# Patient Record
Sex: Female | Born: 1955 | Race: White | Hispanic: No | Marital: Married | Smoking: Never smoker
Health system: Southern US, Community
[De-identification: ages and names within clinical notes are randomized; demographics above are authoritative.]

## PROBLEM LIST (undated history)

## (undated) DIAGNOSIS — IMO0001 Reserved for inherently not codable concepts without codable children: Secondary | ICD-10-CM

## (undated) DIAGNOSIS — E119 Type 2 diabetes mellitus without complications: Secondary | ICD-10-CM

## (undated) DIAGNOSIS — Z9989 Dependence on other enabling machines and devices: Principal | ICD-10-CM

## (undated) DIAGNOSIS — M1990 Unspecified osteoarthritis, unspecified site: Secondary | ICD-10-CM

## (undated) DIAGNOSIS — I10 Essential (primary) hypertension: Secondary | ICD-10-CM

## (undated) DIAGNOSIS — G5602 Carpal tunnel syndrome, left upper limb: Principal | ICD-10-CM

## (undated) DIAGNOSIS — R112 Nausea with vomiting, unspecified: Secondary | ICD-10-CM

## (undated) DIAGNOSIS — G4733 Obstructive sleep apnea (adult) (pediatric): Secondary | ICD-10-CM

## (undated) DIAGNOSIS — I639 Cerebral infarction, unspecified: Secondary | ICD-10-CM

## (undated) DIAGNOSIS — K219 Gastro-esophageal reflux disease without esophagitis: Secondary | ICD-10-CM

## (undated) HISTORY — DX: Carpal tunnel syndrome, left upper limb: G56.02

## (undated) HISTORY — PX: ABDOMINAL HYSTERECTOMY: SHX81

## (undated) HISTORY — PX: APPENDECTOMY: SHX54

## (undated) HISTORY — DX: Cerebral infarction, unspecified: I63.9

## (undated) HISTORY — DX: Obstructive sleep apnea (adult) (pediatric): G47.33

## (undated) HISTORY — PX: TONSILLECTOMY: SUR1361

## (undated) HISTORY — PX: OTHER SURGICAL HISTORY: SHX169

## (undated) HISTORY — DX: Dependence on other enabling machines and devices: Z99.89

## (undated) HISTORY — PX: EYE SURGERY: SHX253

---

## 2012-07-02 ENCOUNTER — Encounter (HOSPITAL_BASED_OUTPATIENT_CLINIC_OR_DEPARTMENT_OTHER): Payer: Self-pay | Admitting: *Deleted

## 2012-07-02 ENCOUNTER — Emergency Department (HOSPITAL_BASED_OUTPATIENT_CLINIC_OR_DEPARTMENT_OTHER)
Admission: EM | Admit: 2012-07-02 | Discharge: 2012-07-02 | Disposition: A | Discharge status: Home / Self Care | Payer: BC Managed Care – PPO | Attending: Emergency Medicine | Admitting: Emergency Medicine

## 2012-07-02 ENCOUNTER — Emergency Department (HOSPITAL_BASED_OUTPATIENT_CLINIC_OR_DEPARTMENT_OTHER): Payer: BC Managed Care – PPO

## 2012-07-02 DIAGNOSIS — Z79899 Other long term (current) drug therapy: Secondary | ICD-10-CM | POA: Insufficient documentation

## 2012-07-02 DIAGNOSIS — E119 Type 2 diabetes mellitus without complications: Secondary | ICD-10-CM | POA: Insufficient documentation

## 2012-07-02 DIAGNOSIS — R1011 Right upper quadrant pain: Secondary | ICD-10-CM | POA: Insufficient documentation

## 2012-07-02 DIAGNOSIS — K219 Gastro-esophageal reflux disease without esophagitis: Secondary | ICD-10-CM | POA: Insufficient documentation

## 2012-07-02 DIAGNOSIS — R197 Diarrhea, unspecified: Secondary | ICD-10-CM | POA: Insufficient documentation

## 2012-07-02 DIAGNOSIS — R110 Nausea: Secondary | ICD-10-CM | POA: Insufficient documentation

## 2012-07-02 HISTORY — DX: Gastro-esophageal reflux disease without esophagitis: K21.9

## 2012-07-02 HISTORY — DX: Reserved for inherently not codable concepts without codable children: IMO0001

## 2012-07-02 HISTORY — DX: Type 2 diabetes mellitus without complications: E11.9

## 2012-07-02 LAB — URINALYSIS, ROUTINE W REFLEX MICROSCOPIC
Leukocytes, UA: NEGATIVE
Protein, ur: NEGATIVE mg/dL
Urobilinogen, UA: 0.2 mg/dL (ref 0.0–1.0)

## 2012-07-02 LAB — COMPREHENSIVE METABOLIC PANEL
ALT: 17 U/L (ref 0–35)
BUN: 13 mg/dL (ref 6–23)
Calcium: 9.7 mg/dL (ref 8.4–10.5)
Creatinine, Ser: 0.9 mg/dL (ref 0.50–1.10)
GFR calc Af Amer: 80 mL/min — ABNORMAL LOW (ref >90)
GFR calc non Af Amer: 69 mL/min — ABNORMAL LOW (ref >90)
Glucose, Bld: 126 mg/dL — ABNORMAL HIGH (ref 70–99)
Sodium: 139 mEq/L (ref 135–145)
Total Protein: 7.2 g/dL (ref 6.0–8.3)

## 2012-07-02 LAB — GLUCOSE, CAPILLARY: Glucose-Capillary: 134 mg/dL — ABNORMAL HIGH (ref 70–99)

## 2012-07-02 LAB — CBC WITH DIFFERENTIAL/PLATELET
Eosinophils Absolute: 0.2 10*3/uL (ref 0.0–0.7)
Eosinophils Relative: 2 % (ref 0–5)
HCT: 45.4 % (ref 36.0–46.0)
Lymphs Abs: 1.2 10*3/uL (ref 0.7–4.0)
MCH: 31.7 pg (ref 26.0–34.0)
MCV: 94.8 fL (ref 78.0–100.0)
Monocytes Absolute: 0.8 10*3/uL (ref 0.1–1.0)
Platelets: 159 10*3/uL (ref 150–400)
RBC: 4.79 MIL/uL (ref 3.87–5.11)

## 2012-07-02 LAB — LIPASE, BLOOD: Lipase: 31 U/L (ref 11–59)

## 2012-07-02 MED ORDER — MORPHINE SULFATE 4 MG/ML IJ SOLN
4.0000 mg | Freq: Once | INTRAMUSCULAR | Status: AC
  Administered 2012-07-02: 4 mg via INTRAVENOUS
  Filled 2012-07-02: qty 1

## 2012-07-02 MED ORDER — HYDROCODONE-ACETAMINOPHEN 5-325 MG PO TABS: 1.0000 | ORAL_TABLET | ORAL | Status: DC | PRN

## 2012-07-02 MED ORDER — SODIUM CHLORIDE 0.9 % IV BOLUS (SEPSIS)
1000.0000 mL | Freq: Once | INTRAVENOUS | Status: AC
  Administered 2012-07-02: 1000 mL via INTRAVENOUS

## 2012-07-02 MED ORDER — ONDANSETRON 4 MG PO TBDP: ORAL_TABLET | ORAL | Status: DC

## 2012-07-02 MED ORDER — ONDANSETRON HCL 4 MG/2ML IJ SOLN
4.0000 mg | Freq: Once | INTRAMUSCULAR | Status: AC
  Administered 2012-07-02: 4 mg via INTRAVENOUS
  Filled 2012-07-02: qty 2

## 2012-07-02 NOTE — ED Notes (Signed)
Mid upper abd pain and diarrhea onset last p.m.  Has taken max on Imodium.

## 2012-07-02 NOTE — ED Provider Notes (Signed)
History  This chart was scribed for Loren Racer, MD by Shari Heritage, ED Scribe. The patient was seen in room MH12/MH12. Patient's care was started at 1901.   CSN: [id number]  Arrival date & time 07/02/12  4098   First MD Initiated Contact with Patient 07/02/12 1901      Chief Complaint  Patient presents with  . Abdominal Pain    Patient is a 57 y.o. female presenting with abdominal pain. The history is provided by the patient. No language interpreter was used.  Abdominal Pain Pain location:  RUQ Pain radiates to:  Does not radiate Pain severity:  Moderate Duration:  1 day Timing:  Constant Progression:  Unchanged Chronicity:  New Relieved by:  Nothing Ineffective treatments:  Bowel activity Associated symptoms: diarrhea and nausea   Associated symptoms: no chills, no fever and no vomiting      HPI Comments: Caroline [name] is a 57 y.o. female with history of diabetes and reflux who presents to the Emergency Department complaining of moderate, unchanged, non-radiating, constant right upper quadrant  abdominal pain onset last night. Patient says that current pain is not similar to past reflux pain. There is associated nausea and diarrhea. Patient states that she has had an episode of diarrhea almost every hours since last night, but last episode was 3 hours ago. There is no blood in stool, fever, chills or vomiting. Pain is not improved after bowel movements. She states that she ate a salad from [facility] last night prior to pain onset. She has a surgical history of abdominal hysterectomy and appendectomy. No history of cholecystectomy. She does not smoke.  Past Medical History  Diagnosis Date  . Diabetes mellitus without complication   . Reflux     Past Surgical History  Procedure Laterality Date  . Abdominal hysterectomy    . Appendectomy      History reviewed. No pertinent family history.  History  Substance Use Topics  . Smoking status: Never Smoker   . Smokeless  tobacco: Not on file  . Alcohol Use: No    OB History   Grav Para Term Preterm Abortions TAB SAB Ect Mult Living                  Review of Systems  Constitutional: Negative for fever and chills.  Gastrointestinal: Positive for nausea, abdominal pain and diarrhea. Negative for vomiting and blood in stool.  All other systems reviewed and are negative.    Allergies  Review of patient's allergies indicates no known allergies.  Home Medications   Current Outpatient Rx  Name  Route  Sig  Dispense  Refill  . estradiol (CLIMARA - DOSED IN MG/24 HR) 0.025 mg/24hr   Transdermal   Place 1 patch onto the skin once a week.         Marland Kitchen omeprazole (PRILOSEC) 40 MG capsule   Oral   Take 40 mg by mouth daily.         . valACYclovir (VALTREX) 500 MG tablet   Oral   Take 500 mg by mouth 2 (two) times daily.           Triage Vitals: BP 122/79  Pulse 118  Temp(Src) 98.3 F (36.8 C) (Oral)  Resp 20  Ht 5' 3.5" (1.613 m)  Wt 187 lb (84.823 kg)  BMI 32.6 kg/m2  SpO2 95%  Physical Exam  Constitutional: She is oriented to person, place, and time. She appears well-developed and well-nourished.  HENT:  Head: Normocephalic and  atraumatic.  Eyes: Conjunctivae and EOM are normal. Pupils are equal, round, and reactive to light.  Neck: Normal range of motion. Neck supple.  Cardiovascular: Normal rate and regular rhythm.   Pulmonary/Chest: Effort normal and breath sounds normal.  Abdominal: Soft. There is tenderness in the right upper quadrant. There is no rebound and no guarding.  Musculoskeletal: Normal range of motion.  Neurological: She is alert and oriented to person, place, and time.  Skin: Skin is warm and dry.  Psychiatric: She has a normal mood and affect. Her behavior is normal.    ED Course  Procedures (including critical care time) DIAGNOSTIC STUDIES: Oxygen Saturation is 95% on room air, adequate by my interpretation.    COORDINATION OF CARE: 7:25 PM- Will give IV  fluids, morphine and Zofran. Will order CBC with diff, comp metabolic panel, lipase, glucose, UA and US abdomen. Patient informed of current plan for treatment and evaluation and agrees with plan at this time.   9:53 PM- Patient is feeling better. Was updated on results of labs and scans. Patient has been reasonably evaluated and is stable for discharge. Patient agrees.     Labs Reviewed  URINALYSIS, ROUTINE W REFLEX MICROSCOPIC - Abnormal; Notable for the following:    Color, Urine AMBER (*)    APPearance CLOUDY (*)    Bilirubin Urine SMALL (*)    All other components within normal limits  GLUCOSE, CAPILLARY - Abnormal; Notable for the following:    Glucose-Capillary 134 (*)    All other components within normal limits  CBC WITH DIFFERENTIAL - Abnormal; Notable for the following:    Hemoglobin 15.2 (*)    All other components within normal limits  COMPREHENSIVE METABOLIC PANEL - Abnormal; Notable for the following:    Glucose, Bld 126 (*)    GFR calc non Af Amer 69 (*)    GFR calc Af Amer 80 (*)    All other components within normal limits  LIPASE, BLOOD    US Abdomen Complete  07/02/2012  *RADIOLOGY REPORT*  Clinical Data:  Right upper quadrant pain.  Abdominal pain and diarrhea.  COMPLETE ABDOMINAL ULTRASOUND  Comparison:  None.  Findings:  Gallbladder:  There is slightly echogenic bile.  No evidence of gallstones.  Gallbladder wall thickness is normal.  No pericholecystic fluid.  No Murphy's sign.  Common bile duct:  Normal at 4 mm.  Liver:  Somewhat echogenic suggesting fatty change.  No focal lesion or biliary ductal dilatation.  IVC:  Normal  Pancreas:  Poorly seen because of overlying bowel gas.  Spleen:  Normal at 8.7 cm.  Right Kidney:  Normal at 10.9 cm.  Normal echogenicity.  No cyst, mass, stone or hydronephrosis.  Left Kidney:  Similarly normal at 10.4 cm.  Abdominal aorta:  Normal.  No aneurysm.  No ascites  IMPRESSION: Probably no significant finding.  Slightly echogenic  bile, usually not pathologic finding.  No sonographic evidence of stones, cholecystitis or obstruction.  Some fatty change of the liver without focal lesion.  Poor visualization of the pancreas because of overlying bowel gas.   Original Report Authenticated By: Paulina Fusi, M.D.      No diagnosis found.    MDM  I personally performed the services described in this documentation, which was scribed in my presence. The recorded information has been reviewed and is accurate.    Loren Racer, MD 07/02/12 [phone]

## 2014-11-01 ENCOUNTER — Other Ambulatory Visit: Payer: Self-pay | Admitting: Neurosurgery

## 2014-11-01 DIAGNOSIS — M5412 Radiculopathy, cervical region: Secondary | ICD-10-CM

## 2014-11-12 ENCOUNTER — Ambulatory Visit
Admission: RE | Admit: 2014-11-12 | Discharge: 2014-11-12 | Disposition: A | Discharge status: Home / Self Care | Payer: 59 | Source: Ambulatory Visit | Attending: Neurosurgery | Admitting: Neurosurgery

## 2014-11-12 DIAGNOSIS — M5412 Radiculopathy, cervical region: Secondary | ICD-10-CM

## 2014-11-12 MED ORDER — TRIAMCINOLONE ACETONIDE 40 MG/ML IJ SUSP (RADIOLOGY)
60.0000 mg | Freq: Once | INTRAMUSCULAR | Status: AC
  Administered 2014-11-12: 60 mg via EPIDURAL

## 2014-11-12 MED ORDER — IOHEXOL 300 MG/ML  SOLN
1.0000 mL | Freq: Once | INTRAMUSCULAR | Status: AC | PRN
  Administered 2014-11-12: 1 mL via EPIDURAL

## 2014-11-12 NOTE — Discharge Instructions (Signed)

## 2016-01-02 ENCOUNTER — Emergency Department (HOSPITAL_COMMUNITY): Payer: BLUE CROSS/BLUE SHIELD

## 2016-01-02 ENCOUNTER — Emergency Department (HOSPITAL_COMMUNITY): Payer: BLUE CROSS/BLUE SHIELD | Admitting: Certified Registered Nurse Anesthetist

## 2016-01-02 ENCOUNTER — Encounter (HOSPITAL_COMMUNITY): Payer: Self-pay | Admitting: Radiology

## 2016-01-02 ENCOUNTER — Encounter (HOSPITAL_COMMUNITY)
Admission: EM | Disposition: A | Discharge status: Home / Self Care | Payer: Self-pay | Source: Home / Self Care | Attending: Neurology

## 2016-01-02 ENCOUNTER — Inpatient Hospital Stay (HOSPITAL_COMMUNITY): Payer: BLUE CROSS/BLUE SHIELD

## 2016-01-02 ENCOUNTER — Inpatient Hospital Stay (HOSPITAL_COMMUNITY)
Admission: EM | Admit: 2016-01-02 | Discharge: 2016-01-08 | DRG: 041 | Disposition: A | Discharge status: Home / Self Care | Payer: BLUE CROSS/BLUE SHIELD | Attending: Neurology | Admitting: Neurology

## 2016-01-02 DIAGNOSIS — R29810 Facial weakness: Secondary | ICD-10-CM | POA: Diagnosis present

## 2016-01-02 DIAGNOSIS — I63412 Cerebral infarction due to embolism of left middle cerebral artery: Principal | ICD-10-CM | POA: Diagnosis present

## 2016-01-02 DIAGNOSIS — Z6830 Body mass index (BMI) 30.0-30.9, adult: Secondary | ICD-10-CM

## 2016-01-02 DIAGNOSIS — R29702 NIHSS score 2: Secondary | ICD-10-CM | POA: Diagnosis present

## 2016-01-02 DIAGNOSIS — E669 Obesity, unspecified: Secondary | ICD-10-CM | POA: Diagnosis present

## 2016-01-02 DIAGNOSIS — E119 Type 2 diabetes mellitus without complications: Secondary | ICD-10-CM | POA: Diagnosis present

## 2016-01-02 DIAGNOSIS — I639 Cerebral infarction, unspecified: Secondary | ICD-10-CM | POA: Diagnosis present

## 2016-01-02 DIAGNOSIS — R4701 Aphasia: Secondary | ICD-10-CM

## 2016-01-02 DIAGNOSIS — I63019 Cerebral infarction due to thrombosis of unspecified vertebral artery: Secondary | ICD-10-CM | POA: Diagnosis not present

## 2016-01-02 DIAGNOSIS — I6789 Other cerebrovascular disease: Secondary | ICD-10-CM | POA: Diagnosis not present

## 2016-01-02 DIAGNOSIS — I10 Essential (primary) hypertension: Secondary | ICD-10-CM | POA: Diagnosis present

## 2016-01-02 DIAGNOSIS — I63312 Cerebral infarction due to thrombosis of left middle cerebral artery: Secondary | ICD-10-CM

## 2016-01-02 DIAGNOSIS — I669 Occlusion and stenosis of unspecified cerebral artery: Secondary | ICD-10-CM | POA: Diagnosis not present

## 2016-01-02 DIAGNOSIS — E785 Hyperlipidemia, unspecified: Secondary | ICD-10-CM | POA: Diagnosis present

## 2016-01-02 DIAGNOSIS — E1159 Type 2 diabetes mellitus with other circulatory complications: Secondary | ICD-10-CM | POA: Diagnosis not present

## 2016-01-02 DIAGNOSIS — I6602 Occlusion and stenosis of left middle cerebral artery: Secondary | ICD-10-CM

## 2016-01-02 DIAGNOSIS — M6281 Muscle weakness (generalized): Secondary | ICD-10-CM

## 2016-01-02 DIAGNOSIS — Q211 Atrial septal defect: Secondary | ICD-10-CM | POA: Diagnosis not present

## 2016-01-02 DIAGNOSIS — Z79890 Hormone replacement therapy (postmenopausal): Secondary | ICD-10-CM

## 2016-01-02 HISTORY — PX: RADIOLOGY WITH ANESTHESIA: SHX6223

## 2016-01-02 HISTORY — PX: IR GENERIC HISTORICAL: IMG1180011

## 2016-01-02 LAB — I-STAT CHEM 8, ED
BUN: 10 mg/dL (ref 6–20)
CREATININE: 0.9 mg/dL (ref 0.44–1.00)
Calcium, Ion: 1.02 mmol/L — ABNORMAL LOW (ref 1.15–1.40)
Chloride: 103 mmol/L (ref 101–111)
GLUCOSE: 154 mg/dL — AB (ref 65–99)
HCT: 48 % — ABNORMAL HIGH (ref 36.0–46.0)
HEMOGLOBIN: 16.3 g/dL — AB (ref 12.0–15.0)
POTASSIUM: 4 mmol/L (ref 3.5–5.1)
Sodium: 138 mmol/L (ref 135–145)
TCO2: 25 mmol/L (ref 0–100)

## 2016-01-02 LAB — CBC
HCT: 47.9 % — ABNORMAL HIGH (ref 36.0–46.0)
HEMOGLOBIN: 15.1 g/dL — AB (ref 12.0–15.0)
MCH: 30.7 pg (ref 26.0–34.0)
MCHC: 31.5 g/dL (ref 30.0–36.0)
MCV: 97.4 fL (ref 78.0–100.0)
Platelets: 169 10*3/uL (ref 150–400)
RBC: 4.92 MIL/uL (ref 3.87–5.11)
RDW: 13.7 % (ref 11.5–15.5)
WBC: 7.2 10*3/uL (ref 4.0–10.5)

## 2016-01-02 LAB — GLUCOSE, CAPILLARY: GLUCOSE-CAPILLARY: 129 mg/dL — AB (ref 65–99)

## 2016-01-02 LAB — COMPREHENSIVE METABOLIC PANEL
ALBUMIN: 3.8 g/dL (ref 3.5–5.0)
ALT: 30 U/L (ref 14–54)
ANION GAP: 11 (ref 5–15)
AST: 27 U/L (ref 15–41)
Alkaline Phosphatase: 63 U/L (ref 38–126)
BILIRUBIN TOTAL: 0.8 mg/dL (ref 0.3–1.2)
BUN: 8 mg/dL (ref 6–20)
CO2: 22 mmol/L (ref 22–32)
Calcium: 9.2 mg/dL (ref 8.9–10.3)
Chloride: 105 mmol/L (ref 101–111)
Creatinine, Ser: 0.89 mg/dL (ref 0.44–1.00)
GFR calc Af Amer: >60 mL/min (ref >60)
Glucose, Bld: 158 mg/dL — ABNORMAL HIGH (ref 65–99)
POTASSIUM: 3.8 mmol/L (ref 3.5–5.1)
Sodium: 138 mmol/L (ref 135–145)
TOTAL PROTEIN: 7 g/dL (ref 6.5–8.1)

## 2016-01-02 LAB — CBG MONITORING, ED
GLUCOSE-CAPILLARY: 117 mg/dL — AB (ref 65–99)
Glucose-Capillary: 190 mg/dL — ABNORMAL HIGH (ref 65–99)

## 2016-01-02 LAB — DIFFERENTIAL
BASOS PCT: 0 %
Basophils Absolute: 0 10*3/uL (ref 0.0–0.1)
EOS ABS: 0.3 10*3/uL (ref 0.0–0.7)
EOS PCT: 4 %
Lymphocytes Relative: 25 %
Lymphs Abs: 1.8 10*3/uL (ref 0.7–4.0)
MONOS PCT: 9 %
Monocytes Absolute: 0.6 10*3/uL (ref 0.1–1.0)
NEUTROS PCT: 62 %
Neutro Abs: 4.6 10*3/uL (ref 1.7–7.7)

## 2016-01-02 LAB — URINALYSIS, ROUTINE W REFLEX MICROSCOPIC
Bilirubin Urine: NEGATIVE
GLUCOSE, UA: NEGATIVE mg/dL
Ketones, ur: 15 mg/dL — AB
Leukocytes, UA: NEGATIVE
Nitrite: NEGATIVE
PH: 6 (ref 5.0–8.0)
Protein, ur: NEGATIVE mg/dL
SPECIFIC GRAVITY, URINE: 1.046 — AB (ref 1.005–1.030)

## 2016-01-02 LAB — RAPID URINE DRUG SCREEN, HOSP PERFORMED
Amphetamines: NOT DETECTED
Barbiturates: NOT DETECTED
Benzodiazepines: NOT DETECTED
Cocaine: NOT DETECTED
OPIATES: NOT DETECTED
Tetrahydrocannabinol: NOT DETECTED

## 2016-01-02 LAB — I-STAT TROPONIN, ED: TROPONIN I, POC: 0 ng/mL (ref 0.00–0.08)

## 2016-01-02 LAB — PROTIME-INR
INR: 0.98
Prothrombin Time: 13 seconds (ref 11.4–15.2)

## 2016-01-02 LAB — URINE MICROSCOPIC-ADD ON

## 2016-01-02 LAB — APTT: aPTT: 24 seconds (ref 24–36)

## 2016-01-02 SURGERY — RADIOLOGY WITH ANESTHESIA
Anesthesia: General

## 2016-01-02 MED ORDER — SODIUM CHLORIDE 0.9 % IV SOLN
INTRAVENOUS | Status: DC
  Administered 2016-01-03: 1000 mL via INTRAVENOUS

## 2016-01-02 MED ORDER — ONDANSETRON HCL 4 MG/2ML IJ SOLN
INTRAMUSCULAR | Status: DC | PRN
  Administered 2016-01-02: 4 mg via INTRAVENOUS

## 2016-01-02 MED ORDER — STROKE: EARLY STAGES OF RECOVERY BOOK
Freq: Once | Status: AC
  Administered 2016-01-02: 19:00:00
  Filled 2016-01-02: qty 1

## 2016-01-02 MED ORDER — LIDOCAINE HCL 1 % IJ SOLN
INTRAMUSCULAR | Status: AC
  Filled 2016-01-02: qty 20

## 2016-01-02 MED ORDER — INSULIN ASPART 100 UNIT/ML ~~LOC~~ SOLN
0.0000 [IU] | Freq: Three times a day (TID) | SUBCUTANEOUS | Status: DC
  Administered 2016-01-03 – 2016-01-08 (×11): 2 [IU] via SUBCUTANEOUS
  Administered 2016-01-08: 3 [IU] via SUBCUTANEOUS

## 2016-01-02 MED ORDER — HEPARIN SODIUM (PORCINE) 1000 UNIT/ML IJ SOLN
INTRAMUSCULAR | Status: DC | PRN
  Administered 2016-01-02: 1000 [IU] via INTRAVENOUS

## 2016-01-02 MED ORDER — HYDROMORPHONE HCL 1 MG/ML IJ SOLN: 0.5000 mg | INTRAMUSCULAR | Status: DC | PRN

## 2016-01-02 MED ORDER — ENOXAPARIN SODIUM 40 MG/0.4ML ~~LOC~~ SOLN
40.0000 mg | SUBCUTANEOUS | Status: DC
  Administered 2016-01-02 – 2016-01-07 (×6): 40 mg via SUBCUTANEOUS
  Filled 2016-01-02 (×6): qty 0.4

## 2016-01-02 MED ORDER — ASPIRIN EC 325 MG PO TBEC
325.0000 mg | DELAYED_RELEASE_TABLET | Freq: Every day | ORAL | Status: DC
  Administered 2016-01-03: 325 mg via ORAL
  Filled 2016-01-02: qty 1

## 2016-01-02 MED ORDER — SODIUM CHLORIDE 0.9 % IV SOLN
INTRAVENOUS | Status: DC | PRN
  Administered 2016-01-02: 15:00:00 via INTRAVENOUS

## 2016-01-02 MED ORDER — IOPAMIDOL (ISOVUE-370) INJECTION 76%
50.0000 mL | Freq: Once | INTRAVENOUS | Status: AC | PRN
  Administered 2016-01-02: 50 mL via INTRAVENOUS

## 2016-01-02 MED ORDER — SODIUM CHLORIDE 0.9 % IV SOLN
INTRAVENOUS | Status: DC
Start: 2016-01-02 — End: 2016-01-03
  Administered 2016-01-02 – 2016-01-03 (×2): via INTRAVENOUS

## 2016-01-02 MED ORDER — IOPAMIDOL (ISOVUE-300) INJECTION 61%
INTRAVENOUS | Status: AC
  Administered 2016-01-02: 50 mL
  Filled 2016-01-02: qty 150

## 2016-01-02 MED ORDER — PROPOFOL 10 MG/ML IV BOLUS
INTRAVENOUS | Status: DC | PRN
  Administered 2016-01-02: 30 mg via INTRAVENOUS

## 2016-01-02 MED ORDER — IOPAMIDOL (ISOVUE-370) INJECTION 76%
INTRAVENOUS | Status: AC
  Filled 2016-01-02: qty 50

## 2016-01-02 MED ORDER — ONDANSETRON HCL 4 MG/2ML IJ SOLN: 4.0000 mg | Freq: Once | INTRAMUSCULAR | Status: DC | PRN

## 2016-01-02 MED ORDER — FENTANYL CITRATE (PF) 100 MCG/2ML IJ SOLN
INTRAMUSCULAR | Status: DC | PRN
  Administered 2016-01-02 (×4): 50 ug via INTRAVENOUS

## 2016-01-02 NOTE — Progress Notes (Signed)
Pt arrived to 5C20 via stretcher.  Pt alert and oriented, no complaints of pain.  VSS.  Pt lying flat in bed, R groin dressing clean,dry and intact.  Pt informed of the need to remain flat until 8 pm.  Patient NPO, swallow screen not done.  Pt wants to wait until she can sit up to do it.  Will pass to oncoming nurse.  Will continue to monitor.  Sondra ComeSilva, Sadiyah Kangas M, RN

## 2016-01-02 NOTE — Procedures (Signed)
4 vessel cerebral arteriogram. RT CFA approach. Findings. 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

## 2016-01-02 NOTE — Anesthesia Procedure Notes (Signed)
Procedure Name: MAC Date/Time: 01/02/2016 2:47 PM Performed by: Wray KearnsFOLEY, Siearra Amberg A Pre-anesthesia Checklist: Patient identified, Emergency Drugs available, Suction available, Patient being monitored and Timeout performed Patient Re-evaluated:Patient Re-evaluated prior to inductionOxygen Delivery Method: Nasal cannula Intubation Type: IV induction Placement Confirmation: positive ETCO2 Dental Injury: Teeth and Oropharynx as per pre-operative assessment

## 2016-01-02 NOTE — Anesthesia Postprocedure Evaluation (Signed)
Anesthesia Post Note  Patient: Caroline [name]  Procedure(s) Performed: Procedure(s) (LRB): RADIOLOGY WITH ANESTHESIA (N/A)  Patient location during evaluation: PACU Anesthesia Type: MAC Level of consciousness: awake, awake and alert, oriented and patient cooperative Pain management: pain level controlled Vital Signs Assessment: post-procedure vital signs reviewed and stable Respiratory status: spontaneous breathing and respiratory function stable Cardiovascular status: stable Anesthetic complications: no    Last Vitals:  Vitals:   01/02/16 1415 01/02/16 1432  BP:  130/83  Pulse:  63  Resp: 22 15  Temp:      Last Pain:  Vitals:   01/02/16 1231  TempSrc: Oral  PainSc:                  Johnathan Heskett EDWARD

## 2016-01-02 NOTE — ED Triage Notes (Signed)
Pt. Husband left around 7AM and around 1030 the secretary noticed that the patient was having some aphasia.

## 2016-01-02 NOTE — Anesthesia Preprocedure Evaluation (Signed)
Anesthesia Evaluation  Patient identified by MRN, date of birth, ID band Patient awake    Reviewed: Allergy & Precautions, NPO status , Patient's Chart, lab work & pertinent test results  Airway Mallampati: I  TM Distance: >3 FB     Dental   Pulmonary    Pulmonary exam normal        Cardiovascular Normal cardiovascular exam     Neuro/Psych CVA    GI/Hepatic   Endo/Other  diabetes, Well Controlled  Renal/GU      Musculoskeletal   Abdominal   Peds  Hematology   Anesthesia Other Findings   Reproductive/Obstetrics                             Anesthesia Physical Anesthesia Plan  ASA: III  Anesthesia Plan: MAC   Post-op Pain Management:    Induction: Intravenous  Airway Management Planned: Mask and Simple Face Mask  Additional Equipment:   Intra-op Plan:   Post-operative Plan:   Informed Consent: I have reviewed the patients History and Physical, chart, labs and discussed the procedure including the risks, benefits and alternatives for the proposed anesthesia with the patient or authorized representative who has indicated his/her understanding and acceptance.     Plan Discussed with: CRNA, Anesthesiologist and Surgeon  Anesthesia Plan Comments:         Anesthesia Quick Evaluation

## 2016-01-02 NOTE — Transfer of Care (Signed)
Immediate Anesthesia Transfer of Care Note  Patient: Caroline [name]  Procedure(s) Performed: Procedure(s): RADIOLOGY WITH ANESTHESIA (N/A)  Patient Location: PACU  Anesthesia Type:MAC  Level of Consciousness: awake, oriented, patient cooperative and responds to stimulation  Airway & Oxygen Therapy: Patient Spontanous Breathing  Post-op Assessment: Report given to RN, Post -op Vital signs reviewed and stable, Patient moving all extremities and Patient moving all extremities X 4  Post vital signs: Reviewed and stable  Last Vitals:  Vitals:   01/02/16 1415 01/02/16 1432  BP:  130/83  Pulse:  63  Resp: 22 15  Temp:      Last Pain:  Vitals:   01/02/16 1231  TempSrc: Oral  PainSc:          Complications: No apparent anesthesia complications

## 2016-01-02 NOTE — Code Documentation (Signed)
60[facility].  Patient from home when she was LKW at 0700 by her husband.  Patient works from home, and Environmental consultantthe secretary noticed at 1030 that she was confused and not making any sense.  EMS was called and activated a code stroke for expressive aphasia.  Stroke team at the bedside on patient arrival.  Patient to CT with team.  Phlebotomy to CT to draw labs.  CT completed.  NIHSS 2, see documentation for details and code stroke times.  Patient with mild expressive aphasia, able to name objects with occasional word substitution as well as described the picture with some loss of fluency.  Patient with mild right facial droop.  CTA imaging completed per MD order.  Patient transported back to the ED.  Patient is outside the window for treatment with tPA.  Dr. Amada JupiterKirkpatrick updated patient and family at the bedside.  CTP imaging ordered.  Unable to place 18G PIV despite multiple attempts by providers and use of ultrasound.  Patient to go to STAT MRI.  Bedside handoff with ED RN Kirt BoysMolly.

## 2016-01-02 NOTE — ED Provider Notes (Signed)
[facility]-EMERGENCY DEPT Provider Note   CSN: [id number] Arrival date & time: 01/02/16  1141     History   Chief Complaint Chief Complaint  Patient presents with  . Code Stroke    HPI Caroline [name] is a 60 y.o. female.  HPI  60 year old female who presents as a code stroke. History of diabetes. States that over the past 2 days she has had nausea, vomiting, and diarrhea. States that she woke up this morning at 10 AM and did not feel right. States that she was having difficulty breathing. States that she went to bed at midnight the night before and was her normal self other than her GI illness. According to EMS, her husband woke up at 7 AM and felt that she was her normal self. Patient works from home, on around 10:30 AM her secretary felt that she was not talking correctly. She had jumbling of her words and difficulty with word finding. Patient denies any numbness, weakness focally, vision changes, chest pain, difficulty breathing, abdominal pain or severe back pain.   Past Medical History:  Diagnosis Date  . Diabetes mellitus without complication (HCC)   . Reflux     There are no active problems to display for this patient.   Past Surgical History:  Procedure Laterality Date  . ABDOMINAL HYSTERECTOMY    . APPENDECTOMY      OB History    No data available       Home Medications    Prior to Admission medications   Medication Sig Start Date End Date Taking? Authorizing Provider  estradiol (CLIMARA - DOSED IN MG/24 HR) 0.025 mg/24hr Place 1 patch onto the skin once a week.    Historical Provider, MD  HYDROcodone-acetaminophen (NORCO) 5-325 MG per tablet Take 1 tablet by mouth every 4 (four) hours as needed for pain. 07/02/12   Loren Racer, MD  omeprazole (PRILOSEC) 40 MG capsule Take 40 mg by mouth daily.    Historical Provider, MD  ondansetron (ZOFRAN ODT) 4 MG disintegrating tablet 4mg  ODT q4 hours prn nausea/vomit 07/02/12   Loren Racer, MD  valACYclovir (VALTREX)  500 MG tablet Take 500 mg by mouth 2 (two) times daily.    Historical Provider, MD    Family History No family history on file.  Social History Social History  Substance Use Topics  . Smoking status: Never Smoker  . Smokeless tobacco: Never Used  . Alcohol use No     Allergies   Review of patient's allergies indicates no known allergies.   Review of Systems Review of Systems 10/14 systems reviewed and are negative other than those stated in the HPI   Physical Exam Updated Vital Signs BP 130/83 (BP Location: Right Arm)   Pulse 63   Temp 97.6 F (36.4 C) (Oral)   Resp 15   SpO2 99%   Physical Exam Physical Exam  Nursing note and vitals reviewed. Constitutional: Well developed, well nourished, non-toxic, and in no acute distress Head: Normocephalic and atraumatic.  Mouth/Throat: Oropharynx is clear and moist.  Neck: Normal range of motion. Neck supple.  Eyes: PERRL, EOMI Cardiovascular: Normal rate and regular rhythm.   Pulmonary/Chest: Effort normal and breath sounds normal.  Abdominal: Soft. There is no tenderness. There is no rebound and no guarding.  Musculoskeletal: Normal range of motion.  Neurological: Alert, no facial droop, expressive aphasia, no dysmetria with finger to nose, full strength of bilaterally upper extremities against gravity, potential subtle 5-/5 strength in RLE against gravity, from LLE  strength against gravity. Sensation to light touch intact in bilateral upper and lower extremities and face, palate move symmetrically, pupils equal reactive to light, extraocular movements intact, temperature is midline Skin: Skin is warm and dry.  Psychiatric: Cooperative   ED Treatments / Results  Labs (all labs ordered are listed, but only abnormal results are displayed) Labs Reviewed  CBC - Abnormal; Notable for the following:       Result Value   Hemoglobin 15.1 (*)    HCT 47.9 (*)    All other components within normal limits  COMPREHENSIVE  METABOLIC PANEL - Abnormal; Notable for the following:    Glucose, Bld 158 (*)    All other components within normal limits  I-STAT CHEM 8, ED - Abnormal; Notable for the following:    Glucose, Bld 154 (*)    Calcium, Ion 1.02 (*)    Hemoglobin 16.3 (*)    HCT 48.0 (*)    All other components within normal limits  CBG MONITORING, ED - Abnormal; Notable for the following:    Glucose-Capillary 190 (*)    All other components within normal limits  PROTIME-INR  APTT  DIFFERENTIAL  ETHANOL  URINE RAPID DRUG SCREEN, HOSP PERFORMED  URINALYSIS, ROUTINE W REFLEX MICROSCOPIC (NOT AT [facility])  I-STAT TROPOININ, ED    EKG  EKG Interpretation  Date/Time:  Thursday January 02 2016 12:16:25 EDT Ventricular Rate:  66 PR Interval:    QRS Duration: 91 QT Interval:  432 QTC Calculation: 436 R Axis:   81 Text Interpretation:  Sinus rhythm Supraventricular bigeminy Borderline right axis deviation Low voltage, extremity and precordial leads Minimal ST elevation, inferior leads no prior EKg  Confirmed by LIU MD, DANA [phone]) on 01/02/2016 2:50:32 PM       Radiology Ct Angio Head W Or Wo Contrast  Result Date: 01/02/2016 CLINICAL DATA:  60 year old diabetic female with dysphaia. Subsequent encounter. EXAM: CT ANGIOGRAPHY HEAD AND NECK TECHNIQUE: Multidetector CT imaging of the head and neck was performed using the standard protocol during bolus administration of intravenous contrast. Multiplanar CT image reconstructions and MIPs were obtained to evaluate the vascular anatomy. Carotid stenosis measurements (when applicable) are obtained utilizing NASCET criteria, using the distal internal carotid diameter as the denominator. CONTRAST:  50 cc Isovue 370. COMPARISON:  None. FINDINGS: CT HEAD Brain: Possible early infarct left subinsular/ operculum region. No intracranial hemorrhage. No intracranial enhancing lesion. Calvarium and skull base: Negative Paranasal sinuses: Partial opacification inferior left  maxillary sinus Orbits: No acute orbital abnormality. CTA NECK Aortic arch: 4 vessel arch with left vertebral artery arising directly from the arch. Right carotid system: No significant stenosis. Left carotid system: No significant stenosis. Vertebral arteries:Left vertebral artery is dominant. Skeleton: Negative. Other neck: No worrisome mass. Upper chest: No worrisome lung apical lesion. CTA HEAD Anterior circulation: No significant stenosis left carotid terminus or M1 segment of the left middle cerebral artery. 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 Fetal origin of the posterior cerebral arteries. Posterior circulation: Right vertebral artery ends in a posterior inferior cerebellar artery distribution. Venous sinuses: Patent Anatomic variants: As above Delayed phase: As above IMPRESSION: CT HEAD Possible early infarct left subinsular/ operculum region. CTA HEAD No significant stenosis left carotid terminus or M1 segment of the left middle cerebral artery. 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 Fetal origin of posterior cerebral arteries. Right vertebral artery ends in a posterior inferior cerebellar artery  distribution. CTA NECK 4 vessel arch with left vertebral artery arising directly from the arch. No significant stenosis of either carotid bifurcation. Images reviewed with Dr. Amada Jupiter at the time of scanning. Electronically Signed   By: Lacy Duverney M.D.   On: 01/02/2016 12:31   Ct Angio Neck W Or Wo Contrast  Result Date: 01/02/2016 CLINICAL DATA:  60 year old diabetic female with dysphaia. Subsequent encounter. EXAM: CT ANGIOGRAPHY HEAD AND NECK TECHNIQUE: Multidetector CT imaging of the head and neck was performed using the standard protocol during bolus administration of intravenous contrast. Multiplanar CT image reconstructions and MIPs were obtained to evaluate the vascular  anatomy. Carotid stenosis measurements (when applicable) are obtained utilizing NASCET criteria, using the distal internal carotid diameter as the denominator. CONTRAST:  50 cc Isovue 370. COMPARISON:  None. FINDINGS: CT HEAD Brain: Possible early infarct left subinsular/ operculum region. No intracranial hemorrhage. No intracranial enhancing lesion. Calvarium and skull base: Negative Paranasal sinuses: Partial opacification inferior left maxillary sinus Orbits: No acute orbital abnormality. CTA NECK Aortic arch: 4 vessel arch with left vertebral artery arising directly from the arch. Right carotid system: No significant stenosis. Left carotid system: No significant stenosis. Vertebral arteries:Left vertebral artery is dominant. Skeleton: Negative. Other neck: No worrisome mass. Upper chest: No worrisome lung apical lesion. CTA HEAD Anterior circulation: No significant stenosis left carotid terminus or M1 segment of the left middle cerebral artery. 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 Fetal origin of the posterior cerebral arteries. Posterior circulation: Right vertebral artery ends in a posterior inferior cerebellar artery distribution. Venous sinuses: Patent Anatomic variants: As above Delayed phase: As above IMPRESSION: CT HEAD Possible early infarct left subinsular/ operculum region. CTA HEAD No significant stenosis left carotid terminus or M1 segment of the left middle cerebral artery. 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 Fetal origin of posterior cerebral arteries. Right vertebral artery ends in a posterior inferior cerebellar artery distribution. CTA NECK 4 vessel arch with left vertebral artery arising directly from the arch. No significant stenosis of either carotid bifurcation. Images reviewed with Dr. Amada Jupiter at the time of scanning. Electronically Signed   By: Lacy Duverney  M.D.   On: 01/02/2016 12:31   Mr Brain Wo Contrast  Result Date: 01/02/2016 CLINICAL DATA:  60 year old diabetic female with dysphasia. Subsequent encounter. EXAM: MRI HEAD WITHOUT CONTRAST TECHNIQUE: Multiplanar, multiecho pulse sequences of the brain and surrounding structures were obtained without intravenous contrast. COMPARISON:  CT angiogram head and neck same date. FINDINGS: Present exam was performed in an abbreviated fashion per request. Brain: Small to slightly moderate-size acute left parietal lobe infarct. There is a region of blood breakdown products within sulcus adjacent to the acute infarct which may represent clot within left middle cerebral artery branch vessel with slow flow/no flow in left middle cerebral artery vessels beyond this region. Mild chronic microvascular changes. No hydrocephalus. No intracranial mass lesion noted on this unenhanced exam. IMPRESSION: Present exam was performed in an abbreviated fashion per request. Small to slightly moderate-size acute left parietal lobe infarct. There is a region of blood breakdown products within sulcus adjacent to the acute infarct which may represent clot within left middle cerebral artery branch vessel with slow flow/no flow in left middle cerebral artery vessels beyond this region. Mild chronic microvascular changes. These results were called by telephone at the time of interpretation on 01/02/2016 at 2:29 pm to Dr. Ritta Slot , who  verbally acknowledged these results. Electronically Signed   By: Lacy DuverneySteven  Olson M.D.   On: 01/02/2016 14:29   Ct Head Code Stroke W/o Cm  Result Date: 01/02/2016 CLINICAL DATA:  Code stroke. EXAM: CT HEAD WITHOUT CONTRAST TECHNIQUE: Contiguous axial images were obtained from the base of the skull through the vertex without intravenous contrast. COMPARISON:  None. Asymmetric hypodensity affects the LEFT insula, and LEFT temporal cortex consistent with acute infarction. There is a dense LEFT MCA M2 or M3  branch in the sylvian fissure (image 12 series 201). Asymmetric but more subtle hyperdensity of the LEFT ICA terminus as seen on image 29 series 203 coronal and LEFT M1 MCA as seen on axial image 10 could signify a more proximal thrombosis. No hemorrhage, mass lesion, hydrocephalus, or extra-axial fluid. Normal for age cerebral volume. Suspected mild chronic microvascular ischemic change. Old LEFT parietal cortical infarct as seen on image 18. Calvarium intact.  No sinus or mastoid disease. ASPECTS [facility]([place] Stroke Program Early CT Score) - Ganglionic level infarction (caudate, lentiform nuclei, internal capsule, insula, M1-M3 cortex): 5 - Supraganglionic infarction (M4-M6 cortex): 3 Total score (0-10 with 10 being normal): 8 IMPRESSION: 1. Acute LEFT hemisphere affecting the insula and LEFT temporal lobe. 2. ASPECTS is 8. 3. Hyperdense LEFT MCA M2/M3 vessel in the sylvian fissure. Subtle asymmetric hyperdensity of the LEFT ICA terminus and LEFT M1 MCA could signify a more proximal thrombosis. CTA head/neck is pending. These results were called by telephone at the time of interpretation on 01/02/2016 at 12:00 pm to Dr. Amada JupiterKirkpatrick, who verbally acknowledged these results. Electronically Signed   By: Elsie StainJohn T Curnes M.D.   On: 01/02/2016 12:07    Procedures Procedures (including critical care time)   CRITICAL CARE Performed by: Lavera Guiseana Duo Liu   Total critical care time: 35 minutes  Critical care time was exclusive of separately billable procedures and treating other patients.  Critical care was necessary to treat or prevent imminent or life-threatening deterioration.  Critical care was time spent personally by me on the following activities: development of treatment plan with patient and/or surrogate as well as nursing, discussions with consultants, evaluation of patient's response to treatment, examination of patient, obtaining history from patient or surrogate, ordering and performing treatments and  interventions, ordering and review of laboratory studies, ordering and review of radiographic studies, pulse oximetry and re-evaluation of patient's condition.   Medications Ordered in ED Medications  iopamidol (ISOVUE-370) 76 % injection (not administered)  iopamidol (ISOVUE-370) 76 % injection 50 mL (50 mLs Intravenous Contrast Given 01/02/16 1159)     Initial Impression / Assessment and Plan / ED Course  I have reviewed the triage vital signs and the nursing notes.  Pertinent labs & imaging results that were available during my care of the patient were reviewed by me and considered in my medical decision making (see chart for details).  Clinical Course    Presenting as code stroke. Dr. Amada JupiterKirkpatrick saw patient at bedside. CT head concerning for left MCA stroke, and patient was felt to be a good candidate for interventional angiogram. She has mild deficits currently, primarily expressive aphasia. Given significant area of stroke, emergent MRI performed to look for reversible penumbra. Patient felt to be good interventional candidate by neurology. Subsequently transferred for angiogram and admitted to neurology service afterwards.  Final Clinical Impressions(s) / ED Diagnoses   Final diagnoses:  CVA (cerebral infarction)  CVA (cerebral infarction)  Aphasia  Stroke [facility](HCC)    New Prescriptions New Prescriptions  No medications on file     Lavera Guise, MD 01/02/16 [phone]

## 2016-01-02 NOTE — H&P (Addendum)
Neurology H&P  CC: Aphasia  History is obtained from: Patient  HPI: Caroline [name] is a 60 y.o. female with a history of diabetes, hypertension who presents with aphasia that started this morning. Her husband saw her around 7 AM, then when a coworker came by her house a little while later she was found to be aphasic. A code stroke was called and I met her at the bridge. She was brought into the emergency room after in the IV TPA window had elapsed, and due to her mild symptoms was not initially considered for intra-arterial therapy. CT angiogram however did show what was initially thought to be a fairly large occlusion and given her mild symptoms and was thought she might have some tissue still at risk. She had some waxing and waning. An MRI was performed which showed a relatively small infarct. She was taken for angiogram which demonstrated a lesion that was not amenable to mechanical thrombectomy and therefore no further action was taken.   LKW: 7 AM tpa given?: no, outside of window   ROS: A 14 point ROS was performed and is negative except as noted in the HPI.   Past Medical History:  Diagnosis Date  . Diabetes mellitus without complication ([place])   . Reflux      Family history: No history of similar   Social History:  reports that she has never smoked. She has never used smokeless tobacco. She reports that she does not drink alcohol or use drugs.   Exam: Current vital signs: BP 132/78 (BP Location: Right Arm)   Pulse 63   Temp 97.5 F (36.4 C)   Resp 15   SpO2 99%  Vital signs in last 24 hours: Temp:  [97.5 F (36.4 C)-97.6 F (36.4 C)] 97.5 F (36.4 C) (09/14 1545) Pulse Rate:  [62-73] 63 (09/14 1432) Resp:  [14-22] 15 (09/14 1432) BP: (130-132)/(78-83) 132/78 (09/14 1545) SpO2:  [99 %-100 %] 99 % (09/14 1432)  Physical Exam  Constitutional: Appears well-developed and well-nourished.  Psych: Affect appropriate to situation Eyes: No scleral injection HENT: No OP  obstrucion Head: Normocephalic.  Cardiovascular: Normal rate and regular rhythm.  Respiratory: Effort normal and breath sounds normal to anterior ascultation GI: Soft.  No distension. There is no tenderness.  Skin: WDI  Neuro: Mental Status: Patient is awake, alert, oriented to person, place, month, year, and situation. Patient is able to give a clear and coherent history. No signs of neglect. She has a mild aphasia Cranial Nerves: II: Visual Fields are full. Pupils are equal, round, and reactive to light.   III,IV, VI: EOMI without ptosis or diploplia.  V: Facial sensation is symmetric to temperature VII: Facial movement is notable for very rough mild right facial droop VIII: hearing is intact to voice X: Uvula elevates symmetrically XI: Shoulder shrug is symmetric. XII: tongue is midline without atrophy or fasciculations.  Motor: Tone is normal. Bulk is normal. 5/5 strength was present in all four extremities.  Sensory: Sensation is symmetric to light touch and temperature in the arms and legs. Cerebellar: FNF and HKS are intact bilaterally   I have reviewed labs in epic and the results pertinent to this consultation are: CMP-borderline glucose  I have reviewed the images obtained: CT head-possible mild changes, CTA head-appear to be a relatively large vessel occluded, MRI brain-small area of infarction  Impression: 60 year old female with infarct, my suspicion is that this would likely be an embolic event. She is being admitted for therapy and  further workup.  Recommendations: 1. HgbA1c, fasting lipid panel 2. MRI, MRA  of the brain without contrast 3. Frequent neuro checks 4. Echocardiogram 5. Carotid dopplers 6. Prophylactic therapy-Antiplatelet med: Aspirin - dose 398m PO or 3045mPR 7. Risk factor modification 8. Telemetry monitoring 9. PT consult, OT consult, Speech consult 10. SSI for DM 11. Stroke to follow.   This patient is critically ill and at  significant risk of neurological worsening, death and care requires constant monitoring of vital signs, hemodynamics,respiratory and cardiac monitoring, neurological assessment, discussion with family, other specialists and medical decision making of high complexity. I spent 45 minutes of neurocritical care time  in the care of  this patient.  McRoland RackMD [facility] [phone] 7pm- 7am, please page neurology on call as listed in [place]9/14/2017  4:32 PM

## 2016-01-03 ENCOUNTER — Encounter (HOSPITAL_COMMUNITY): Payer: Self-pay | Admitting: Radiology

## 2016-01-03 DIAGNOSIS — I63412 Cerebral infarction due to embolism of left middle cerebral artery: Principal | ICD-10-CM

## 2016-01-03 DIAGNOSIS — E1159 Type 2 diabetes mellitus with other circulatory complications: Secondary | ICD-10-CM

## 2016-01-03 DIAGNOSIS — I10 Essential (primary) hypertension: Secondary | ICD-10-CM

## 2016-01-03 LAB — GLUCOSE, CAPILLARY
GLUCOSE-CAPILLARY: 144 mg/dL — AB (ref 65–99)
GLUCOSE-CAPILLARY: 128 mg/dL — AB (ref 65–99)
GLUCOSE-CAPILLARY: 122 mg/dL — AB (ref 65–99)
GLUCOSE-CAPILLARY: 149 mg/dL — AB (ref 65–99)

## 2016-01-03 LAB — LIPID PANEL
CHOL/HDL RATIO: 3.9 ratio
CHOLESTEROL: 136 mg/dL (ref 0–200)
HDL: 35 mg/dL — AB (ref >40)
LDL Cholesterol: 68 mg/dL (ref 0–99)
TRIGLYCERIDES: 163 mg/dL — AB (ref <150)
VLDL: 33 mg/dL (ref 0–40)

## 2016-01-03 MED ORDER — ATORVASTATIN CALCIUM 10 MG PO TABS
10.0000 mg | ORAL_TABLET | Freq: Every day | ORAL | Status: DC
  Administered 2016-01-03 – 2016-01-07 (×5): 10 mg via ORAL
  Filled 2016-01-03 (×5): qty 1

## 2016-01-03 MED ORDER — CLOPIDOGREL BISULFATE 75 MG PO TABS
75.0000 mg | ORAL_TABLET | Freq: Every day | ORAL | Status: DC
  Administered 2016-01-04 – 2016-01-08 (×5): 75 mg via ORAL
  Filled 2016-01-03 (×5): qty 1

## 2016-01-03 MED ORDER — ATORVASTATIN CALCIUM 10 MG PO TABS: 10.0000 mg | ORAL_TABLET | Freq: Every day | ORAL | Status: DC

## 2016-01-03 MED ORDER — ACETAMINOPHEN 500 MG PO TABS
1000.0000 mg | ORAL_TABLET | Freq: Four times a day (QID) | ORAL | Status: DC | PRN
Start: 2016-01-03 — End: 2016-01-08
  Administered 2016-01-03 – 2016-01-06 (×8): 1000 mg via ORAL
  Filled 2016-01-03 (×9): qty 2

## 2016-01-03 NOTE — Evaluation (Signed)
Physical Therapy Evaluation Patient Details Name: Caroline [name] MRN: [id number] DOB: 1955/06/29 Today's Date: 01/03/2016   History of Present Illness  Patient is a 60 y/o female with hx of DM and HTN who presents with aphasia. Head CT-Acute LEFT hemisphere affecting the insula and LEFT temporal lobe. MRI- + Small to slightly moderate-size acute left parietal lobe infarct. 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 Requires close min guard during gait training due to imbalance and weakness. Pt independent PTA. Will have support from daughter at discharge. Will follow acutely to maximize independence and mobility prior to return home.    Follow Up Recommendations Outpatient PT;Supervision - Intermittent (pending improvement)    Equipment Recommendations  None recommended by PT    Recommendations for Other Services       Precautions / Restrictions Precautions Precautions: Fall Restrictions Weight Bearing Restrictions: No      Mobility  Bed Mobility Overal bed mobility: Needs Assistance Bed Mobility: Supine to Sit     Supine to sit: Supervision;HOB elevated     General bed mobility comments: Use of rail, no physical assist needed.  Transfers Overall transfer level: Needs assistance Equipment used: None Transfers: Sit to/from Stand Sit to Stand: Min guard         General transfer comment: Min guard to steady in standing as pt reports some lightheadedness. No LOB but reaching for IV pole for support.  Ambulation/Gait Ambulation/Gait assistance: Min guard;Min assist Ambulation Distance (Feet): 120 Feet Assistive device: None Gait Pattern/deviations: Step-through pattern;Decreased stride length;Drifts right/left Gait velocity: decreased Gait velocity interpretation: Below normal speed for age/gender General Gait Details: Slow, guarded  gait with drifting noted at times- improved with increased distance.   Stairs            Wheelchair Mobility    Modified Rankin (Stroke Patients Only) Modified Rankin (Stroke Patients Only) Pre-Morbid Rankin Score: No symptoms Modified Rankin: Moderate disability     Balance Overall balance assessment: Needs assistance Sitting-balance support: Feet supported;No upper extremity supported Sitting balance-Leahy Scale: Good     Standing balance support: During functional activity Standing balance-Leahy Scale: Fair                               Pertinent Vitals/Pain Pain Assessment: Faces Faces Pain Scale: Hurts little more Pain Location: headache post ambulation Pain Descriptors / Indicators: Headache Pain Intervention(s): Monitored during session;Patient requesting pain meds-RN notified    Home Living Family/patient expects to be discharged to:: Private residence Living Arrangements: Spouse/significant other Available Help at Discharge: Family;Available 24 hours/day Type of Home: House Home Access: Level entry     Home Layout: One level Home Equipment: Walker - 2 wheels;Cane - single point;Bedside commode;Hospital bed;Wheelchair - manual      Prior Function Level of Independence: Independent         Comments: Works from home; does payroll. Daughter will be able to stay with pt at d/c.     Hand Dominance        Extremity/Trunk Assessment   Upper Extremity Assessment: Defer to OT evaluation           Lower Extremity Assessment: Generalized weakness         Communication   Communication: Expressive difficulties (Difficulty with word finding and getting words out that make sense.)  Cognition Arousal/Alertness: Awake/alert Behavior During Therapy:  WFL for tasks assessed/performed Overall Cognitive Status: Within Functional Limits for tasks assessed                      General Comments General comments (skin integrity,  edema, etc.): Spouse and daughter present during session. Pt able to recall anniversary date, husband's bday, name objects in hallway etc.     Exercises     Assessment/Plan    PT Assessment Patient needs continued PT services  PT Problem List Decreased strength;Decreased mobility;Decreased balance;Decreased cognition       PT Diagnosis   Muscle weakness (generalized)  CVA (cerebral infarction)  Aphasia - Plan: MR Brain Wo Contrast, MR Brain Wo Contrast  Stroke (HCC) - Plan: IR ANGIO INTRA EXTRACRAN SEL COM CAROTID INNOMINATE UNI L MOD SED, IR ANGIO INTRA EXTRACRAN SEL COM CAROTID INNOMINATE UNI L MOD SED, IR ANGIO VERTEBRAL SEL VERTEBRAL UNI L MOD SED, IR ANGIO VERTEBRAL SEL VERTEBRAL UNI L MOD SED, IR ANGIO EXTRACRAN SEL COM CAROTID INNOMINATE UNI R MOD SED, IR ANGIO EXTRACRAN SEL COM CAROTID INNOMINATE UNI R MOD SED, CANCELED: IR ANGIO INTRA EXTRACRAN SEL COM CAROTID INNOMINATE BILAT MOD SED, CANCELED: IR ANGIO INTRA EXTRACRAN SEL COM CAROTID INNOMINATE BILAT MOD SED  Cerebrovascular accident (CVA) due to thrombosis of left middle cerebral artery (HCC) - Plan: IR ANGIO INTRA EXTRACRAN SEL COM CAROTID INNOMINATE UNI L MOD SED, IR ANGIO VERTEBRAL SEL VERTEBRAL UNI L MOD SED, IR ANGIO VERTEBRAL SEL VERTEBRAL UNI L MOD SED, IR ANGIO EXTRACRAN SEL COM CAROTID INNOMINATE UNI R MOD SED, IR ANGIO EXTRACRAN SEL COM CAROTID INNOMINATE UNI R MOD SED, CANCELED: IR ANGIO INTRA EXTRACRAN SEL COM CAROTID INNOMINATE BILAT MOD SED  Stroke (cerebrum) (HCC) - Plan: DG Chest 2 View, DG Chest 2 View    PT Treatment Interventions Therapeutic activities;Gait training;Therapeutic exercise;Balance training;Cognitive remediation;Patient/family education    PT Goals (Current goals can be found in the Care Plan section)  Acute Rehab PT Goals Patient Stated Goal: to get back to independence PT Goal Formulation: With patient Time For Goal Achievement: 01/17/16 Potential to Achieve Goals: Good    Frequency  Min 4X/week   Barriers to discharge        Co-evaluation               End of Session Equipment Utilized During Treatment: Gait belt Activity Tolerance: Patient tolerated treatment well Patient left: in chair;with call bell/phone within reach;with family/visitor present Nurse Communication: Mobility status         Time: [id number] PT Time Calculation (min) (ACUTE ONLY): 24 min   Charges:   PT Evaluation $PT Eval Moderate Complexity: 1 Procedure PT Treatments $Gait Training: 8-22 mins   PT G Codes:        Tonjia Parillo A Ronesha Heenan 01/03/2016, 11:54 AM Mylo RedShauna Kamar Callender, PT, DPT [phone]

## 2016-01-03 NOTE — Progress Notes (Signed)
STROKE TEAM PROGRESS NOTE   HISTORY OF PRESENT ILLNESS (per record) Caroline [name] is a 60 y.o. female with a history of diabetes and hypertension who presents with aphasia that started this morning. Her husband saw her around 7 AM, then when a coworker came by her house a little while later she was found to be aphasic. A code stroke was called and I met her at the bridge. She was brought into the emergency room after in the IV TPA window had elapsed, and due to her mild symptoms was not initially considered for intra-arterial therapy. CT angiogram however did show what was initially thought to be a fairly large occlusion and given her mild symptoms and was thought she might have some tissue still at risk. She had some waxing and waning. An MRI was performed which showed a relatively small infarct. She was taken for angiogram which demonstrated a lesion that was not amenable to mechanical thrombectomy and therefore no further action was taken.   LKW: 7 AM tpa given?: no, outside of window   SUBJECTIVE (INTERVAL HISTORY) Her husband and daughter are at the bedside.  Overall she feels her condition is rapidly improving. She still has mild hesitancy of speech, but largely recovered. She denies any heart fluttering, racing heart.   OBJECTIVE Temp:  [97 F (36.1 C)-98.6 F (37 C)] 97 F (36.1 C) (09/15 0630) Pulse Rate:  [55-82] 82 (09/15 0630) Cardiac Rhythm: Heart block (09/15 0700) Resp:  [12-24] 20 (09/15 0630) BP: (102-135)/(67-86) 129/79 (09/15 0630) SpO2:  [93 %-100 %] 98 % (09/15 0630) Weight:  [88 kg (194 lb 0.1 oz)] 88 kg (194 lb 0.1 oz) (09/14 2140)  CBC:  Recent Labs Lab 01/02/16 1152 01/02/16 1156  WBC 7.2  --   NEUTROABS 4.6  --   HGB 15.1* 16.3*  HCT 47.9* 48.0*  MCV 97.4  --   PLT 169  --     Basic Metabolic Panel:  Recent Labs Lab 01/02/16 1152 01/02/16 1156  NA 138 138  K 3.8 4.0  CL 105 103  CO2 22  --   GLUCOSE 158* 154*  BUN 8 10  CREATININE 0.89  0.90  CALCIUM 9.2  --     Lipid Panel: No results found for: CHOL, TRIG, HDL, CHOLHDL, VLDL, LDLCALC HgbA1c: No results found for: HGBA1C Urine Drug Screen:    Component Value Date/Time   LABOPIA NONE DETECTED 01/02/2016 1949   COCAINSCRNUR NONE DETECTED 01/02/2016 1949   LABBENZ NONE DETECTED 01/02/2016 1949   AMPHETMU NONE DETECTED 01/02/2016 1949   THCU NONE DETECTED 01/02/2016 1949   LABBARB NONE DETECTED 01/02/2016 1949      IMAGING I have personally reviewed the radiological images below and agree with the radiology interpretations.  Ct Angio Head and neck W Or Wo Contrast 01/02/2016  CT HEAD  Possible early infarct left subinsular/ operculum region.   CTA HEAD  No significant stenosis left carotid terminus or M1 segment of the left middle cerebral artery. 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 Fetal origin of posterior cerebral arteries. Right vertebral artery ends in a posterior inferior cerebellar artery distribution.   CTA NECK  4 vessel arch with left vertebral artery arising directly from the arch. No significant stenosis of either carotid bifurcation.  Dg Chest 2 View 01/02/2016 No active cardiopulmonary disease.   Mr Brain Wo Contrast 01/02/2016 Present exam was performed in an abbreviated fashion per request. Small to slightly moderate-size  acute left parietal lobe infarct. There is a region of blood breakdown products within sulcus adjacent to the acute infarct which may represent clot within left middle cerebral artery branch vessel with slow flow/no flow in left middle cerebral artery vessels beyond this region. Mild chronic microvascular changes.   Ct Head Code Stroke W/o Cm 01/02/2016 1. Acute LEFT hemisphere affecting the insula and LEFT temporal lobe.  2. ASPECTS is 8.  3. Hyperdense LEFT MCA M2/M3 vessel in the sylvian fissure. Subtle asymmetric hyperdensity of the LEFT ICA terminus and LEFT M1  MCA could signify a more proximal thrombosis. CTA head/neck is pending.   Cerebral angio 1.distal  LT MCA inferior division angular branch occlusion ,associated with a mod sized area of  absent perfusion  of the left parietal cortical subcortical junction.  2-D Echo pending  LE venous Doppler pending   PHYSICAL EXAM Constitutional: Appears well-developed and well-nourished.  Psych: Affect appropriate to situation Eyes: No scleral injection HENT: No OP obstrucion Head: Normocephalic.  Cardiovascular: Normal rate and regular rhythm.  Respiratory: Effort normal and breath sounds normal to anterior ascultation GI: Soft.  No distension. There is no tenderness.  Skin: WDI  Neuro: Mental Status: Patient is awake, alert, oriented to person, place, month, year, and situation. Patient is able to give a clear and coherent history. No signs of neglect. She has a mild expressive aphasia, with intermittent hesitancy and word finding difficulties Cranial Nerves: II: Visual Fields are full. Pupils are equal, round, and reactive to light.   III,IV, VI: EOMI without ptosis or diploplia.  V: Facial sensation is symmetric to temperature VII: Facial movement is notable for very rough mild right facial droop VIII: hearing is intact to voice X: Uvula elevates symmetrically XI: Shoulder shrug is symmetric. XII: tongue is midline without atrophy or fasciculations.  Motor: Tone is normal. Bulk is normal. 5/5 strength was present in all four extremities.  Sensory: Sensation is symmetric to light touch and temperature in the arms and legs. Cerebellar: FNF and HKS are intact bilaterally    ASSESSMENT/PLAN [name] is a 60 y.o. female with history of diabetes and hypertension presenting with aphasia. She did not receive IV t-PA due to late presentation.  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  Resultant  mild expressive aphasia  MRI - Small to slightly  moderate-size acute left parietal lobe infarct  CTA Head and neck - Occluded left M3.   2D Echo - pending  LE Venous Dopplers - pending  Consider TEE and Loop recorder to rule out A. fib if above findings negative.  LDL - 68  HgbA1c pending  VTE prophylaxis - Lovenox  Diet Carb Modified Fluid consistency: Thin; Room service appropriate? Yes  aspirin 81 mg daily prior to admission, now on aspirin 325 mg daily. Switch to Plavix for stroke prevention.  Patient counseled to be compliant with her antithrombotic medications  Ongoing aggressive stroke risk factor management  Therapy recommendations: Outpatient PT recommended. OT eval pending.  Disposition:  pending  Hypertension  Stable  Permissive hypertension (OK if < 220/120) but gradually normalize in 5-7 days  Long-term BP goal normotensive  Hyperlipidemia  Home meds:  No lipid lowering medications prior to admission  LDL 68, goal < 70  Add Lipitor 10 for stroke prevention  Diabetes  HgbA1c pending, goal < 7.0  Controlled  Other Stroke Risk Factors  Advanced age  Obesity, Body mass index is 30.39 kg/m., recommend weight loss, diet  and exercise as appropriate   Other Active Problems  Gastritis, diarrhea 2 days before the current stroke  Hospital day # 1  Rosalin Hawking, MD PhD Stroke Neurology 01/03/2016 6:16 PM    To contact Stroke Continuity provider, please refer to [email or web address] After hours, contact General Neurology

## 2016-01-04 ENCOUNTER — Other Ambulatory Visit (HOSPITAL_COMMUNITY): Payer: 59

## 2016-01-04 ENCOUNTER — Inpatient Hospital Stay (HOSPITAL_COMMUNITY): Payer: BLUE CROSS/BLUE SHIELD

## 2016-01-04 DIAGNOSIS — I63312 Cerebral infarction due to thrombosis of left middle cerebral artery: Secondary | ICD-10-CM

## 2016-01-04 DIAGNOSIS — I6789 Other cerebrovascular disease: Secondary | ICD-10-CM

## 2016-01-04 DIAGNOSIS — I639 Cerebral infarction, unspecified: Secondary | ICD-10-CM

## 2016-01-04 LAB — GLUCOSE, CAPILLARY
Glucose-Capillary: 141 mg/dL — ABNORMAL HIGH (ref 65–99)
Glucose-Capillary: 121 mg/dL — ABNORMAL HIGH (ref 65–99)
Glucose-Capillary: 111 mg/dL — ABNORMAL HIGH (ref 65–99)
Glucose-Capillary: 111 mg/dL — ABNORMAL HIGH (ref 65–99)

## 2016-01-04 LAB — ECHOCARDIOGRAM COMPLETE
HEIGHTINCHES: 67 in
WEIGHTICAEL: 3104.08 [oz_av]

## 2016-01-04 LAB — HEMOGLOBIN A1C
HEMOGLOBIN A1C: 6.7 % — AB (ref 4.8–5.6)
Mean Plasma Glucose: 146 mg/dL

## 2016-01-04 NOTE — Progress Notes (Signed)
RN paged oncall vascular. Oncoming RN direct number given. Patient and patient family informed.

## 2016-01-04 NOTE — Progress Notes (Signed)
OT Cancellation Note  Patient Details Name: Caroline [name] MRN: [id number] DOB: 1956/01/07   Cancelled Treatment:    Reason Eval/Treat Not Completed: Patient at procedure or test/ unavailable (Vascular)  Nils PyleJulia Kairi Tufo, OTR/L Pager: [phone] 01/04/2016, 1:26 PM

## 2016-01-04 NOTE — Progress Notes (Signed)
Vascular tech on call paged again

## 2016-01-04 NOTE — Progress Notes (Signed)
Patient reports pain/sensation change in BLE and BUE, alternating from right to left intermitantly. Complete neuro assessment complete. NIH unchanged. Symptoms resolved 20-30 min. RN will continue to monitor.

## 2016-01-04 NOTE — Progress Notes (Signed)
  Echocardiogram 2D Echocardiogram has been performed.  Cathie BeamsGREGORY, Makahla Kiser 01/04/2016, 12:22 PM

## 2016-01-04 NOTE — Progress Notes (Signed)
Bedside report complete. Patients husband request to speak with CRN and/or administer in order to inquire about patients current treatment plan and progression. CRN notified.

## 2016-01-04 NOTE — Progress Notes (Signed)
Physical Therapy Treatment Patient Details Name: Caroline [name] MRN: [id number] DOB: 24-Aug-1955 Today's Date: 01/04/2016    History of Present Illness Patient is a 60 y/o female with hx of DM and HTN who presents with aphasia. Head CT-Acute LEFT hemisphere affecting the insula and LEFT temporal lobe. MRI- + Small to slightly moderate-size acute left parietal lobe infarct. 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 Balance and gait speed much improved today. Scored 21/24 on DGI. Continues to demonstrate some mild balance deficits with higher level balance challenges - esp with head turns and walking backwards. Encouraged increasing activity/mobility with family in hospital. Will follow.  Follow Up Recommendations  Outpatient PT;Supervision - Intermittent     Equipment Recommendations  None recommended by PT    Recommendations for Other Services       Precautions / Restrictions Precautions Precautions: Fall Restrictions Weight Bearing Restrictions: No    Mobility  Bed Mobility Overal bed mobility: Needs Assistance Bed Mobility: Supine to Sit     Supine to sit: Modified independent (Device/Increase time);HOB elevated        Transfers Overall transfer level: Needs assistance Equipment used: None Transfers: Sit to/from Stand Sit to Stand: Supervision         General transfer comment: Supervision for safety as pt with mild balance deficits noted with multi tasking.  Ambulation/Gait Ambulation/Gait assistance: Min guard Ambulation Distance (Feet): 400 Feet Assistive device: None Gait Pattern/deviations: Step-through pattern;Decreased stride length;Staggering right;Staggering left;Drifts right/left Gait velocity: decreased Gait velocity interpretation: Below normal speed for age/gender General Gait Details: More steady gait today but continues to demonstrate imbalance during higher level  challenges. See balance section for details.   Stairs Stairs: Yes Stairs assistance: Supervision Stair Management: Alternating pattern;One rail Right Number of Stairs: 3 (+ 2 steps x2 bouts) General stair comments: Cues to safety.   Wheelchair Mobility    Modified Rankin (Stroke Patients Only) Modified Rankin (Stroke Patients Only) Pre-Morbid Rankin Score: No symptoms Modified Rankin: Moderate disability     Balance Overall balance assessment: Needs assistance Sitting-balance support: Feet supported;No upper extremity supported Sitting balance-Leahy Scale: Good     Standing balance support: During functional activity Standing balance-Leahy Scale: Fair Standing balance comment: Able to stand and perform ADLs at sink with Min guard due to mild unsteadiness.                    Cognition Arousal/Alertness: Awake/alert Behavior During Therapy: WFL for tasks assessed/performed Overall Cognitive Status: Within Functional Limits for tasks assessed                      Exercises      General Comments        Pertinent Vitals/Pain Pain Assessment: Faces Faces Pain Scale: Hurts little more Pain Location: headache Pain Descriptors / Indicators: Headache Pain Intervention(s): Monitored during session;Premedicated before session;Repositioned    Home Living                      Prior Function            PT Goals (current goals can now be found in the care plan section) Progress towards PT goals: Progressing toward goals    Frequency    Min 4X/week      PT Plan Current plan remains appropriate    Co-evaluation  End of Session Equipment Utilized During Treatment: Gait belt Activity Tolerance: Patient tolerated treatment well Patient left: in chair;with call bell/phone within reach;with family/visitor present     Time: 2440-10270848-0907 PT Time Calculation (min) (ACUTE ONLY): 19 min  Charges:  $Neuromuscular Re-education:  8-22 mins                    G Codes:      Renalda Locklin A Tanner Yeley 01/04/2016, 10:19 AM Mylo RedShauna Djuna Frechette, PT, DPT [phone]

## 2016-01-04 NOTE — Progress Notes (Signed)
Pt since this morning has been signed off independent, as long as pt wears yellow socks and family is standy by. Family is very attentive to pt. rn walked with pt and family estimated 80 feet with gait belt. Pt ambulated independently.   rn will allow pt to walk with gait belt and family down halls of unit.

## 2016-01-04 NOTE — Progress Notes (Signed)
Order has been received for a cognitive-linguistic evaluation. Message was relayed to SLP indicating that family has been questioning why the pt has not yet been evaluated, which is due to weekend staffing and a high volume of orders for NPO pts. We will evaluate the pt as soon as time and scheduling allows.   Thana AtesAmy Oleksiak, MA, CCC-SLP [phone]

## 2016-01-04 NOTE — Progress Notes (Signed)
STROKE TEAM PROGRESS NOTE   HISTORY OF PRESENT ILLNESS (per record) Caroline [name] is a 60 y.o. female with a history of diabetes and hypertension who presents with aphasia that started this morning. Her husband saw her around 7 AM, then when a coworker came by her house a little while later she was found to be aphasic. A code stroke was called and I met her at the bridge. She was brought into the emergency room after in the IV TPA window had elapsed, and due to her mild symptoms was not initially considered for intra-arterial therapy. CT angiogram however did show what was initially thought to be a fairly large occlusion and given her mild symptoms and was thought she might have some tissue still at risk. She had some waxing and waning. An MRI was performed which showed a relatively small infarct. She was taken for angiogram which demonstrated a lesion that was not amenable to mechanical thrombectomy and therefore no further action was taken.   LKW: 7 AM tpa given?: no, outside of window   SUBJECTIVE (INTERVAL HISTORY) Her husband and daughter are at the bedside.  Overall she feels her condition is rapidly improving. She still has mild hesitancy of speech, but largely recovered. She denies any heart fluttering, racing heart.I answered her questions about TEE and loop recorder   OBJECTIVE Temp:  [97.9 F (36.6 C)-98.8 F (37.1 C)] 98 F (36.7 C) (09/16 0921) Pulse Rate:  [54-88] 54 (09/16 0557) Cardiac Rhythm: Normal sinus rhythm (09/16 0700) Resp:  [18-20] 20 (09/16 0921) BP: (100-119)/(52-69) 111/65 (09/16 0921) SpO2:  [97 %-98 %] 98 % (09/16 0921)  CBC:   Recent Labs Lab 01/02/16 1152 01/02/16 1156  WBC 7.2  --   NEUTROABS 4.6  --   HGB 15.1* 16.3*  HCT 47.9* 48.0*  MCV 97.4  --   PLT 169  --     Basic Metabolic Panel:   Recent Labs Lab 01/02/16 1152 01/02/16 1156  NA 138 138  K 3.8 4.0  CL 105 103  CO2 22  --   GLUCOSE 158* 154*  BUN 8 10  CREATININE 0.89  0.90  CALCIUM 9.2  --     Lipid Panel:     Component Value Date/Time   CHOL 136 01/03/2016 0753   TRIG 163 (H) 01/03/2016 0753   HDL 35 (L) 01/03/2016 0753   CHOLHDL 3.9 01/03/2016 0753   VLDL 33 01/03/2016 0753   LDLCALC 68 01/03/2016 0753   HgbA1c:  Lab Results  Component Value Date   HGBA1C 6.7 (H) 01/03/2016   Urine Drug Screen:     Component Value Date/Time   LABOPIA NONE DETECTED 01/02/2016 1949   COCAINSCRNUR NONE DETECTED 01/02/2016 1949   LABBENZ NONE DETECTED 01/02/2016 1949   AMPHETMU NONE DETECTED 01/02/2016 1949   THCU NONE DETECTED 01/02/2016 1949   LABBARB NONE DETECTED 01/02/2016 1949      IMAGING I have personally reviewed the radiological images below and agree with the radiology interpretations.  Ct Angio Head and neck W Or Wo Contrast 01/02/2016  CT HEAD  Possible early infarct left subinsular/ operculum region.   CTA HEAD  No significant stenosis left carotid terminus or M1 segment of the left middle cerebral artery. 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 Fetal origin of posterior cerebral arteries. Right vertebral artery ends in a posterior inferior cerebellar artery distribution.   CTA NECK  4 vessel arch with left vertebral artery  arising directly from the arch. No significant stenosis of either carotid bifurcation.  Dg Chest 2 View 01/02/2016 No active cardiopulmonary disease.   Mr Brain Wo Contrast 01/02/2016 Present exam was performed in an abbreviated fashion per request. Small to slightly moderate-size acute left parietal lobe infarct. There is a region of blood breakdown products within sulcus adjacent to the acute infarct which may represent clot within left middle cerebral artery branch vessel with slow flow/no flow in left middle cerebral artery vessels beyond this region. Mild chronic microvascular changes.   Ct Head Code Stroke W/o Cm 01/02/2016 1. Acute LEFT hemisphere  affecting the insula and LEFT temporal lobe.  2. ASPECTS is 8.  3. Hyperdense LEFT MCA M2/M3 vessel in the sylvian fissure. Subtle asymmetric hyperdensity of the LEFT ICA terminus and LEFT M1 MCA could signify a more proximal thrombosis. CTA head/neck is pending.   Cerebral angio 1.distal  LT MCA inferior division angular branch occlusion ,associated with a mod sized area of  absent perfusion  of the left parietal cortical subcortical junction.  2-D Echo pending  LE venous Doppler negative for DVT  PHYSICAL EXAM Constitutional: Appears well-developed and well-nourished.  Psych: Affect appropriate to situation Eyes: No scleral injection HENT: No OP obstrucion Head: Normocephalic.  Cardiovascular: Normal rate and regular rhythm.  Respiratory: Effort normal and breath sounds normal to anterior ascultation GI: Soft.  No distension. There is no tenderness.  Skin: WDI  Neuro: Mental Status: Patient is awake, alert, oriented to person, place, month, year, and situation. Patient is able to give a clear and coherent history. No signs of neglect. She has a mild expressive aphasia, with intermittent hesitancy and word finding difficulties Cranial Nerves: II: Visual Fields are full. Pupils are equal, round, and reactive to light.   III,IV, VI: EOMI without ptosis or diploplia.  V: Facial sensation is symmetric to temperature VII: Facial movement is notable for very rough mild right facial droop VIII: hearing is intact to voice X: Uvula elevates symmetrically XI: Shoulder shrug is symmetric. XII: tongue is midline without atrophy or fasciculations.  Motor: Tone is normal. Bulk is normal. 5/5 strength was present in all four extremities.  Sensory: Sensation is symmetric to light touch and temperature in the arms and legs. Cerebellar: FNF and HKS are intact bilaterally    ASSESSMENT/PLAN [name] is a 60 y.o. female with history of diabetes and hypertension presenting with  aphasia. She did not receive IV t-PA due to late presentation.  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  Resultant  mild expressive aphasia  MRI - Small to slightly moderate-size acute left parietal lobe infarct  CTA Head and neck - Occluded left M3.   2D Echo - pending  LE Venous Dopplers - pending  Consider TEE and Loop recorder to rule out A. fib if above findings negative.  LDL - 68  HgbA1c pending  VTE prophylaxis - Lovenox Diet Carb Modified Fluid consistency: Thin; Room service appropriate? Yes Diet NPO time specified Except for: Sips with Meds  aspirin 81 mg daily prior to admission, now on aspirin 325 mg daily. Switch to Plavix for stroke prevention.  Patient counseled to be compliant with her antithrombotic medications  Ongoing aggressive stroke risk factor management  Therapy recommendations: Outpatient PT recommended. OT eval pending.  Disposition:  pending  Hypertension  Stable  Permissive hypertension (OK if < 220/120) but gradually normalize in 5-7 days  Long-term BP goal normotensive  Hyperlipidemia  Home  meds:  No lipid lowering medications prior to admission  LDL 68, goal < 70  Add Lipitor 10 for stroke prevention  Diabetes  HgbA1c pending, goal < 7.0  Controlled  Other Stroke Risk Factors  Advanced age  Obesity, Body mass index is 30.39 kg/m., recommend weight loss, diet and exercise as appropriate   Other Active Problems  Gastritis, diarrhea 2 days before the current stroke  Hospital day # 2 I have personally examined this patient, reviewed notes, independently viewed imaging studies, participated in medical decision making and plan of care.ROS completed by me personally and pertinent positives fully documented  I have made any additions or clarifications directly to the above note. I had a long discussion with the patient and family with regards to her ongoing stroke evaluation and discuss need for TEE and  loop recorder and answered questions. Greater than 50% time during this 25 minute visit was spent on counseling and coordination of care about stroke risk and treatment  Antony Contras, MD Medical Director [place] Pager: [phone] 01/04/2016 1:45 PM  Antony Contras, MD Stroke Neurology 01/04/2016 1:43 PM    To contact Stroke Continuity provider, please refer to [email or web address] After hours, contact General Neurology

## 2016-01-04 NOTE — Progress Notes (Signed)
Preliminary results by tech - Venous Duplex Lower Ext. Completed. Negative for deep and superficial vein thrombosis in both legs.  Randi [employer], BS, RDMS, RVT  
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
## 2016-01-04 NOTE — Progress Notes (Signed)
Pt back in room.

## 2016-01-04 NOTE — Progress Notes (Signed)
Called vascular about status of lowe extremity US. They will call rn back.

## 2016-01-05 DIAGNOSIS — I63019 Cerebral infarction due to thrombosis of unspecified vertebral artery: Secondary | ICD-10-CM

## 2016-01-05 LAB — GLUCOSE, CAPILLARY
GLUCOSE-CAPILLARY: 142 mg/dL — AB (ref 65–99)
Glucose-Capillary: 136 mg/dL — ABNORMAL HIGH (ref 65–99)
Glucose-Capillary: 132 mg/dL — ABNORMAL HIGH (ref 65–99)
Glucose-Capillary: 125 mg/dL — ABNORMAL HIGH (ref 65–99)

## 2016-01-05 MED ORDER — LORATADINE 10 MG PO TABS
10.0000 mg | ORAL_TABLET | Freq: Every day | ORAL | Status: DC
  Administered 2016-01-05 – 2016-01-08 (×4): 10 mg via ORAL
  Filled 2016-01-05 (×4): qty 1

## 2016-01-05 NOTE — Progress Notes (Signed)
STROKE TEAM PROGRESS NOTE   HISTORY OF PRESENT ILLNESS (per record) Caroline [name] is a 60 y.o. female with a history of diabetes and hypertension who presents with aphasia that started this morning. Her husband saw her around 7 AM, then when a coworker came by her house a little while later she was found to be aphasic. A code stroke was called and I met her at the bridge. She was brought into the emergency room after in the IV TPA window had elapsed, and due to her mild symptoms was not initially considered for intra-arterial therapy. CT angiogram however did show what was initially thought to be a fairly large occlusion and given her mild symptoms and was thought she might have some tissue still at risk. She had some waxing and waning. An MRI was performed which showed a relatively small infarct. She was taken for angiogram which demonstrated a lesion that was not amenable to mechanical thrombectomy and therefore no further action was taken.   LKW: 7 AM tpa given?: no, outside of window   SUBJECTIVE (INTERVAL HISTORY) Her husband , son and daughter are at the bedside.  Overall she feels her condition is rapidly improving. She still has mild hesitancy of speech, but largely recovered.  .I answered her questions about TEE and loop recorder   OBJECTIVE Temp:  [98 F (36.7 C)-98.5 F (36.9 C)] 98 F (36.7 C) (09/17 0935) Pulse Rate:  [51-64] 59 (09/17 0935) Cardiac Rhythm: Normal sinus rhythm (09/17 0700) Resp:  [16-20] 16 (09/17 0935) BP: (105-131)/(67-79) 105/79 (09/17 0935) SpO2:  [97 %-100 %] 100 % (09/17 0935)  CBC:   Recent Labs Lab 01/02/16 1152 01/02/16 1156  WBC 7.2  --   NEUTROABS 4.6  --   HGB 15.1* 16.3*  HCT 47.9* 48.0*  MCV 97.4  --   PLT 169  --     Basic Metabolic Panel:   Recent Labs Lab 01/02/16 1152 01/02/16 1156  NA 138 138  K 3.8 4.0  CL 105 103  CO2 22  --   GLUCOSE 158* 154*  BUN 8 10  CREATININE 0.89 0.90  CALCIUM 9.2  --     Lipid  Panel:     Component Value Date/Time   CHOL 136 01/03/2016 0753   TRIG 163 (H) 01/03/2016 0753   HDL 35 (L) 01/03/2016 0753   CHOLHDL 3.9 01/03/2016 0753   VLDL 33 01/03/2016 0753   LDLCALC 68 01/03/2016 0753   HgbA1c:  Lab Results  Component Value Date   HGBA1C 6.7 (H) 01/03/2016   Urine Drug Screen:     Component Value Date/Time   LABOPIA NONE DETECTED 01/02/2016 1949   COCAINSCRNUR NONE DETECTED 01/02/2016 1949   LABBENZ NONE DETECTED 01/02/2016 1949   AMPHETMU NONE DETECTED 01/02/2016 1949   THCU NONE DETECTED 01/02/2016 1949   LABBARB NONE DETECTED 01/02/2016 1949      IMAGING I have personally reviewed the radiological images below and agree with the radiology interpretations.  Ct Angio Head and neck W Or Wo Contrast 01/02/2016  CT HEAD  Possible early infarct left subinsular/ operculum region.   CTA HEAD  No significant stenosis left carotid terminus or M1 segment of the left middle cerebral artery. 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 Fetal origin of posterior cerebral arteries. Right vertebral artery ends in a posterior inferior cerebellar artery distribution.   CTA NECK  4 vessel arch with left vertebral artery arising directly from  the arch. No significant stenosis of either carotid bifurcation.  Dg Chest 2 View 01/02/2016 No active cardiopulmonary disease.   Mr Brain Wo Contrast 01/02/2016 Present exam was performed in an abbreviated fashion per request. Small to slightly moderate-size acute left parietal lobe infarct. There is a region of blood breakdown products within sulcus adjacent to the acute infarct which may represent clot within left middle cerebral artery branch vessel with slow flow/no flow in left middle cerebral artery vessels beyond this region. Mild chronic microvascular changes.   Ct Head Code Stroke W/o Cm 01/02/2016 1. Acute LEFT hemisphere affecting the insula and LEFT  temporal lobe.  2. ASPECTS is 8.  3. Hyperdense LEFT MCA M2/M3 vessel in the sylvian fissure. Subtle asymmetric hyperdensity of the LEFT ICA terminus and LEFT M1 MCA could signify a more proximal thrombosis. CTA head/neck is pending.   Cerebral angio 1.distal  LT MCA inferior division angular branch occlusion ,associated with a mod sized area of  absent perfusion  of the left parietal cortical subcortical junction.  2-D Echo pending  LE venous Doppler negative for DVT  PHYSICAL EXAM Constitutional: Appears well-developed and well-nourished.  Psych: Affect appropriate to situation Eyes: No scleral injection HENT: No OP obstrucion Head: Normocephalic.  Cardiovascular: Normal rate and regular rhythm.  Respiratory: Effort normal and breath sounds normal to anterior ascultation GI: Soft.  No distension. There is no tenderness.  Skin: WDI  Neuro: Mental Status: Patient is awake, alert, oriented to person, place, month, year, and situation. Patient is able to give a clear and coherent history. No signs of neglect. She has a mild expressive aphasia, with intermittent hesitancy and word finding difficulties Cranial Nerves: II: Visual Fields are full. Pupils are equal, round, and reactive to light.   III,IV, VI: EOMI without ptosis or diploplia.  V: Facial sensation is symmetric to temperature VII: Facial movement is notable for very rough mild right facial droop VIII: hearing is intact to voice X: Uvula elevates symmetrically XI: Shoulder shrug is symmetric. XII: tongue is midline without atrophy or fasciculations.  Motor: Tone is normal. Bulk is normal. 5/5 strength was present in all four extremities.  Sensory: Sensation is symmetric to light touch and temperature in the arms and legs. Cerebellar: FNF and HKS are intact bilaterally    ASSESSMENT/PLAN Ms. NAISHA WISDOM is a 60 y.o. female with history of diabetes and hypertension presenting with aphasia. She did not receive  IV t-PA due to late presentation.  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  Resultant  mild expressive aphasia  MRI - Small to slightly moderate-size acute left parietal lobe infarct  CTA Head and neck - Occluded left M3.   2D Echo - pending  LE Venous Dopplers - negative for DVT  Consider TEE and Loop recorder to rule out A. fib if above findings negative.  LDL - 68  HgbA1c 6.7  VTE prophylaxis - Lovenox Diet Carb Modified Fluid consistency: Thin; Room service appropriate? Yes Diet NPO time specified Except for: Sips with Meds  aspirin 81 mg daily prior to admission, now on aspirin 325 mg daily. Switch to Plavix for stroke prevention.  Patient counseled to be compliant with her antithrombotic medications  Ongoing aggressive stroke risk factor management  Therapy recommendations: Outpatient PT recommended. OT eval pending.  Disposition:  pending  Hypertension  Stable  Permissive hypertension (OK if < 220/120) but gradually normalize in 5-7 days  Long-term BP goal normotensive  Hyperlipidemia  Home meds:  No lipid lowering medications prior to admission  LDL 68, goal < 70  Add Lipitor 10 for stroke prevention  Diabetes  HgbA1c pending, goal < 7.0  Controlled  Other Stroke Risk Factors  Advanced age  Obesity, Body mass index is 30.39 kg/m., recommend weight loss, diet and exercise as appropriate   Other Active Problems  Gastritis, diarrhea 2 days before the current stroke  Hospital day # 3 I have personally examined this patient, reviewed notes, independently viewed imaging studies, participated in medical decision making and plan of care.ROS completed by me personally and pertinent positives fully documented  I have made any additions or clarifications directly to the above note. I had a long discussion with the patient and family with regards to her ongoing stroke evaluation and discuss need for TEE and loop recorder and  answered questions. Greater than 50% time during this 25 minute visit was spent on counseling and coordination of care about stroke risk and treatment  Antony Contras, MD Medical Director [place] Pager: [phone] 01/05/2016 1:39 PM  Antony Contras, MD Stroke Neurology 01/05/2016 1:39 PM    To contact Stroke Continuity provider, please refer to [email or web address] After hours, contact General Neurology

## 2016-01-06 ENCOUNTER — Encounter (HOSPITAL_COMMUNITY): Payer: Self-pay | Admitting: Interventional Radiology

## 2016-01-06 LAB — GLUCOSE, CAPILLARY
GLUCOSE-CAPILLARY: 144 mg/dL — AB (ref 65–99)
Glucose-Capillary: 122 mg/dL — ABNORMAL HIGH (ref 65–99)
Glucose-Capillary: 105 mg/dL — ABNORMAL HIGH (ref 65–99)
Glucose-Capillary: 132 mg/dL — ABNORMAL HIGH (ref 65–99)

## 2016-01-06 MED ORDER — ALUM & MAG HYDROXIDE-SIMETH 200-200-20 MG/5ML PO SUSP
30.0000 mL | ORAL | Status: DC | PRN
  Administered 2016-01-06: 30 mL via ORAL
  Filled 2016-01-06: qty 30

## 2016-01-06 NOTE — Progress Notes (Signed)
STROKE TEAM PROGRESS NOTE   SUBJECTIVE (INTERVAL HISTORY) Her family members, 3 of them, are at the bedside. They are upset she did not get her TEE today  nor has a ST seen for language.   OBJECTIVE Temp:  [97.9 F (36.6 C)-98.3 F (36.8 C)] 98.3 F (36.8 C) (09/18 1006) Pulse Rate:  [57-73] 60 (09/18 1006) Cardiac Rhythm: Sinus bradycardia (09/18 0700) Resp:  [16-18] 18 (09/18 1006) BP: (109-127)/(62-79) 127/77 (09/18 1006) SpO2:  [95 %-100 %] 100 % (09/18 1006)  CBC:   Recent Labs Lab 01/02/16 1152 01/02/16 1156  WBC 7.2  --   NEUTROABS 4.6  --   HGB 15.1* 16.3*  HCT 47.9* 48.0*  MCV 97.4  --   PLT 169  --     Basic Metabolic Panel:   Recent Labs Lab 01/02/16 1152 01/02/16 1156  NA 138 138  K 3.8 4.0  CL 105 103  CO2 22  --   GLUCOSE 158* 154*  BUN 8 10  CREATININE 0.89 0.90  CALCIUM 9.2  --     Lipid Panel:     Component Value Date/Time   CHOL 136 01/03/2016 0753   TRIG 163 (H) 01/03/2016 0753   HDL 35 (L) 01/03/2016 0753   CHOLHDL 3.9 01/03/2016 0753   VLDL 33 01/03/2016 0753   LDLCALC 68 01/03/2016 0753   HgbA1c:  Lab Results  Component Value Date   HGBA1C 6.7 (H) 01/03/2016   Urine Drug Screen:     Component Value Date/Time   LABOPIA NONE DETECTED 01/02/2016 1949   COCAINSCRNUR NONE DETECTED 01/02/2016 1949   LABBENZ NONE DETECTED 01/02/2016 1949   AMPHETMU NONE DETECTED 01/02/2016 1949   THCU NONE DETECTED 01/02/2016 1949   LABBARB NONE DETECTED 01/02/2016 1949      IMAGING  CT HEAD  01/02/2016 Possible early infarct left subinsular/ operculum region.   CTA HEAD  01/02/2016 No significant stenosis left carotid terminus or M1 segment of the left middle cerebral artery. 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 Fetal origin of posterior cerebral arteries. Right vertebral artery ends in a posterior inferior cerebellar artery distribution.   CTA NECK  01/02/2016 4 vessel  arch with left vertebral artery arising directly from the arch. No significant stenosis of either carotid bifurcation.  Dg Chest 2 View 01/02/2016 No active cardiopulmonary disease.   Mr Brain Wo Contrast 01/02/2016 Present exam was performed in an abbreviated fashion per request. Small to slightly moderate-size acute left parietal lobe infarct. There is a region of blood breakdown products within sulcus adjacent to the acute infarct which may represent clot within left middle cerebral artery branch vessel with slow flow/no flow in left middle cerebral artery vessels beyond this region. Mild chronic microvascular changes.   Ct Head Code Stroke W/o Cm 01/02/2016 1. Acute LEFT hemisphere affecting the insula and LEFT temporal lobe.  2. ASPECTS is 8.  3. Hyperdense LEFT MCA M2/M3 vessel in the sylvian fissure. Subtle asymmetric hyperdensity of the LEFT ICA terminus and LEFT M1 MCA could signify a more proximal thrombosis. CTA head/neck is pending.   Cerebral angio 1.distal  LT MCA inferior division angular branch occlusion ,associated with a mod sized area of  absent perfusion  of the left parietal cortical subcortical junction.  2-D Echo  - Left ventricle: The cavity size was normal. Systolic function was normal. The estimated ejection fraction was in the range of 60% to 65%. Wall motion was normal; there were  no regional wall motion abnormalities. Left ventricular diastolic function parameters were normal.  LE venous Doppler negative for DVT   PHYSICAL EXAM Constitutional: Appears well-developed and well-nourished.  Psych: Affect appropriate to situation Eyes: No scleral injection HENT: No OP obstrucion Head: Normocephalic.  Cardiovascular: Normal rate and regular rhythm.  Respiratory: Effort normal and breath sounds normal to anterior ascultation GI: Soft.  No distension. There is no tenderness.  Skin: WDI  Neuro: Mental Status: Patient is awake, alert, oriented to person, place,  month, year, and situation. Patient is able to give a clear and coherent history. No signs of neglect. She has a mild expressive aphasia, with intermittent hesitancy, naming and  and word finding difficulties Cranial Nerves: II: Visual Fields are full. Pupils are equal, round, and reactive to light.   III,IV, VI: EOMI without ptosis or diploplia.  V: Facial sensation is symmetric to temperature VII: Facial movement is notable for very rough mild right facial droop VIII: hearing is intact to voice X: Uvula elevates symmetrically XI: Shoulder shrug is symmetric. XII: tongue is midline without atrophy or fasciculations.  Motor: Tone is normal. Bulk is normal. 5/5 strength was present in all four extremities.  Sensory: Sensation is symmetric to light touch and temperature in the arms and legs. Cerebellar: FNF and HKS are intact bilaterally   ASSESSMENT/PLAN [name] is a 60 y.o. female with history of diabetes and hypertension presenting with aphasia. She did not receive IV t-PA due to late presentation.  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  Resultant  mild expressive aphasia  MRI - Small to slightly moderate-size acute left parietal lobe infarct  CTA Head and neck - Occluded left M3.   2D Echo - EF 60-65%. No source of embolus   LE Venous Dopplers - negative for DVT  TEE unable to be done today due to scheduling. Able to be scheduled as an IP on Wednedsay at 9a. Discussed with Pt/family. They prefer to stay in hospital until testing completed.   Consider Loop recorder to rule out A. fib if above findings negative.  LDL - 68  HgbA1c 6.7  VTE prophylaxis - Lovenox Diet Carb Modified Fluid consistency: Thin; Room service appropriate? Yes Diet NPO time specified Except for: Sips with Meds  aspirin 81 mg daily prior to admission, switched to clopidogrel 75 mg daily for stroke prevention.  Patient counseled to be compliant with her  antithrombotic medications  Ongoing aggressive stroke risk factor management  Therapy recommendations: Outpatient PT recommended, intermittent supervision. no OT   Disposition:  Anticipate discharge home following TEE on Wed  Blood Pressure  normal   Hyperlipidemia  Home meds:  No lipid lowering medications prior to admission  LDL 68, at goal < 70  Added Lipitor 10 for stroke prevention  Diabetes type II without complication  HgbA1c 6.7, goal < 7.0  Controlled  Other Stroke Risk Factors  Advanced age  Obesity, Body mass index is 30.39 kg/m., recommend weight loss, diet and exercise as appropriate   Other Active Problems  Gastritis, diarrhea 2 days before the current stroke  Hospital day # 4  [facility] See Amion for Pager information 01/06/2016 1:42 PM  I have personally examined this patient, reviewed notes, independently viewed imaging studies, participated in medical decision making and plan of care.ROS completed by me personally and pertinent positives fully documented  I have made any additions or clarifications directly to the above note. Agree  with note above. Greater than 50% time during this 25 minute visit was spent on counseling and coordination of care about her TEE   Delia Heady, MD Medical Director [facility] Pager: [phone] 01/06/2016 2:51 PM  To contact Stroke Continuity provider, please refer to [email or web address]. After hours, contact General Neurology

## 2016-01-06 NOTE — Care Management Note (Signed)
Case Management Note  Patient Details  Name: Caroline [name] MRN: [id number] Date of Birth: [birth year]  Subjective/Objective:        Patient admitted with CVA. She is from home with her spouse.            Action/Plan: PT recommending outpatient therapy. CM following for d/c needs.  Expected Discharge Date:  01/05/16               Expected Discharge Plan:  Home/Self Care  In-House Referral:     Discharge planning Services     Post Acute Care Choice:    Choice offered to:     DME Arranged:    DME Agency:     HH Arranged:    HH Agency:     Status of Service:  In process, will continue to follow  If discussed at Long Length of Stay Meetings, dates discussed:    Additional Comments:  Kermit BaloKelli F Zolton Dowson, RN 01/06/2016, 7:49 PM

## 2016-01-06 NOTE — Evaluation (Signed)
Speech Language Pathology Evaluation Patient Details Name: Caroline [name] MRN: [id number] DOB: Nov 02, 1955 Today's Date: 01/06/2016 Time:  -     Problem List:  Patient Active Problem List   Diagnosis Date Noted  . Stroke (cerebrum) (HCC) 01/02/2016   Past Medical History:  Past Medical History:  Diagnosis Date  . Diabetes mellitus without complication (HCC)   . Reflux    Past Surgical History:  Past Surgical History:  Procedure Laterality Date  . ABDOMINAL HYSTERECTOMY    . APPENDECTOMY    . IR GENERIC HISTORICAL  01/02/2016   IR ANGIO VERTEBRAL SEL VERTEBRAL UNI L MOD SED 01/02/2016 Julieanne CottonSanjeev Deveshwar, MD [facility]-INTER[facility]  . IR GENERIC HISTORICAL  01/02/2016   IR ANGIO INTRA EXTRACRAN SEL COM CAROTID INNOMINATE UNI L MOD SED 01/02/2016 Julieanne CottonSanjeev Deveshwar, MD [facility]  . IR GENERIC HISTORICAL  01/02/2016   IR ANGIO EXTRACRAN SEL COM CAROTID INNOMINATE UNI R MOD SED 01/02/2016 Julieanne CottonSanjeev Deveshwar, MD [facility]  . RADIOLOGY WITH ANESTHESIA N/A 01/02/2016   Procedure: RADIOLOGY WITH ANESTHESIA;  Surgeon: Medication Radiologist, MD;  Location: [facility] OR;  Service: Radiology;  Laterality: N/A;   HPI:  60 y.o.femalewith a history of diabetes, hypertension who presents with aphasia that started this morning. MRI small to slightly moderate-size acute left parietal lobe infarct. Per chart pt  taken for angiogram which demonstrated a lesion that was not amenable to mechanical thrombectomy and therefore no further action was taken.   Assessment / Plan / Recommendation Clinical Impression  Pt's language abilities have improved per pt and family from initial symptoms. Currently she exhibits mild-moderate Broca's aphasia with basic comprehension in tact and mild impairment with abstract language for auditory comprehension. Expressive language is mostly fluent with intermitten pauses and hesitations, dysnomia difficulty fluently with explanations primarily with longer utterances and abstract  thoughts/ideas. SLP introduced and and educated pt/family re: strategies to facilitate dysnomia. Pt would benefit from evaluation and treatment at an outpatient center for facilitation of language abilities. Family reports she is having difficulty with selective attention abilities.       SLP Assessment  Patient needs continued Speech Lanaguage Pathology Services    Follow Up Recommendations  Outpatient SLP    Frequency and Duration min 2x/week  2 weeks      SLP Evaluation Cognition  Overall Cognitive Status: Impaired/Different from baseline Arousal/Alertness: Awake/alert Orientation Level: Oriented X4 Attention: Sustained Sustained Attention: Impaired Sustained Attention Impairment: Verbal basic;Functional basic Memory: Appears intact Awareness: Appears intact Problem Solving: Appears intact Safety/Judgment: Appears intact       Comprehension  Auditory Comprehension Overall Auditory Comprehension: Impaired Yes/No Questions: Impaired Basic Immediate Environment Questions: 75-100% accurate (90%) Commands: Impaired Multistep Basic Commands: 75-100% accurate (80%) Interfering Components: Attention (has hearing aids) EffectiveTechniques: Repetition;Extra processing time Visual Recognition/Discrimination Discrimination: Not tested Reading Comprehension Reading Status: Within funtional limits    Expression Expression Primary Mode of Expression: Verbal Verbal Expression Overall Verbal Expression: Impaired Initiation: No impairment Level of Generative/Spontaneous Verbalization: Conversation;Sentence Repetition: No impairment Naming: No impairment (100% common objects) Pragmatics: No impairment Written Expression Dominant Hand: Left Written Expression: Within Functional Limits   Oral / Motor  Oral Motor/Sensory Function Overall Oral Motor/Sensory Function: Within functional limits Motor Speech Overall Motor Speech: Appears within functional limits for tasks  assessed Respiration: Within functional limits Phonation: Normal Resonance: Within functional limits Articulation: Within functional limitis Intelligibility: Intelligible Motor Planning: Witnin functional limits   GO  Royce Macadamia 01/06/2016, 2:48 PM   Breck Coons Lonell Face[employer] [phone]

## 2016-01-07 ENCOUNTER — Encounter (HOSPITAL_COMMUNITY): Payer: Self-pay

## 2016-01-07 DIAGNOSIS — I6602 Occlusion and stenosis of left middle cerebral artery: Secondary | ICD-10-CM

## 2016-01-07 LAB — GLUCOSE, CAPILLARY
GLUCOSE-CAPILLARY: 119 mg/dL — AB (ref 65–99)
GLUCOSE-CAPILLARY: 104 mg/dL — AB (ref 65–99)
Glucose-Capillary: 109 mg/dL — ABNORMAL HIGH (ref 65–99)
Glucose-Capillary: 105 mg/dL — ABNORMAL HIGH (ref 65–99)

## 2016-01-07 MED ORDER — BISACODYL 10 MG RE SUPP
10.0000 mg | Freq: Once | RECTAL | Status: AC
  Administered 2016-01-07: 10 mg via RECTAL
  Filled 2016-01-07: qty 1

## 2016-01-07 MED ORDER — SODIUM CHLORIDE 0.9 % IV SOLN
INTRAVENOUS | Status: DC
  Administered 2016-01-08 (×2): via INTRAVENOUS

## 2016-01-07 NOTE — Care Management Note (Signed)
Case Management Note  Patient Details  Name: Caroline [name] MRN: [id number] Date of Birth: 12/30/1955  Subjective/Objective:                    Action/Plan: CM consulted for outpatient therapy. CM met with the patient and her husband and asked about rehab in [place] or [place]. They prefer [facility]. Orders placed in EPIC and information on the AVS.   Expected Discharge Date:  01/05/16               Expected Discharge Plan:  Home/Self Care  In-House Referral:     Discharge planning Services     Post Acute Care Choice:    Choice offered to:     DME Arranged:    DME Agency:     HH Arranged:    HH Agency:     Status of Service:  In process, will continue to follow  If discussed at Long Length of Stay Meetings, dates discussed:    Additional Comments:  Pollie Friar, RN 01/07/2016, 3:59 PM

## 2016-01-07 NOTE — Progress Notes (Signed)
STROKE TEAM PROGRESS NOTE   SUBJECTIVE (INTERVAL HISTORY) Family member (? Mother) is at the bedside. Patient reports still having difficulty with remembering some words. Confirmed with her that TEE is still planned for 0900 tomorrow. No change in condition over night. Stable. Dr. Sethi discussed/educated loop recorder and plans for monitoring with her.   OBJECTIVE Temp:  [97.7 F (36.5 C)-98.5 F (36.9 C)] 98.3 F (36.8 C) (09/19 0944) Pulse Rate:  [52-65] 65 (09/19 0944) Cardiac Rhythm: Normal sinus rhythm (09/18 1900) Resp:  [18-20] 20 (09/19 0944) BP: (101-121)/(69-77) 101/71 (09/19 0944) SpO2:  [97 %-100 %] 98 % (09/19 0944)  CBC:   Recent Labs Lab 01/02/16 1152 01/02/16 1156  WBC 7.2  --   NEUTROABS 4.6  --   HGB 15.1* 16.3*  HCT 47.9* 48.0*  MCV 97.4  --   PLT 169  --     Basic Metabolic Panel:   Recent Labs Lab 01/02/16 1152 01/02/16 1156  NA 138 138  K 3.8 4.0  CL 105 103  CO2 22  --   GLUCOSE 158* 154*  BUN 8 10  CREATININE 0.89 0.90  CALCIUM 9.2  --     Lipid Panel:     Component Value Date/Time   CHOL 136 01/03/2016 0753   TRIG 163 (H) 01/03/2016 0753   HDL 35 (L) 01/03/2016 0753   CHOLHDL 3.9 01/03/2016 0753   VLDL 33 01/03/2016 0753   LDLCALC 68 01/03/2016 0753   HgbA1c:  Lab Results  Component Value Date   HGBA1C 6.7 (H) 01/03/2016   Urine Drug Screen:     Component Value Date/Time   LABOPIA NONE DETECTED 01/02/2016 1949   COCAINSCRNUR NONE DETECTED 01/02/2016 1949   LABBENZ NONE DETECTED 01/02/2016 1949   AMPHETMU NONE DETECTED 01/02/2016 1949   THCU NONE DETECTED 01/02/2016 1949   LABBARB NONE DETECTED 01/02/2016 1949      IMAGING  TEE pending    PHYSICAL EXAM Constitutional: Appears well-developed and well-nourished.  Psych: Affect appropriate to situation Eyes: No scleral injection HENT: No OP obstrucion Head: Normocephalic.  Cardiovascular: Normal rate and regular rhythm.  Respiratory: Effort normal and breath  sounds normal to anterior ascultation GI: Soft.  No distension. There is no tenderness.  Skin: WDI  Neuro: Mental Status: Patient is awake, alert, oriented to person, place, month, year, and situation. Patient is able to give a clear and coherent history. No signs of neglect. She has a mild expressive aphasia, with intermittent hesitancy, naming and  and word finding difficulties Cranial Nerves: II: Visual Fields are full. Pupils are equal, round, and reactive to light.   III,IV, VI: EOMI without ptosis or diploplia.  V: Facial sensation is symmetric to temperature VII: Facial movement is notable for very rough mild right facial droop VIII: hearing is intact to voice X: Uvula elevates symmetrically XI: Shoulder shrug is symmetric. XII: tongue is midline without atrophy or fasciculations.  Motor: Tone is normal. Bulk is normal. 5/5 strength was present in all four extremities.  Sensory: Sensation is symmetric to light touch and temperature in the arms and legs. Cerebellar: FNF and HKS are intact bilaterally   ASSESSMENT/PLAN Caroline [name] is a 60 y.o. female with history of diabetes and hypertension presenting with aphasia. She did not receive IV t-PA due to late presentation.  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  Resultant  mild expressive aphasia  MRI - Small to slightly moderate-size acute left parietal lobe infarct    CTA Head and neck - Occluded left M3.   2D Echo - EF 60-65%. No source of embolus   LE Venous Dopplers - negative for DVT  TEE scheduled as an IP on Wednedsay at 9a. .   Loop recorder to rule out A. fib if above findings negative.  LDL - 68  HgbA1c 6.7  VTE prophylaxis - Lovenox Diet Carb Modified Fluid consistency: Thin; Room service appropriate? Yes Diet NPO time specified Except for: Sips with Meds  aspirin 81 mg daily prior to admission, switched to clopidogrel 75 mg daily for stroke prevention.  Patient  counseled to be compliant with her antithrombotic medications  Ongoing aggressive stroke risk factor management  Therapy recommendations: Outpatient PT recommended, intermittent supervision. no OT. OP ST (ordered)  Disposition:  Anticipate discharge home following TEE on Wed  Blood Pressure  normal   Hyperlipidemia  Home meds:  No lipid lowering medications prior to admission  LDL 68, at goal < 70  Added Lipitor 10 for stroke prevention  Diabetes type II without complication  HgbA1c 6.7, goal < 7.0  Controlled  Other Stroke Risk Factors  Advanced age  Obesity, Body mass index is 30.39 kg/m., recommend weight loss, diet and exercise as appropriate   Other Active Problems  Gastritis, diarrhea 2 days before the current stroke  Hospital day # 5  BIBY,SHARON  [facility] See Amion for Pager information 01/07/2016 10:25 AM  I have personally examined this patient, reviewed notes, independently viewed imaging studies, participated in medical decision making and plan of care.ROS completed by me personally and pertinent positives fully documented  I have made any additions or clarifications directly to the above note. Agree with note above. Greater than 50% time during this 25 minute visit was spent on counseling and coordination of care about stroke risk, TEE, loop recorder testing and answering questions  Pramod Sethi, MD Medical Director [facility] Pager: [phone] 01/07/2016 4:42 PM  To contact Stroke Continuity provider, please refer to [email or web address]. After hours, contact General Neurology 

## 2016-01-07 NOTE — Progress Notes (Signed)
    [facility] has been requested to perform a transesophageal echocardiogram on 01/08/16 for [place].  After careful review of history and examination, the risks and benefits of transesophageal echocardiogram have been explained including risks of esophageal damage, perforation (1:10,000 risk), bleeding, pharyngeal hematoma as well as other potential complications associated with conscious sedation including aspiration, arrhythmia, respiratory failure and death. Alternatives to treatment were discussed, questions were answered. Patient is willing to proceed. I have reviewed the patients labs and vital signs, both are stable to proceed forward with procedure tomorrow.  Laverda PageLindsay Roberts, NP-C 01/07/2016 3:45 PM

## 2016-01-07 NOTE — Progress Notes (Signed)
Physical Therapy Treatment Patient Details Name: Caroline JarredDonna F Fullard MRN: [id number] DOB: 13-Mar-1956 Today's Date: 01/07/2016    History of Present Illness Patient is a 60 y/o female with hx of DM and HTN who presents with aphasia. Head CT-Acute LEFT hemisphere affecting the insula and LEFT temporal lobe. MRI- + Small to slightly moderate-size acute left parietal lobe infarct. CT angiogram-Occluded left middle cerebral artery M2 branch     PT Comments    Patient continues to demo word finding difficulties and balance deficits. Current plan remains appropriate.   Follow Up Recommendations  Outpatient PT;Supervision - Intermittent     Equipment Recommendations  None recommended by PT    Recommendations for Other Services       Precautions / Restrictions Precautions Precautions: Fall Restrictions Weight Bearing Restrictions: No    Mobility  Bed Mobility Overal bed mobility: Needs Assistance Bed Mobility: Supine to Sit     Supine to sit: Modified independent (Device/Increase time);HOB elevated        Transfers Overall transfer level: Needs assistance Equipment used: None Transfers: Sit to/from Stand Sit to Stand: Supervision         General transfer comment: supervision for safety  Ambulation/Gait Ambulation/Gait assistance: Min guard;Min assist Ambulation Distance (Feet): 350 Feet Assistive device: None;1 person hand held assist Gait Pattern/deviations: Step-through pattern;Decreased stride length Gait velocity: decreased   General Gait Details: min guard for safety with pt able to tolerate balance activities with no LOB; pt demonstrated increased instability and mild staggering R/L when ambulating in open hallway with large windows and increased visual stimulation and required HHA for balance   Stairs   Stairs assistance: Supervision Stair Management: No rails;Forwards;Alternating pattern;Step to pattern Number of Stairs:  (2 and 3) General stair comments:  no rails; alternating pattern to ascend and step to pattern to descend  Wheelchair Mobility    Modified Rankin (Stroke Patients Only) Modified Rankin (Stroke Patients Only) Pre-Morbid Rankin Score: No symptoms Modified Rankin: Moderate disability     Balance     Sitting balance-Leahy Scale: Normal       Standing balance-Leahy Scale: Good Standing balance comment: fair with increased visual stimulation                    Cognition Arousal/Alertness: Awake/alert Behavior During Therapy: WFL for tasks assessed/performed Overall Cognitive Status: Within Functional Limits for tasks assessed                 General Comments: pt continues to have word finding difficulties    Exercises      General Comments        Pertinent Vitals/Pain Pain Assessment: Faces Faces Pain Scale: Hurts a little bit Pain Location: head with stair management and "trying to tell a story" Pain Descriptors / Indicators: Headache Pain Intervention(s): Limited activity within patient's tolerance;Monitored during session;Repositioned    Home Living                      Prior Function            PT Goals (current goals can now be found in the care plan section) Acute Rehab PT Goals Patient Stated Goal: to get back to independence Progress towards PT goals: Progressing toward goals    Frequency    Min 4X/week      PT Plan Current plan remains appropriate    Co-evaluation             End of Session Equipment  Utilized During Treatment: Gait belt Activity Tolerance: Patient tolerated treatment well Patient left: in chair;with call bell/phone within reach;with family/visitor present     Time: 1610-9604 PT Time Calculation (min) (ACUTE ONLY): 17 min  Charges:  $Gait Training: 8-22 mins                    G Codes:      Derek Mound, PTA Pager: [phone]   01/07/2016, 3:01 PM

## 2016-01-08 ENCOUNTER — Encounter (HOSPITAL_COMMUNITY)
Admission: EM | Disposition: A | Discharge status: Home / Self Care | Payer: Self-pay | Source: Home / Self Care | Attending: Neurology

## 2016-01-08 ENCOUNTER — Inpatient Hospital Stay (HOSPITAL_COMMUNITY): Payer: BLUE CROSS/BLUE SHIELD

## 2016-01-08 ENCOUNTER — Encounter (HOSPITAL_COMMUNITY): Payer: Self-pay | Admitting: *Deleted

## 2016-01-08 DIAGNOSIS — Q211 Atrial septal defect: Secondary | ICD-10-CM

## 2016-01-08 DIAGNOSIS — I639 Cerebral infarction, unspecified: Secondary | ICD-10-CM

## 2016-01-08 DIAGNOSIS — I669 Occlusion and stenosis of unspecified cerebral artery: Secondary | ICD-10-CM

## 2016-01-08 HISTORY — PX: TEE WITHOUT CARDIOVERSION: SHX5443

## 2016-01-08 HISTORY — PX: EP IMPLANTABLE DEVICE: SHX172B

## 2016-01-08 LAB — GLUCOSE, CAPILLARY
GLUCOSE-CAPILLARY: 126 mg/dL — AB (ref 65–99)
GLUCOSE-CAPILLARY: 151 mg/dL — AB (ref 65–99)

## 2016-01-08 SURGERY — LOOP RECORDER INSERTION

## 2016-01-08 SURGERY — ECHOCARDIOGRAM, TRANSESOPHAGEAL
Anesthesia: Moderate Sedation

## 2016-01-08 MED ORDER — LIDOCAINE-EPINEPHRINE 1 %-1:100000 IJ SOLN
INTRAMUSCULAR | Status: AC
  Filled 2016-01-08: qty 1

## 2016-01-08 MED ORDER — MIDAZOLAM HCL 10 MG/2ML IJ SOLN
INTRAMUSCULAR | Status: DC | PRN
  Administered 2016-01-08: 2 mg via INTRAVENOUS
  Administered 2016-01-08: 1 mg via INTRAVENOUS
  Administered 2016-01-08: 2 mg via INTRAVENOUS

## 2016-01-08 MED ORDER — LIDOCAINE-EPINEPHRINE 1 %-1:100000 IJ SOLN
INTRAMUSCULAR | Status: DC | PRN
  Administered 2016-01-08: 20 mL

## 2016-01-08 MED ORDER — ONDANSETRON HCL 4 MG/2ML IJ SOLN: 4.0000 mg | Freq: Four times a day (QID) | INTRAMUSCULAR | Status: DC | PRN

## 2016-01-08 MED ORDER — BUTAMBEN-TETRACAINE-BENZOCAINE 2-2-14 % EX AERO
INHALATION_SPRAY | CUTANEOUS | Status: DC | PRN
  Administered 2016-01-08: 2 via TOPICAL

## 2016-01-08 MED ORDER — ATORVASTATIN CALCIUM 10 MG PO TABS: 10.0000 mg | ORAL_TABLET | Freq: Every day | ORAL | 3 refills | Status: DC

## 2016-01-08 MED ORDER — MIDAZOLAM HCL 5 MG/ML IJ SOLN
INTRAMUSCULAR | Status: AC
  Filled 2016-01-08: qty 2

## 2016-01-08 MED ORDER — ACETAMINOPHEN 325 MG PO TABS: 325.0000 mg | ORAL_TABLET | ORAL | Status: DC | PRN

## 2016-01-08 MED ORDER — CLOPIDOGREL BISULFATE 75 MG PO TABS: 75.0000 mg | ORAL_TABLET | Freq: Every day | ORAL | 3 refills | Status: DC

## 2016-01-08 MED ORDER — FENTANYL CITRATE (PF) 100 MCG/2ML IJ SOLN
INTRAMUSCULAR | Status: DC | PRN
  Administered 2016-01-08: 50 ug via INTRAVENOUS
  Administered 2016-01-08: 25 ug via INTRAVENOUS

## 2016-01-08 MED ORDER — FENTANYL CITRATE (PF) 100 MCG/2ML IJ SOLN
INTRAMUSCULAR | Status: AC
  Filled 2016-01-08: qty 2

## 2016-01-08 SURGICAL SUPPLY — 2 items
LOOP REVEAL LINQSYS (Prosthesis & Implant Heart) ×3 IMPLANT
PACK LOOP INSERTION (CUSTOM PROCEDURE TRAY) ×3 IMPLANT

## 2016-01-08 NOTE — Interval H&P Note (Signed)
History and Physical Interval Note:  01/08/2016 9:14 AM  Caroline [name]  has presented today for surgery, with the diagnosis of Stroke  The various methods of treatment have been discussed with the patient and family. After consideration of risks, benefits and other options for treatment, the patient has consented to  Procedure(s): TRANSESOPHAGEAL ECHOCARDIOGRAM (TEE) (N/A) as a surgical intervention .  The patient's history has been reviewed, patient examined, no change in status, stable for surgery.  I have reviewed the patient's chart and labs.  Questions were answered to the patient's satisfaction.     Nelly Scriven [employer]

## 2016-01-08 NOTE — Consult Note (Signed)
ELECTROPHYSIOLOGY CONSULT NOTE  Patient ID: Caroline [name] MRN: [id number], DOB/AGE: 07/13/1955   Admit date: 01/02/2016 Date of Consult: 01/08/2016  Primary Physician: [facility] Primary Cardiologist: new to [facility] Reason for Consultation: Cryptogenic stroke; recommendations regarding Implantable Loop Recorder  History of Present Illness Caroline [name] was admitted on 01/02/2016 with aphasia.  Imaging demonstrated left MCA cortical infarct felt to be embolic 2/2 unknown source.  She has undergone workup for stroke including echocardiogram and carotid imaging.  The patient has been monitored on telemetry which has demonstrated sinus rhythm with no arrhythmias.  Inpatient stroke work-up is to be completed with a TEE.   Echocardiogram this admission demonstrated EF 60-65%, no RWMA, LA 29.  Lab work is reviewed.  Prior to admission, the patient denies chest pain, shortness of breath, dizziness, palpitations, or syncope.  They are recovering from their stroke with plans to return home at discharge.  EP has been asked to evaluate for placement of an implantable loop recorder to monitor for atrial fibrillation.   Past Medical History:  Diagnosis Date  . Diabetes mellitus without complication (HCC)   . Reflux      Surgical History:  Past Surgical History:  Procedure Laterality Date  . ABDOMINAL HYSTERECTOMY    . APPENDECTOMY    . IR GENERIC HISTORICAL  01/02/2016   IR ANGIO VERTEBRAL SEL VERTEBRAL UNI L MOD SED 01/02/2016 Julieanne Cotton, MD [facility]  . IR GENERIC HISTORICAL  01/02/2016   IR ANGIO INTRA EXTRACRAN SEL COM CAROTID INNOMINATE BILAT MOD SED 01/02/2016 Julieanne Cotton, MD [facility]  . IR GENERIC HISTORICAL  01/02/2016   IR ANGIO VERTEBRAL SEL SUBCLAVIAN INNOMINATE UNI R MOD SED 01/02/2016 Julieanne Cotton, MD [facility]  . RADIOLOGY WITH ANESTHESIA N/A 01/02/2016   Procedure: RADIOLOGY WITH ANESTHESIA;  Surgeon: Medication  Radiologist, MD;  Location: [facility];  Service: Radiology;  Laterality: N/A;     Prescriptions Prior to Admission  Medication Sig Dispense Refill Last Dose  . aspirin 81 MG chewable tablet Chew 81 mg by mouth daily.   01/01/2016 at Unknown time  . co-enzyme Q-10 30 MG capsule Take 30 mg by mouth 3 (three) times daily.   01/01/2016 at Unknown time  . estradiol (ESTRACE) 1 MG tablet Take 1 mg by mouth daily.   01/01/2016 at Unknown time  . losartan (COZAAR) 25 MG tablet Take 25 mg by mouth daily.   01/01/2016 at Unknown time  . omeprazole (PRILOSEC) 40 MG capsule Take 40 mg by mouth daily.   01/01/2016 at Unknown time  . valACYclovir (VALTREX) 500 MG tablet Take 500 mg by mouth 2 (two) times daily.   01/01/2016 at Unknown time  . HYDROcodone-acetaminophen (NORCO) 5-325 MG per tablet Take 1 tablet by mouth every 4 (four) hours as needed for pain. (Patient not taking: Reported on 01/02/2016) 15 tablet 0 Not Taking at Unknown time  . ondansetron (ZOFRAN ODT) 4 MG disintegrating tablet 4mg  ODT q4 hours prn nausea/vomit (Patient not taking: Reported on 01/02/2016) 4 tablet 0 Not Taking at Unknown time    Inpatient Medications:  . atorvastatin  10 mg Oral q1800  . clopidogrel  75 mg Oral Daily  . enoxaparin (LOVENOX) injection  40 mg Subcutaneous Q24H  . insulin aspart  0-15 Units Subcutaneous TID WC  . loratadine  10 mg Oral Daily    Allergies: No Known Allergies  Social History   Social History  . Marital status: Married    Spouse name:  N/A  . Number of children: N/A  . Years of education: N/A   Occupational History  . Not on file.   Social History Main Topics  . Smoking status: Never Smoker  . Smokeless tobacco: Never Used  . Alcohol use No  . Drug use: No  . Sexual activity: Not on file   Other Topics Concern  . Not on file   Social History Narrative  . No narrative on file     Family History: mom has pacemaker   Review of Systems: All other systems reviewed and are otherwise  negative except as noted above.  Physical Exam: Vitals:   01/07/16 1828 01/07/16 2059 01/08/16 0119 01/08/16 0600  BP: 115/72 127/77 123/74 108/72  Pulse: 65 61 65 (!) 58  Resp: 20 18 18 18   Temp: 98.2 F (36.8 C) 98.2 F (36.8 C) 98 F (36.7 C) 97.9 F (36.6 C)  TempSrc: Oral Oral Oral Oral  SpO2: 98% 97% 98% 97%  Weight:      Height:        GEN- The patient is well appearing, alert and oriented x 3 today.   Head- normocephalic, atraumatic Eyes-  Sclera clear, conjunctiva pink Ears- hearing intact Oropharynx- clear Neck- supple Lungs- Clear to ausculation bilaterally, normal work of breathing Heart- Regular rate and rhythm, no murmurs, rubs or gallops  GI- soft, NT, ND, + BS Extremities- no clubbing, cyanosis, or edema MS- no significant deformity or atrophy Skin- no rash or lesion Psych- euthymic mood, full affect   Labs:   Lab Results  Component Value Date   WBC 7.2 01/02/2016   HGB 16.3 (H) 01/02/2016   HCT 48.0 (H) 01/02/2016   MCV 97.4 01/02/2016   PLT 169 01/02/2016    Recent Labs Lab 01/02/16 1152 01/02/16 1156  NA 138 138  K 3.8 4.0  CL 105 103  CO2 22  --   BUN 8 10  CREATININE 0.89 0.90  CALCIUM 9.2  --   PROT 7.0  --   BILITOT 0.8  --   ALKPHOS 63  --   ALT 30  --   AST 27  --   GLUCOSE 158* 154*     Radiology/Studies: Ct Angio Head W Or Wo Contrast Result Date: 01/02/2016 CLINICAL DATA:  60 year old diabetic female with dysphaia. Subsequent encounter. EXAM: CT ANGIOGRAPHY HEAD AND NECK TECHNIQUE: Multidetector CT imaging of the head and neck was performed using the standard protocol during bolus administration of intravenous contrast. Multiplanar CT image reconstructions and MIPs were obtained to evaluate the vascular anatomy. Carotid stenosis measurements (when applicable) are obtained utilizing NASCET criteria, using the distal internal carotid diameter as the denominator. CONTRAST:  50 cc Isovue 370. COMPARISON:  None. FINDINGS: CT HEAD  Brain: Possible early infarct left subinsular/ operculum region. No intracranial hemorrhage. No intracranial enhancing lesion. Calvarium and skull base: Negative Paranasal sinuses: Partial opacification inferior left maxillary sinus Orbits: No acute orbital abnormality. CTA NECK Aortic arch: 4 vessel arch with left vertebral artery arising directly from the arch. Right carotid system: No significant stenosis. Left carotid system: No significant stenosis. Vertebral arteries:Left vertebral artery is dominant. Skeleton: Negative. Other neck: No worrisome mass. Upper chest: No worrisome lung apical lesion. CTA HEAD Anterior circulation: No significant stenosis left carotid terminus or M1 segment of the left middle cerebral artery. 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 Fetal origin of the posterior cerebral arteries. Posterior circulation: Right vertebral artery ends in  a posterior inferior cerebellar artery distribution. Venous sinuses: Patent Anatomic variants: As above Delayed phase: As above IMPRESSION: CT HEAD Possible early infarct left subinsular/ operculum region. CTA HEAD No significant stenosis left carotid terminus or M1 segment of the left middle cerebral artery. 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 Fetal origin of posterior cerebral arteries. Right vertebral artery ends in a posterior inferior cerebellar artery distribution. CTA NECK 4 vessel arch with left vertebral artery arising directly from the arch. No significant stenosis of either carotid bifurcation. Images reviewed with Dr. Amada JupiterKirkpatrick at the time of scanning. Electronically Signed   By: Lacy DuverneySteven  Olson M.D.   On: 01/02/2016 12:31   12-lead ECG sinus rhythm, PAC's All prior EKG's in EPIC reviewed with no documented atrial fibrillation  Telemetry sinus rhythm, PAC's, PVC's  Assessment and Plan:  1. Cryptogenic  stroke The patient presents with cryptogenic stroke.  The patient has a TEE planned for this AM.  I spoke at length with the patient about monitoring for afib with an implantable loop recorder.  Risks, benefits, and alteratives to implantable loop recorder were discussed with the patient today.   At this time, the patient is very clear in their decision to proceed with implantable loop recorder.   Wound care was reviewed with the patient (keep incision clean and dry for 3 days).  Wound check scheduled for 01/20/16 at 11:30AM  Please call with questions.   Gypsy BalsamAmber Seiler, NP 01/08/2016 7:21 AM  EP Attending  Patient seen and examined. Agree with above. I have discussed the situation with the patient. The risk/benefits/goals/expectations of insertion of an ILR have been discussed and she wishes to proceed.  Leonia ReevesGregg Dub Maclellan,M.D.

## 2016-01-08 NOTE — Discharge Summary (Signed)
Stroke Discharge Summary  Patient ID: Caroline [name]   MRN: [id number]      DOB: Jul 04, 1955  Date of Admission: 01/02/2016 Date of Discharge: 01/08/2016  Attending Physician:  Garvin Fila, MD, Stroke MD Consulting Physician(s):    cardiology Dr. Cristopher Peru Patient's PCP:  [facility]  DISCHARGE DIAGNOSIS: Left MCA cortical infarct - embolic - unknown etiology. Active Problems:   Stroke (cerebrum) (HCC)   Left middle cerebral artery embolism  BMI: Body mass index is 30.39 kg/m.  Past Medical History:  Diagnosis Date  . Diabetes mellitus without complication ([place])   . Reflux    Past Surgical History:  Procedure Laterality Date  . ABDOMINAL HYSTERECTOMY    . APPENDECTOMY    . EP IMPLANTABLE DEVICE N/A 01/08/2016   Procedure: Loop Recorder Insertion;  Surgeon: Evans Lance, MD;  Location: [facility];  Service: Cardiovascular;  Laterality: N/A;  . IR GENERIC HISTORICAL  01/02/2016   IR ANGIO VERTEBRAL SEL VERTEBRAL UNI L MOD SED 01/02/2016 Luanne Bras, MD [facility]  . IR GENERIC HISTORICAL  01/02/2016   IR ANGIO INTRA EXTRACRAN SEL COM CAROTID INNOMINATE BILAT MOD SED 01/02/2016 Luanne Bras, MD [facility]  . IR GENERIC HISTORICAL  01/02/2016   IR ANGIO VERTEBRAL SEL SUBCLAVIAN INNOMINATE UNI R MOD SED 01/02/2016 Luanne Bras, MD [facility]  . RADIOLOGY WITH ANESTHESIA N/A 01/02/2016   Procedure: RADIOLOGY WITH ANESTHESIA;  Surgeon: Medication Radiologist, MD;  Location: [facility];  Service: Radiology;  Laterality: N/A;      Medication List    STOP taking these medications   aspirin 81 MG chewable tablet   ESTRACE 1 MG tablet Generic drug:  estradiol   losartan 25 MG tablet Commonly known as:  COZAAR     TAKE these medications   atorvastatin 10 MG tablet Commonly known as:  LIPITOR Take 1 tablet (10 mg total) by mouth daily at 6 PM.   clopidogrel 75 MG tablet Commonly known as:  PLAVIX Take 1 tablet (75  mg total) by mouth daily. Start taking on:  01/09/2016   co-enzyme Q-10 30 MG capsule Take 30 mg by mouth 3 (three) times daily.   HYDROcodone-acetaminophen 5-325 MG tablet Commonly known as:  NORCO Take 1 tablet by mouth every 4 (four) hours as needed for pain.   omeprazole 40 MG capsule Commonly known as:  PRILOSEC Take 40 mg by mouth daily.   ondansetron 4 MG disintegrating tablet Commonly known as:  ZOFRAN ODT 55m ODT q4 hours prn nausea/vomit   valACYclovir 500 MG tablet Commonly known as:  VALTREX Take 500 mg by mouth 2 (two) times daily.       LABORATORY STUDIES CBC    Component Value Date/Time   WBC 7.2 01/02/2016 1152   RBC 4.92 01/02/2016 1152   HGB 16.3 (H) 01/02/2016 1156   HCT 48.0 (H) 01/02/2016 1156   PLT 169 01/02/2016 1152   MCV 97.4 01/02/2016 1152   MCH 30.7 01/02/2016 1152   MCHC 31.5 01/02/2016 1152   RDW 13.7 01/02/2016 1152   LYMPHSABS 1.8 01/02/2016 1152   MONOABS 0.6 01/02/2016 1152   EOSABS 0.3 01/02/2016 1152   BASOSABS 0.0 01/02/2016 1152   CMP    Component Value Date/Time   NA 138 01/02/2016 1156   K 4.0 01/02/2016 1156   CL 103 01/02/2016 1156   CO2 22 01/02/2016 1152   GLUCOSE 154 (H) 01/02/2016 1156   BUN 10 01/02/2016  1156   CREATININE 0.90 01/02/2016 1156   CALCIUM 9.2 01/02/2016 1152   PROT 7.0 01/02/2016 1152   ALBUMIN 3.8 01/02/2016 1152   AST 27 01/02/2016 1152   ALT 30 01/02/2016 1152   ALKPHOS 63 01/02/2016 1152   BILITOT 0.8 01/02/2016 1152   GFRNONAA >60 01/02/2016 1152   GFRAA >60 01/02/2016 1152   COAGS Lab Results  Component Value Date   INR 0.98 01/02/2016   Lipid Panel    Component Value Date/Time   CHOL 136 01/03/2016 0753   TRIG 163 (H) 01/03/2016 0753   HDL 35 (L) 01/03/2016 0753   CHOLHDL 3.9 01/03/2016 0753   VLDL 33 01/03/2016 0753   LDLCALC 68 01/03/2016 0753   HgbA1C  Lab Results  Component Value Date   HGBA1C 6.7 (H) 01/03/2016   Cardiac Panel (last 3 results) No results for  input(s): CKTOTAL, CKMB, TROPONINI, RELINDX in the last 72 hours. Urinalysis    Component Value Date/Time   COLORURINE YELLOW 01/02/2016 1950   APPEARANCEUR CLEAR 01/02/2016 1950   LABSPEC 1.046 (H) 01/02/2016 1950   PHURINE 6.0 01/02/2016 1950   GLUCOSEU NEGATIVE 01/02/2016 1950   HGBUR TRACE (A) 01/02/2016 1950   BILIRUBINUR NEGATIVE 01/02/2016 1950   KETONESUR 15 (A) 01/02/2016 1950   PROTEINUR NEGATIVE 01/02/2016 1950   UROBILINOGEN 0.2 07/02/2012 1911   NITRITE NEGATIVE 01/02/2016 1950   LEUKOCYTESUR NEGATIVE 01/02/2016 1950   Urine Drug Screen     Component Value Date/Time   LABOPIA NONE DETECTED 01/02/2016 1949   COCAINSCRNUR NONE DETECTED 01/02/2016 1949   LABBENZ NONE DETECTED 01/02/2016 1949   AMPHETMU NONE DETECTED 01/02/2016 1949   THCU NONE DETECTED 01/02/2016 1949   LABBARB NONE DETECTED 01/02/2016 1949    Alcohol Level No results found for: [place] STUDIES   CT HEAD  01/02/2016 Possible early infarct left subinsular/ operculum region.   CTA HEAD  01/02/2016 No significant stenosis left carotid terminus or M1 segment of the left middle cerebral artery. 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 Fetal origin of posterior cerebral arteries. Right vertebral artery ends in a posterior inferior cerebellar artery distribution.   CTA NECK  01/02/2016 4 vessel arch with left vertebral artery arising directly from the arch. No significant stenosis of either carotid bifurcation.  Dg Chest 2 View 01/02/2016 No active cardiopulmonary disease.   Mr Brain Wo Contrast 01/02/2016 Present exam was performed in an abbreviated fashion per request. Small to slightly moderate-size acute left parietal lobe infarct. There is a region of blood breakdown products within sulcus adjacent to the acute infarct which may represent clot within left middle cerebral artery branch vessel with slow flow/no  flow in left middle cerebral artery vessels beyond this region. Mild chronic microvascular changes.   Ct Head Code Stroke W/o Cm 01/02/2016 1. Acute LEFT hemisphere affecting the insula and LEFT temporal lobe.  2. ASPECTS is 8.  3. Hyperdense LEFT MCA M2/M3 vessel in the sylvian fissure. Subtle asymmetric hyperdensity of the LEFT ICA terminus and LEFT M1 MCA could signify a more proximal thrombosis. CTA head/neck is pending.   Cerebral angio 1.distal LT MCA inferior division angular branch occlusion ,associated with a mod sized area of absent perfusion of the left parietal cortical subcortical junction.  2-D Echo  - Left ventricle: The cavity size was normal. Systolic function wasnormal. The estimated ejection fraction was in the range of 60%to 65%. Wall motion was normal; there were no  regional wallmotion abnormalities. Left ventricular diastolic functionparameters were normal.  LE venous Doppler negative for DVT   TEE 01/08/2016  Indication: CVA  Sedation:Versed 5 mg IV, Fentanyl 75 mcg IV  Findings: Please see echo section for full report.  Normal LV size with EF 60-65%.  Normal RV size and systolic function. Normal atrial sizes, no thrombus visualized.  There appeared to be a very small PFO, bubble study done twice to visualize a very small amount of bubble crossing. No significant tricuspid or mitral regurgitation.  Trileaflet aortic valve with no stenosis or regurgitation.  Normal caliber aorta with minimal plaque.   Impression: No definite source of embolus though she likely has a very small PFO     HISTORY OF PRESENT ILLNESS Caroline [name] is a 60 y.o. female with a history of diabetes, hypertension who presents with aphasia that started this morning. Her husband saw her around 7 AM, then when a coworker came by her house a little while later she was found to be aphasic. A code stroke was called and I met her at the bridge. She was brought into the emergency room  after in the IV TPA window had elapsed, and due to her mild symptoms was not initially considered for intra-arterial therapy. CT angiogram however did show what was initially thought to be a fairly large occlusion and given her mild symptoms and was thought she might have some tissue still at risk. She had some waxing and waning. An MRI was performed which showed a relatively small infarct. She was taken for angiogram which demonstrated a lesion that was not amenable to mechanical thrombectomy and therefore no further action was taken.   HOSPITAL COURSE Caroline [name] is a 60 y.o. female with history of diabetes and hypertension presenting with aphasia. She did not receive IV t-PA due to late presentation.  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  Resultant  mild expressive aphasia  MRI - Small to slightly moderate-size acute left parietal lobe infarct  CTA Head and neck - Occluded left M3.   2D Echo - EF 60-65%. No source of embolus   LE Venous Dopplers - negative for DVT  LDL - 68  HgbA1c 6.7  VTE prophylaxis - Lovenox  Diet Carb Modified Fluid consistency: Thin; Room service appropriate? Yes  aspirin 81 mg daily prior to admission, switched to clopidogrel 75 mg daily for stroke prevention.  Patient counseled to be compliant with her antithrombotic medications  Ongoing aggressive stroke risk factor management  Therapy recommendations: Outpatient PT recommended, intermittent supervision. no OT. OP ST (ordered)  Disposition:  Anticipate discharge home following TEE  Negative for clot. Small PFO  Blood Pressure  normal              Hyperlipidemia  Home meds:  No lipid lowering medications prior to admission  LDL 68, at goal < 70  Added Lipitor 10 for stroke prevention  Diabetes type II without complication  HgbA1c 6.7, goal < 7.0  Controlled  Other Stroke Risk Factors  Advanced age  Obesity, Body mass index is 30.39 kg/m.,  recommend weight loss, diet and exercise as appropriate   Other Active Problems  Gastritis, diarrhea 2 days before the current stroke  TEE 01/08/2016  Indication: CVA Impression: No definite source of embolus though she likely has a very small PFO   Successful insertion of a [employer] implantable loop recorder in a patient with cryptogenic stroke. 01/08/2016 Gregg Taylor, M.D.    Following these procedures the patient was discharged home in stable and improved condition.   DISCHARGE EXAM Blood pressure 102/68, pulse 76, temperature 98.3 F (36.8 C), temperature source Oral, resp. rate 16, height 5' 7" (1.702 m), weight 88 kg (194 lb 0.1 oz), SpO2 98 %. Constitutional: Appears well-developed and well-nourished.  Psych: Affect appropriate to situation Eyes: No scleral injection HENT: No OP obstrucion Head: Normocephalic.  Cardiovascular: Normal rate and regular rhythm.  Respiratory: Effort normal and breath sounds normal to anterior ascultation GI: Soft. No distension. There is no tenderness.  Skin: WDI  Neuro: Mental Status: Patient is awake, alert, oriented to person, place, month, year, and situation. Patient is able to give a clear and coherent history. No signs of neglect. She has a mild expressive aphasia, with intermittent hesitancy, naming and  and word finding difficulties Cranial Nerves: II: Visual Fields are full. Pupils are equal, round, and reactive to light.  III,IV, VI: EOMI without ptosis or diploplia.  V: Facial sensation is symmetric to temperature VII: Facial movement is notable for very rough mild right facial droop VIII: hearing is intact to voice X: Uvula elevates symmetrically XI: Shoulder shrug is symmetric. XII: tongue is midline without atrophy or fasciculations.  Motor: Tone is normal. Bulk is normal. 5/5 strength was present in all four extremities.  Sensory: Sensation is symmetric to light touch and temperature in the arms and  legs. Cerebellar: FNF and HKS are intact bilaterally  Discharge Diet   Diet Carb Modified Fluid consistency: Thin; Room service appropriate? Yes liquids  DISCHARGE PLAN  Disposition:  Discharged to home  clopidogrel 75 mg daily for secondary stroke prevention.  Ongoing risk factor control by Primary Care Physician at time of discharge  Follow-up [facility] in 2 weeks.  Follow-up with Dr. Antony Contras, Stroke Clinic in 2 months, office to schedule an appointment.  Outpatient follow-up with cardiology as instructed  Outpatient physical and occupational therapies scheduled.  30 minutes were spent preparing discharge.  Mikey Bussing PA-C [facility] Pager [phone] 01/08/2016, 5:26 PM I have personally examined this patient, reviewed notes, independently viewed imaging studies, participated in medical decision making and plan of care.ROS completed by me personally and pertinent positives fully documented  I have made any additions or clarifications directly to the above note. Agree with note above.   Antony Contras, MD Medical Director [facility] Pager: [phone] 01/08/2016 9:19 PM

## 2016-01-08 NOTE — Progress Notes (Signed)
Physical Therapy Cancellation Note   01/08/16 0938  PT Visit Information  Last PT Received On 01/08/16  Reason Eval/Treat Not Completed Patient at procedure or test/unavailable  History of Present Illness Patient is a 60 y/o female with hx of DM and HTN who presents with aphasia. Head CT-Acute LEFT hemisphere affecting the insula and LEFT temporal lobe. MRI- + Small to slightly moderate-size acute left parietal lobe infarct. CT angiogram-Occluded left middle cerebral artery M2 branch   Erline LevineKellyn Yvonnie Schinke, PTA Pager: [phone]

## 2016-01-08 NOTE — H&P (View-Only) (Signed)
STROKE TEAM PROGRESS NOTE   SUBJECTIVE (INTERVAL HISTORY) Family member (? Mother) is at the bedside. Patient reports still having difficulty with remembering some words. Confirmed with her that TEE is still planned for 0900 tomorrow. No change in condition over night. Stable. Dr. Pearlean Brownie discussed/educated loop recorder and plans for monitoring with her.   OBJECTIVE Temp:  [97.7 F (36.5 C)-98.5 F (36.9 C)] 98.3 F (36.8 C) (09/19 0944) Pulse Rate:  [52-65] 65 (09/19 0944) Cardiac Rhythm: Normal sinus rhythm (09/18 1900) Resp:  [18-20] 20 (09/19 0944) BP: (101-121)/(69-77) 101/71 (09/19 0944) SpO2:  [97 %-100 %] 98 % (09/19 0944)  CBC:   Recent Labs Lab 01/02/16 1152 01/02/16 1156  WBC 7.2  --   NEUTROABS 4.6  --   HGB 15.1* 16.3*  HCT 47.9* 48.0*  MCV 97.4  --   PLT 169  --     Basic Metabolic Panel:   Recent Labs Lab 01/02/16 1152 01/02/16 1156  NA 138 138  K 3.8 4.0  CL 105 103  CO2 22  --   GLUCOSE 158* 154*  BUN 8 10  CREATININE 0.89 0.90  CALCIUM 9.2  --     Lipid Panel:     Component Value Date/Time   CHOL 136 01/03/2016 0753   TRIG 163 (H) 01/03/2016 0753   HDL 35 (L) 01/03/2016 0753   CHOLHDL 3.9 01/03/2016 0753   VLDL 33 01/03/2016 0753   LDLCALC 68 01/03/2016 0753   HgbA1c:  Lab Results  Component Value Date   HGBA1C 6.7 (H) 01/03/2016   Urine Drug Screen:     Component Value Date/Time   LABOPIA NONE DETECTED 01/02/2016 1949   COCAINSCRNUR NONE DETECTED 01/02/2016 1949   LABBENZ NONE DETECTED 01/02/2016 1949   AMPHETMU NONE DETECTED 01/02/2016 1949   THCU NONE DETECTED 01/02/2016 1949   LABBARB NONE DETECTED 01/02/2016 1949      IMAGING  TEE pending    PHYSICAL EXAM Constitutional: Appears well-developed and well-nourished.  Psych: Affect appropriate to situation Eyes: No scleral injection HENT: No OP obstrucion Head: Normocephalic.  Cardiovascular: Normal rate and regular rhythm.  Respiratory: Effort normal and breath  sounds normal to anterior ascultation GI: Soft.  No distension. There is no tenderness.  Skin: WDI  Neuro: Mental Status: Patient is awake, alert, oriented to person, place, month, year, and situation. Patient is able to give a clear and coherent history. No signs of neglect. She has a mild expressive aphasia, with intermittent hesitancy, naming and  and word finding difficulties Cranial Nerves: II: Visual Fields are full. Pupils are equal, round, and reactive to light.   III,IV, VI: EOMI without ptosis or diploplia.  V: Facial sensation is symmetric to temperature VII: Facial movement is notable for very rough mild right facial droop VIII: hearing is intact to voice X: Uvula elevates symmetrically XI: Shoulder shrug is symmetric. XII: tongue is midline without atrophy or fasciculations.  Motor: Tone is normal. Bulk is normal. 5/5 strength was present in all four extremities.  Sensory: Sensation is symmetric to light touch and temperature in the arms and legs. Cerebellar: FNF and HKS are intact bilaterally   ASSESSMENT/PLAN [name] is a 60 y.o. female with history of diabetes and hypertension presenting with aphasia. She did not receive IV t-PA due to late presentation.  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  Resultant  mild expressive aphasia  MRI - Small to slightly moderate-size acute left parietal lobe infarct  CTA Head and neck - Occluded left M3.   2D Echo - EF 60-65%. No source of embolus   LE Venous Dopplers - negative for DVT  TEE scheduled as an IP on Wednedsay at 9a. .   Loop recorder to rule out A. fib if above findings negative.  LDL - 68  HgbA1c 6.7  VTE prophylaxis - Lovenox Diet Carb Modified Fluid consistency: Thin; Room service appropriate? Yes Diet NPO time specified Except for: Sips with Meds  aspirin 81 mg daily prior to admission, switched to clopidogrel 75 mg daily for stroke prevention.  Patient  counseled to be compliant with her antithrombotic medications  Ongoing aggressive stroke risk factor management  Therapy recommendations: Outpatient PT recommended, intermittent supervision. no OT. OP ST (ordered)  Disposition:  Anticipate discharge home following TEE on Wed  Blood Pressure  normal   Hyperlipidemia  Home meds:  No lipid lowering medications prior to admission  LDL 68, at goal < 70  Added Lipitor 10 for stroke prevention  Diabetes type II without complication  HgbA1c 6.7, goal < 7.0  Controlled  Other Stroke Risk Factors  Advanced age  Obesity, Body mass index is 30.39 kg/m., recommend weight loss, diet and exercise as appropriate   Other Active Problems  Gastritis, diarrhea 2 days before the current stroke  Hospital day # 5  Caroline [name],Caroline [name]   Stroke Center See Amion for Pager information 01/07/2016 10:25 AM  I have personally examined this patient, reviewed notes, independently viewed imaging studies, participated in medical decision making and plan of care.ROS completed by me personally and pertinent positives fully documented  I have made any additions or clarifications directly to the above note. Agree with note above. Greater than 50% time during this 25 minute visit was spent on counseling and coordination of care about stroke risk, TEE, loop recorder testing and answering questions  Delia HeadyPramod Zavion Sleight, MD Medical Director [facility] Pager: [phone] 01/07/2016 4:42 PM  To contact Stroke Continuity provider, please refer to [email or web address]. After hours, contact General Neurology

## 2016-01-08 NOTE — Progress Notes (Signed)
STROKE TEAM PROGRESS NOTE   SUBJECTIVE (INTERVAL HISTORY) Family member (? Mother) is at the bedside. Patient reports still having difficulty with remembering some words. TEE shows no cardiac source of embolism. Possible small PFoObut had LE venous dopplers earlier which were negative for DVT  OBJECTIVE Temp:  [97.9 F (36.6 C)-98.4 F (36.9 C)] 98.3 F (36.8 C) (09/20 1410) Pulse Rate:  [58-91] 76 (09/20 1410) Cardiac Rhythm: Normal sinus rhythm (09/19 1900) Resp:  [10-30] 16 (09/20 1410) BP: (102-165)/(68-105) 102/68 (09/20 1410) SpO2:  [95 %-100 %] 98 % (09/20 1410)  CBC:   Recent Labs Lab 01/02/16 1152 01/02/16 1156  WBC 7.2  --   NEUTROABS 4.6  --   HGB 15.1* 16.3*  HCT 47.9* 48.0*  MCV 97.4  --   PLT 169  --     Basic Metabolic Panel:   Recent Labs Lab 01/02/16 1152 01/02/16 1156  NA 138 138  K 3.8 4.0  CL 105 103  CO2 22  --   GLUCOSE 158* 154*  BUN 8 10  CREATININE 0.89 0.90  CALCIUM 9.2  --     Lipid Panel:     Component Value Date/Time   CHOL 136 01/03/2016 0753   TRIG 163 (H) 01/03/2016 0753   HDL 35 (L) 01/03/2016 0753   CHOLHDL 3.9 01/03/2016 0753   VLDL 33 01/03/2016 0753   LDLCALC 68 01/03/2016 0753   HgbA1c:  Lab Results  Component Value Date   HGBA1C 6.7 (H) 01/03/2016   Urine Drug Screen:     Component Value Date/Time   LABOPIA NONE DETECTED 01/02/2016 1949   COCAINSCRNUR NONE DETECTED 01/02/2016 1949   LABBENZ NONE DETECTED 01/02/2016 1949   AMPHETMU NONE DETECTED 01/02/2016 1949   THCU NONE DETECTED 01/02/2016 1949   LABBARB NONE DETECTED 01/02/2016 1949      IMAGING  TEE pending    PHYSICAL EXAM Constitutional: Appears well-developed and well-nourished.  Psych: Affect appropriate to situation Eyes: No scleral injection HENT: No OP obstrucion Head: Normocephalic.  Cardiovascular: Normal rate and regular rhythm.  Respiratory: Effort normal and breath sounds normal to anterior ascultation GI: Soft.  No  distension. There is no tenderness.  Skin: WDI  Neuro: Mental Status: Patient is awake, alert, oriented to person, place, month, year, and situation. Patient is able to give a clear and coherent history. No signs of neglect. She has a mild expressive aphasia, with intermittent hesitancy, naming and  and word finding difficulties Cranial Nerves: II: Visual Fields are full. Pupils are equal, round, and reactive to light.   III,IV, VI: EOMI without ptosis or diploplia.  V: Facial sensation is symmetric to temperature VII: Facial movement is notable for very rough mild right facial droop VIII: hearing is intact to voice X: Uvula elevates symmetrically XI: Shoulder shrug is symmetric. XII: tongue is midline without atrophy or fasciculations.  Motor: Tone is normal. Bulk is normal. 5/5 strength was present in all four extremities.  Sensory: Sensation is symmetric to light touch and temperature in the arms and legs. Cerebellar: FNF and HKS are intact bilaterally   ASSESSMENT/PLAN Ms. Zenovia JarredDonna F Bynum is a 60 y.o. female with history of diabetes and hypertension presenting with aphasia. She did not receive IV t-PA due to late presentation.  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  Resultant  mild expressive aphasia  MRI - Small to slightly moderate-size acute left parietal lobe infarct  CTA Head and neck - Occluded left M3.  2D Echo - EF 60-65%. No source of embolus   LE Venous Dopplers - negative for DVT  TEE scheduled as an IP on Wednedsay at 9a. .   Loop recorder to rule out A. fib if above findings negative.  LDL - 68  HgbA1c 6.7  VTE prophylaxis - Lovenox Diet Carb Modified Fluid consistency: Thin; Room service appropriate? Yes  aspirin 81 mg daily prior to admission, switched to clopidogrel 75 mg daily for stroke prevention.  Patient counseled to be compliant with her antithrombotic medications  Ongoing aggressive stroke risk factor  management  Therapy recommendations: Outpatient PT recommended, intermittent supervision. no OT. OP ST (ordered)  Disposition:  Anticipate discharge home following TEE  Negative for clot. Small PFO  Blood Pressure  normal   Hyperlipidemia  Home meds:  No lipid lowering medications prior to admission  LDL 68, at goal < 70  Added Lipitor 10 for stroke prevention  Diabetes type II without complication  HgbA1c 6.7, goal < 7.0  Controlled  Other Stroke Risk Factors  Advanced age  Obesity, Body mass index is 30.39 kg/m., recommend weight loss, diet and exercise as appropriate   Other Active Problems  Gastritis, diarrhea 2 days before the current stroke  Hospital day # 6    I have personally examined this patient, reviewed notes, independently viewed imaging studies, participated in medical decision making and plan of care.ROS completed by me personally and pertinent positives fully documented  I have made any additions or clarifications directly to the above note. Dc home after loop recorder. F/U as outpatient in 4-6 weeks   Delia Heady, MD Medical Director [facility] Pager: [phone] 01/08/2016 3:28 PM  To contact Stroke Continuity provider, please refer to [email or web address]. After hours, contact General Neurology

## 2016-01-08 NOTE — Progress Notes (Signed)
Pt d/c to home. Discharge paperwork, reasons to return to ED/MD, follow up appts, and prescriptions reviewed with pt and daughter at bedside. Pt and family member offered no questions and confirmed learning with teachback method. PIV removed without complication. Pt escorted off of unit to main lobby via wheelchair by PCT Maisie Fushomas.

## 2016-01-08 NOTE — Progress Notes (Signed)
*  PRELIMINARY RESULTS* Echocardiogram Echocardiogram Transesophageal has been performed.  Caroline [name], Caroline [name] 01/08/2016, 9:56 AM

## 2016-01-08 NOTE — CV Procedure (Signed)
Procedure:TEE  Indication: CVA  Sedation:Versed 5 mg IV, Fentanyl 75 mcg IV  Findings: Please see echo section for full report.  Normal LV size with EF 60-65%.  Normal RV size and systolic function. Normal atrial sizes, no thrombus visualized.  There appeared to be a very small PFO, bubble study done twice to visualize a very small amount of bubble crossing. No significant tricuspid or mitral regurgitation.  Trileaflet aortic valve with no stenosis or regurgitation.  Normal caliber aorta with minimal plaque.   Impression: No definite source of embolus though she likely has a very small PFO.    Caroline AnconaDalton [name] Caroline [name] 01/08/2016 9:32 AM

## 2016-01-16 ENCOUNTER — Telehealth: Payer: Self-pay | Admitting: *Deleted

## 2016-01-16 ENCOUNTER — Ambulatory Visit: Payer: BLUE CROSS/BLUE SHIELD | Attending: Neurology | Admitting: Speech Pathology

## 2016-01-16 DIAGNOSIS — R4701 Aphasia: Secondary | ICD-10-CM | POA: Diagnosis not present

## 2016-01-16 DIAGNOSIS — R2681 Unsteadiness on feet: Secondary | ICD-10-CM | POA: Diagnosis present

## 2016-01-16 DIAGNOSIS — R41841 Cognitive communication deficit: Secondary | ICD-10-CM | POA: Insufficient documentation

## 2016-01-16 NOTE — Therapy (Signed)
[facility] [address] [place], [place], [address] Phone: [phone]   Fax:  [phone]  Speech Language Pathology Evaluation  Patient Details  Name: Zenovia JarredDonna F Pitney MRN: [id number] Date of Birth: [birth year] Referring Provider: Dr. Pearlean BrownieSethi  Encounter Date: 01/16/2016      End of Session - 01/16/16 1517    Visit Number 1   Number of Visits 17   Date for SLP Re-Evaluation 03/12/16   SLP Start Time 1400   SLP Stop Time  1450   SLP Time Calculation (min) 50 min   Activity Tolerance Patient tolerated treatment well      Past Medical History:  Diagnosis Date  . Diabetes mellitus without complication (HCC)   . Reflux     Past Surgical History:  Procedure Laterality Date  . ABDOMINAL HYSTERECTOMY    . APPENDECTOMY    . EP IMPLANTABLE DEVICE N/A 01/08/2016   Procedure: Loop Recorder Insertion;  Surgeon: Marinus MawGregg W Taylor, MD;  Location: [facility] INVASIVE CV LAB;  Service: Cardiovascular;  Laterality: N/A;  . IR GENERIC HISTORICAL  01/02/2016   IR ANGIO VERTEBRAL SEL VERTEBRAL UNI L MOD SED 01/02/2016 Julieanne CottonSanjeev Deveshwar, MD [facility]-INTER[facility] RAD  . IR GENERIC HISTORICAL  01/02/2016   IR ANGIO INTRA EXTRACRAN SEL COM CAROTID INNOMINATE BILAT MOD SED 01/02/2016 Julieanne CottonSanjeev Deveshwar, MD [facility]  . IR GENERIC HISTORICAL  01/02/2016   IR ANGIO VERTEBRAL SEL SUBCLAVIAN INNOMINATE UNI R MOD SED 01/02/2016 Julieanne CottonSanjeev Deveshwar, MD [facility]  . RADIOLOGY WITH ANESTHESIA N/A 01/02/2016   Procedure: RADIOLOGY WITH ANESTHESIA;  Surgeon: Medication Radiologist, MD;  Location: [facility] OR;  Service: Radiology;  Laterality: N/A;  . TEE WITHOUT CARDIOVERSION N/A 01/08/2016   Procedure: TRANSESOPHAGEAL ECHOCARDIOGRAM (TEE);  Surgeon: Laurey Moralealton S McLean, MD;  [facility];  Service: Cardiovascular;  Laterality: N/A;    There were no vitals filed for this visit.      Subjective Assessment - 01/16/16 1408    Subjective "I just can't think of the words  and when I read I don't understand"   Patient is accompained by: Family member   Special Tests daughter   Currently in Pain? No/denies            SLP Evaluation [facility] - 01/16/16 1408      SLP Visit Information   SLP Received On 01/16/16   Referring Provider Dr. Pearlean BrownieSethi   Onset Date 01/02/16   Medical Diagnosis CVA     Subjective   Subjective "I can't get words out"   Patient/Family Stated Goal To return to work     General Information   HPI 60 y.o. female with a history of diabetes, hypertension who presents with aphasia that started 01/02/16 MRI small to slightly moderate-size acute left parietal lobe infarct. 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   Mobility Status walks independenly     Prior Functional Status   Cognitive/Linguistic Baseline Within functional limits   Type of Home House    Lives With Spouse   Available Support Family   Vocation Full time employment     Pain Assessment   Pain Assessment 0-10   Pain Score 6    Pain Location head ache   Pain Descriptors / Indicators Discomfort;Aching   Pain Intervention(s) Monitored during session     Cognition   Overall Cognitive Status Impaired/Different from baseline   Area of Impairment Attention;Memory;Problem solving   Attention Alternating;Divided   Alternating Attention Impaired   Alternating Attention Impairment  Verbal complex;Functional complex   Divided Attention Impaired   Divided Attention Impairment Verbal basic;Functional basic   Awareness Appears intact   Problem Solving Impaired   Problem Solving Impairment Verbal complex;Functional complex  slow processing     Auditory Comprehension   Overall Auditory Comprehension Impaired   Yes/No Questions Impaired   Basic Immediate Environment Questions 75-100% accurate   Complex Questions 75-100% accurate   Multistep Basic Commands 75-100% accurate   Conversation Simple   Interfering Components Attention   EffectiveTechniques Repetition;Extra  processing time     Reading Comprehension   Reading Status Impaired   Paragraph Level 51-75% accurate     Expression   Primary Mode of Expression Verbal     Verbal Expression   Overall Verbal Expression Impaired   Initiation No impairment   Level of Generative/Spontaneous Verbalization Conversation;Sentence   Repetition No impairment   Naming Impairment   Responsive 76-100% accurate   Confrontation 75-100% accurate   Convergent 75-100% accurate   Divergent 75-100% accurate   Verbal Errors Aware of errors   Pragmatics No impairment     Written Expression   Dominant Hand Left   Written Expression Within Functional Limits     Oral Motor/Sensory Function   Overall Oral Motor/Sensory Function Appears within functional limits for tasks assessed     Motor Speech   Overall Motor Speech Appears within functional limits for tasks assessed     Standardized Assessments   Standardized Assessments  [place] Naming Test-2nd edition                         SLP Education - 01/16/16 1516    Education provided Yes   Education Details areas of impairment, goalsl for therapy, compensatory strategies for attention and aphasia,    Person(s) Educated Patient;Child(ren)   Methods Explanation;Demonstration;Handout   Comprehension Verbalized understanding;Returned demonstration;Need further instruction          SLP Short Term Goals - 01/16/16 1529      SLP SHORT TERM GOAL #1   Title Pt will complete complex naming tasks with 90% accuracy and rare min A   Time 4   Period Weeks   Status New     SLP SHORT TERM GOAL #2   Title Pt will alternate attention between 2 mildly complex cognitive linguistic tasks with 85% on each and occasional min A   Time 4   Period Weeks   Status New     SLP SHORT TERM GOAL #3   Title Pt will perform attention to detail and abstract reasoning tasks with rare min A   Time 8   Period Weeks   Status New          SLP Long Term Goals -  01/16/16 1530      SLP LONG TERM GOAL #1   Title Pt will utilize compensations for aphasia over 15 minute complex conversation with no requests for clarificiation over 2 sessions   Time 8   Period Weeks   Status New     SLP LONG TERM GOAL #2   Title Pt will divide attention on 2 cognitive linguistic tasks with 85% accuracy on each and rare min A   Time 8   Period Weeks   Status New          Plan - 01/16/16 1521    Clinical Impression Statement Mrs. Devora, A 60 y.o. suffered a CVA on 01/02/16. She was hospitalized 01/02/16 to 01/08/16. She received  ST on acute care for word finding and cognition. Today she presents with mild to moderate aphasia and moderate attention /cognitive impairments. Mrs. Weitman followed multistep commands and answered compens yes/no questions with slow processing and 80% accuracy. Repetition and automatic speech are intact. She scored a 50/60 on the [employer], with 53/60 being [facility] for her age. Again, slow processing with delayed responses on [employer]. In conversation, word finidng impairment is noted on lower frequency words. Speech is fluent . Pt demonstrated reduced attention to detail and reduced alternating/divided attention on check writing/balancing task. She solved basic time and money problems with extended time for processing. Mrs. Demelo reports head aches when she has to concentrate for extended periods or when she is in a high stimulation environement.  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 At this time, I feel her attention deficits would affect her ability to do this job accurately and fatigue would exacerbate her attention impairments. I recommend skilled ST to maximize verbal expression and cognition for possible return to work.   Speech Therapy Frequency 2x / week   Duration --  8 weeks or total of 17 visits   Treatment/Interventions Compensatory strategies;Functional tasks;Patient/family  education;Cueing hierarchy;Cognitive reorganization;Multimodal communcation approach;Internal/external aids;SLP instruction and feedback;Environmental controls      Patient will benefit from skilled therapeutic intervention in order to improve the following deficits and impairments:   Aphasia  Cognitive communication deficit    Problem List Patient Active Problem List   Diagnosis Date Noted  . 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 Stroke (cerebrum) (HCC) 01/02/2016    Kissa Campoy, Radene Journey MS, CCC-SLP 01/16/2016, 3:33 PM  [facility] [address] [place], [place], [address] Phone: [phone]   Fax:  [phone]  Name: NASHALIE SALLIS MRN: [id number] Date of Birth: April 01, 1956

## 2016-01-16 NOTE — Patient Instructions (Signed)
  Cognitive Activities you can do at home:   - Solitaire  - Majong  - Scrabble  - Chess/Checkers  - Crosswords (easy level)  - Juanna CaoUno  - Card Games  - Board Games  - Connect 4  - Simon  - the Memory Game  - Dominoes  - jig saw  On your computer, tablet or phone:  Sort it out NiSourceScrabble pics App Photo Quiz App MixTwo App What's the Word?App  Try to extend your concentration time on puzzle/games a little more everyday  Tips for Talking with People who have Aphasia  . Say one thing at a time . Don't  rush - slow down, be patient . Reduce background noise . Relax - be natural . Use pen and paper . Write down key words . Draw diagrams or pictures . Don't pretend you understand . Ask what helps . Recap - check you both understand   Describing words  What group does it belong to?  What do I use it for?  Where can I find it?  What does it LOOK like?  What other words go with it?  What is the 1st sound of the word?  Many Ways to Communicate  Describe it Write it Draw it Gesture it Use related words  There's an App for that: Family Feud, Heads up, Stop-fun categories, What if, Junie BameConversation TherAPPy  Provided by: Rolin BarryLaura L. ST, [phone]

## 2016-01-17 ENCOUNTER — Ambulatory Visit: Payer: BLUE CROSS/BLUE SHIELD | Admitting: Rehabilitation

## 2016-01-17 ENCOUNTER — Encounter: Payer: Self-pay | Admitting: Rehabilitation

## 2016-01-17 DIAGNOSIS — R4701 Aphasia: Secondary | ICD-10-CM | POA: Diagnosis not present

## 2016-01-17 DIAGNOSIS — R2681 Unsteadiness on feet: Secondary | ICD-10-CM

## 2016-01-17 NOTE — Patient Instructions (Signed)
Feet Together (Compliant Surface) Arm Motion - Eyes Closed    Stand on compliant surface: ___pillow or cushion_____ with feet together. Close eyes and keep arms by your side.  Repeat _3___ times per session for 30 secs each. Do __1-2__ sessions per day.  Copyright  VHI. All rights reserved.   Feet Together (Compliant Surface) Head Motion - Eyes Closed    Stand on compliant surface: __pillow or cushion ______ with feet together. Close eyes and move head slowly, up and down x 10 reps, side to side x 10 reps and diagonally x 10 reps each (make sure you do both directions).  Move head only.  Repeat __1__ times per session. Do _1-2__ sessions per day.  Copyright  VHI. All rights reserved.   SINGLE LIMB STANCE    Stance: single leg on floor. Raise leg. Hold _15__ seconds. Repeat with other leg. _3__ reps per set, _2__ sets per day, _5-7__ days per week  Copyright  VHI. All rights reserved.

## 2016-01-17 NOTE — Therapy (Signed)
[facility] [address] [place], [place], [address] Phone: [phone]   Fax:  [phone]  Physical Therapy Evaluation  Patient Details  Name: Caroline [name] MRN: [id number] Date of Birth: 10/08/1955 No Data Recorded  Encounter Date: 01/17/2016      PT End of Session - 01/17/16 0855    Visit Number 1   Number of Visits 1  eval only   PT Start Time 0801   PT Stop Time 0840   PT Time Calculation (min) 39 min   Activity Tolerance Patient tolerated treatment well   Behavior During Therapy [facility] for tasks assessed/performed      Past Medical History:  Diagnosis Date  . Diabetes mellitus without complication (HCC)   . Reflux     Past Surgical History:  Procedure Laterality Date  . ABDOMINAL HYSTERECTOMY    . APPENDECTOMY    . EP IMPLANTABLE DEVICE N/A 01/08/2016   Procedure: Loop Recorder Insertion;  Surgeon: Marinus Maw, MD;  [facility] IN[facility];  Service: Cardiovascular;  Laterality: N/A;  . IR GENERIC HISTORICAL  01/02/2016   IR ANGIO VERTEBRAL SEL VERTEBRAL UNI L MOD SED 01/02/2016 Julieanne Cotton, MD [facility]-INTERV RAD  . IR GENERIC HISTORICAL  01/02/2016   IR ANGIO INTRA EXTRACRAN SEL COM CAROTID INNOMINATE BILAT MOD SED 01/02/2016 Julieanne Cotton, MD [facility]  . IR GENERIC HISTORICAL  01/02/2016   IR ANGIO VERTEBRAL SEL SUBCLAVIAN INNOMINATE UNI R MOD SED 01/02/2016 Julieanne Cotton, MD [facility]  . RADIOLOGY WITH ANESTHESIA N/A 01/02/2016   Procedure: RADIOLOGY WITH ANESTHESIA;  Surgeon: Medication Radiologist, MD;  Location: [facility] OR;  Service: Radiology;  Laterality: N/A;  . TEE WITHOUT CARDIOVERSION N/A 01/08/2016   Procedure: TRANSESOPHAGEAL ECHOCARDIOGRAM (TEE);  Surgeon: Laurey Morale, MD;  Location: [facility] ENDOSCOPY;  Service: Cardiovascular;  Laterality: N/A;    There were no vitals filed for this visit.       Subjective Assessment - 01/17/16 0803    Subjective "They told me in the  hospital that I needed to come here.  I got to where I could do stairs and I just needed someone with me."    Patient is accompained by: Family member  Daughter, Raynelle Fanning   Currently in Pain? No/denies            [facility] PT Assessment - 01/17/16 0804      Assessment   Medical Diagnosis CVA   Onset Date/Surgical Date 01/02/16   Prior Therapy Acute care     Precautions   Precautions Fall     Restrictions   Weight Bearing Restrictions No     Balance Screen   Has the patient fallen in the past 6 months No   Has the patient had a decrease in activity level because of a fear of falling?  No   Is the patient reluctant to leave their home because of a fear of falling?  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   Leisure I like sports     Cognition   Overall Cognitive Status Impaired/Different from baseline  see SLP  notes     Sensation   Light Touch Appears Intact   Hot/Cold Appears Intact   Proprioception Appears Intact     Coordination   Gross Motor Movements are Fluid and Coordinated Yes   Fine Motor Movements are Fluid and Coordinated Yes     Posture/Postural Control   Posture/Postural Control No significant limitations     ROM / Strength   AROM / PROM / Strength Strength     Strength   Overall Strength Within functional limits for tasks performed     Transfers   Transfers Sit to Stand;Stand to Sit   Sit to Stand 7: Independent   Stand to Sit 7: Independent     Ambulation/Gait   Ambulation/Gait Yes   Ambulation/Gait Assistance 7: Independent   Ambulation Distance (Feet) 345 Feet   Assistive device None    Gait Pattern Within Functional Limits   Ambulation Surface Level;Unlevel;Indoor;Outdoor;Paved;Gravel;Grass   Gait velocity 3.99 ft/sec   Stairs Yes   Stairs Assistance 7: Independent   Stair Management Technique No rails;Alternating pattern   Number of Stairs 4   Height of Stairs 6     High Level Balance   High Level Balance Activites Other (comment)   High Level Balance Comments Assessed balance with vestibular challenges while standing in corner on pillows with feet apart, EC x 30 secs>addition of head turns side/side and up/down x 10 reps.  Pt demo no LOB and very steady, therefore had her place feet together, EC maintaining balance x 30 secs with mild instability noted progressing to addition of head turns side/side, up/down and diagonally x 10 reps each.   Note mild instability with improvement as she continued.  Provided these tasks for home, but did not feel that she warranted futher OP therapy.       Functional Gait  Assessment   Gait assessed  Yes   Gait Level Surface Walks 20 ft in less than 7 sec but greater than 5.5 sec, uses assistive device, slower speed, mild gait deviations, or deviates 6-10 in outside of the 12 in walkway width.   Change in Gait Speed Able to smoothly change walking speed without loss of balance or gait deviation. Deviate no more than 6 in outside of the 12 in walkway width.   Gait with Horizontal Head Turns Performs head turns smoothly with no change in gait. Deviates no more than 6 in outside 12 in walkway width   Gait with Vertical Head Turns Performs head turns with no change in gait. Deviates no more than 6 in outside 12 in walkway width.   Gait and Pivot Turn Pivot turns safely within 3 sec and stops quickly with no loss of balance.   Step Over Obstacle Is able to step over 2 stacked shoe boxes taped together (9 in total height) without changing gait speed. No evidence of imbalance.   Gait with Narrow Base of Support Is able to ambulate for 10 steps  heel to toe with no staggering.   Gait with Eyes Closed Walks 20 ft, uses assistive device, slower speed, mild gait deviations, deviates 6-10 in outside 12 in walkway width. Ambulates 20 ft in less than 9 sec but greater than 7 sec.   Ambulating Backwards Walks 20 ft, no assistive devices, good speed, no evidence for imbalance, normal gait   Steps Alternating feet, no rail.   Total Score 28  PT Education - 01/17/16 0855    Education provided Yes   Education Details evaluation findings, HEP for high level balance deficits.    Person(s) Educated Patient;Child(ren)   Methods Explanation;Demonstration;Handout   Comprehension Verbalized understanding;Returned demonstration                    Plan - 01/17/16 0855    Clinical Impression Statement Pt presents s/p L MCA CVA on 01/02/16.  Pt received acute care PT that recommended OP therapy for high level balance.  Note history of DM and social history of working full time as Airline pilotaccountant, however due to cognitive deficits is unable to return to work at this time.  Upon PT evaluation, note gait speed 3.99 ft/sec and FGA 28/30 indicative that balance WFL, however when challenging vestibular system, note mild instability, therefore provided pt with HEP to address these deficits.  Feel that pt able to work on these at home and does not warrant further PT, therefore PT to sign off at this time.     PT Frequency One time visit   Consulted and Agree with Plan of Care Patient;Family member/caregiver   Family Member Consulted daughter Amil AmenJulia      Patient will benefit from skilled therapeutic intervention in order to improve the following deficits and impairments:     Visit Diagnosis: Unsteadiness on feet - Plan: PT plan of care cert/re-cert     Problem List Patient Active Problem List   Diagnosis Date Noted  . 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

## 2016-01-20 ENCOUNTER — Ambulatory Visit (INDEPENDENT_AMBULATORY_CARE_PROVIDER_SITE_OTHER): Payer: BLUE CROSS/BLUE SHIELD | Admitting: *Deleted

## 2016-01-20 DIAGNOSIS — I639 Cerebral infarction, unspecified: Secondary | ICD-10-CM

## 2016-01-20 NOTE — Progress Notes (Signed)
Wound Linq check. Steri-strips had been previously removed. Wound well-healed, edges approximated, wound without redness or swelling. Battery status: Good. R-waves 0.5780mV. 0  symptom episodes, 0 tachy episodes, 0 pause episodes, 0 brady episodes. 0 AF episodes . Monthly summary reports and ROV with GT PRN. PT educated about wound care and remote monitoring.

## 2016-01-21 ENCOUNTER — Other Ambulatory Visit: Payer: Self-pay

## 2016-01-21 ENCOUNTER — Ambulatory Visit: Payer: BLUE CROSS/BLUE SHIELD | Attending: Neurology | Admitting: *Deleted

## 2016-01-21 DIAGNOSIS — R4701 Aphasia: Secondary | ICD-10-CM | POA: Diagnosis not present

## 2016-01-21 DIAGNOSIS — R41841 Cognitive communication deficit: Secondary | ICD-10-CM | POA: Diagnosis present

## 2016-01-21 NOTE — Patient Outreach (Signed)
[facility] Care Management  01/21/2016  Zenovia JarredDonna F Kirchman 29-Nov-1955 [id number]     EMMI-Stroke RED ON EMMI ALERT Day # 9 Date: 01/18/16 Red Alert Reason: "Lost interest in things hey used to do? Yes     Sad, hopeless, anxious or empty? Yes"    Outreach attempt #1 to patient. Spoke with patient and addressed red alert. Patient reports that she did not mean to answer that way and denies any of the above feelings/thoughts. She states she is recovering well from stroke and recent hospital stay. Patient headed to outpatient therapy. She reports that PT/OT has recommended that she file for ST disability with her employer. Patient reports she checked with PCP and they will not completed paperwork as she was told to have neurologist complete. Patient has appt with Dr. Marlis EdelsonSethi's office  In Nov. She is going to the neuro office today to see if she can have appt moved to sooner and/or if office will complete disability paperwork. patient denies any issues with meds or transportation. States she has supportive spouse. No RN CM needs or concerns voiced at this time. Advised patient will continue to get automated EMMI Stroke calls and if one of her responses is off and triggers red then she should expect a call from a nurse to check on her. She voiced understanding and was appreciative of call.     Plan: RN CM will notify [facility] administrative assistant of case closure status.   Antionette Fairyoshanda Kesha Hurrell, RN,BSN,CCM [facility] Care Management Telephonic Care Management Coordinator Direct Phone: [phone] Toll Free: [phone] Fax: [phone]

## 2016-01-21 NOTE — Therapy (Signed)
[facility] [address] [place], [place], [address] Phone: [phone]   Fax:  [phone]  Speech Language Pathology Treatment  Patient Details  Name: Caroline [name] MRN: [id number] Date of Birth: 08-05-1955 Referring Provider: Dr. Pearlean Brownie  Encounter Date: 01/21/2016      End of Session - 01/21/16 1640    Visit Number 2   Number of Visits 17   Date for SLP Re-Evaluation 03/12/16   Activity Tolerance Patient tolerated treatment well      Past Medical History:  Diagnosis Date  . Diabetes mellitus without complication (HCC)   . Reflux     Past Surgical History:  Procedure Laterality Date  . ABDOMINAL HYSTERECTOMY    . APPENDECTOMY    . EP IMPLANTABLE DEVICE N/A 01/08/2016   Procedure: Loop Recorder Insertion;  Surgeon: Marinus Maw, MD;  [facility];  Service: Cardiovascular;  Laterality: N/A;  . IR GENERIC HISTORICAL  01/02/2016   IR ANGIO VERTEBRAL SEL VERTEBRAL UNI L MOD SED 01/02/2016 Julieanne Cotton, MD [facility]  . IR GENERIC HISTORICAL  01/02/2016   IR ANGIO INTRA EXTRACRAN SEL COM CAROTID INNOMINATE BILAT MOD SED 01/02/2016 Julieanne Cotton, MD [facility]  . IR GENERIC HISTORICAL  01/02/2016   IR ANGIO VERTEBRAL SEL SUBCLAVIAN INNOMINATE UNI R MOD SED 01/02/2016 Julieanne Cotton, MD [facility]  . RADIOLOGY WITH ANESTHESIA N/A 01/02/2016   Procedure: RADIOLOGY WITH ANESTHESIA;  Surgeon: Medication Radiologist, MD;  [facility]: [facility] OR;  Service: Radiology;  Laterality: N/A;  . TEE WITHOUT CARDIOVERSION N/A 01/08/2016   Procedure: TRANSESOPHAGEAL ECHOCARDIOGRAM (TEE);  Surgeon: Laurey Morale, MD;  Location: [facility] ENDOSCOPY;  Service: Cardiovascular;  Laterality: N/A;    There were no vitals filed for this visit.      Subjective Assessment - 01/21/16 1634    Subjective "I can't comprehend a lot of information and sometimes my words won't come out the way I want them to"                ADULT SLP TREATMENT - 01/21/16 0001      General Information   Behavior/Cognition Alert;Cooperative;Pleasant mood     Treatment Provided   Treatment provided Cognitive-Linquistic     Pain Assessment   Pain Assessment 0-10   Pain Score 5    Faces Pain Scale Hurts little more   Pain Location headache   Pain Descriptors / Indicators Dull;Discomfort;Headache   Pain Intervention(s) Limited activity within patient's tolerance     Cognitive-Linquistic Treatment   Treatment focused on Aphasia   Skilled Treatment Pt decoded and participated in answering comprehension questions with short 3-4 sentence paragraphs with min-mod assistance initially and faded to min A by end of session; discussed and implemented various compensatory strategies including circumlocution, describing and using synonyms to retrieve words during conversational tasks and naming tasks during session with minimal assistance needed throughout session with phonemic and semantic cues provided; functional activities recommended at home with familiar tasks (i.e.: following recipes, reading devotions, etc.); complex naming tasks required min-mod A during session with faded cues at end of activity only     Assessment / Recommendations / Plan   Plan Continue with current plan of care; pt would benefit from continued skilled speech therapy to increase comprehension and expression of verbal communication and written information to return to prior functioning of full-time work/social activities     Progression Toward Goals   Progression toward goals Progressing toward goals  SLP Short Term Goals - 01/21/16 1641      SLP SHORT TERM GOAL #1   Title Pt will complete complex naming tasks with 90% accuracy and rare min A   Time 3   Period Weeks   Status New     SLP SHORT TERM GOAL #2   Title Pt will alternate attention between 2 mildly complex cognitive linguistic tasks with 85% on each and  occasional min A   Time 3   Period Weeks   Status New     SLP SHORT TERM GOAL #3   Title Pt will perform attention to detail and abstract reasoning tasks with rare min A   Time 7   Period Weeks   Status New          SLP Long Term Goals - 01/21/16 1642      SLP LONG TERM GOAL #1   Title Pt will utilize compensations for aphasia over 15 minute complex conversation with no requests for clarificiation over 2 sessions   Time 7   Period Weeks   Status New     SLP LONG TERM GOAL #2   Title Pt will divide attention on 2 cognitive linguistic tasks with 85% accuracy on each and rare min A   Time 7   Period Weeks   Status New        Patient will benefit from skilled therapeutic intervention in order to improve the following deficits and impairments:   Aphasia    Problem List Patient Active Problem List   Diagnosis Date Noted  . 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

## 2016-01-24 ENCOUNTER — Ambulatory Visit: Payer: BLUE CROSS/BLUE SHIELD | Admitting: *Deleted

## 2016-01-29 ENCOUNTER — Ambulatory Visit: Payer: BLUE CROSS/BLUE SHIELD | Admitting: *Deleted

## 2016-01-29 DIAGNOSIS — R4701 Aphasia: Secondary | ICD-10-CM

## 2016-01-29 DIAGNOSIS — R41841 Cognitive communication deficit: Secondary | ICD-10-CM

## 2016-01-29 NOTE — Therapy (Signed)
[facility] [address] [place], [place], [address] Phone: [phone]   Fax:  [phone]  Speech Language Pathology Treatment  Patient Details  Name: Caroline [name] MRN: [id number] Date of Birth: 04/03/1956 Referring Provider: Dr. Pearlean BrownieSethi  Encounter Date: 01/29/2016      End of Session - 01/29/16 1649    Visit Number 3   Number of Visits 17   Date for SLP Re-Evaluation 03/12/16   SLP Start Time 1445   SLP Stop Time  1530   SLP Time Calculation (min) 45 min   Activity Tolerance Patient tolerated treatment well      Past Medical History:  Diagnosis Date  . Diabetes mellitus without complication (HCC)   . Reflux     Past Surgical History:  Procedure Laterality Date  . ABDOMINAL HYSTERECTOMY    . APPENDECTOMY    . EP IMPLANTABLE DEVICE N/A 01/08/2016   Procedure: Loop Recorder Insertion;  Surgeon: Marinus MawGregg W Taylor, MD;  [facility];  Service: Cardiovascular;  Laterality: N/A;  . IR GENERIC HISTORICAL  01/02/2016   IR ANGIO VERTEBRAL SEL VERTEBRAL UNI L MOD SED 01/02/2016 Julieanne CottonSanjeev Deveshwar, MD [facility]  . IR GENERIC HISTORICAL  01/02/2016   IR ANGIO INTRA EXTRACRAN SEL COM CAROTID INNOMINATE BILAT MOD SED 01/02/2016 Julieanne CottonSanjeev Deveshwar, MD [facility]  . IR GENERIC HISTORICAL  01/02/2016   IR ANGIO VERTEBRAL SEL SUBCLAVIAN INNOMINATE UNI R MOD SED 01/02/2016 Julieanne CottonSanjeev Deveshwar, MD [facility]  . RADIOLOGY WITH ANESTHESIA N/A 01/02/2016   Procedure: RADIOLOGY WITH ANESTHESIA;  Surgeon: Medication Radiologist, MD;  [facility]: [facility] OR;  Service: Radiology;  Laterality: N/A;  . TEE WITHOUT CARDIOVERSION N/A 01/08/2016   Procedure: TRANSESOPHAGEAL ECHOCARDIOGRAM (TEE);  Surgeon: Laurey Moralealton S McLean, MD;  [facility];  Service: Cardiovascular;  Laterality: N/A;    There were no vitals filed for this visit.      Subjective Assessment - 01/29/16 1640    Subjective I get emotional sometimes    Patient is accompained by: Family member   Special Tests daughter   Currently in Pain? Yes   Pain Score 4    Pain Location Head   Pain Orientation Left;Anterior   Pain Descriptors / Indicators Headache   Pain Frequency Intermittent               ADULT SLP TREATMENT - 01/29/16 0001      General Information   Behavior/Cognition Alert;Cooperative;Pleasant mood   Patient Positioning Upright in chair   Oral care provided N/A     Treatment Provided   Treatment provided Cognitive-Linquistic     Pain Assessment   Pain Assessment 0-10   Pain Score 4    Pain Location headache   Pain Descriptors / Indicators Headache     Cognitive-Linquistic Treatment   Treatment focused on Aphasia   Skilled Treatment Pt returned homework given last session, having completed "following written directions". She had difficulty completing worksheet for identification of synonyms. Pt was encouraged to continue working on this until next session. Pt was able to describe current activities, areas of deficit, and desire to return to full time work. Pt also indicated difficulty with recall of written information. Pt was encouraged to utilize strategies to facilitate comprehension/recall such as making lists, taking notes. Pt was encouraged to identify functional tasks at home and alternate between them, making note of how long she is able to continue to work before needing a break. This activity is designed to use  functional home activities to prepare for return to work. SLP and pt discussed various apps that she could use for problem solving (FLOW), attention to detail (hidden pictures).     Assessment / Recommendations / Plan   Plan Continue with current plan of care     Progression Toward Goals   Progression toward goals Progressing toward goals            SLP Short Term Goals - 01/29/16 1654      SLP SHORT TERM GOAL #1   Title Pt will complete complex naming tasks with 90% accuracy and rare min  A   Time 2   Period Weeks   Status On-going     SLP SHORT TERM GOAL #2   Title Pt will alternate attention between 2 mildly complex cognitive linguistic tasks with 85% on each and occasional min A   Time 2   Period Weeks   Status On-going     SLP SHORT TERM GOAL #3   Title Pt will perform attention to detail and abstract reasoning tasks with rare min A   Time 6   Period Weeks   Status On-going          SLP Long Term Goals - 01/29/16 1655      SLP LONG TERM GOAL #1   Title Pt will utilize compensations for aphasia over 15 minute complex conversation with no requests for clarificiation over 2 sessions   Time 6   Period Weeks   Status On-going     SLP LONG TERM GOAL #2   Title Pt will divide attention on 2 cognitive linguistic tasks with 85% accuracy on each and rare min A   Time 6   Period Weeks   Status On-going          Plan - 01/29/16 1652    Clinical Impression Statement Pt is aware of com/cog deficits, including high level word retrieval, high level attention and attention to detail, and abstract reasoning. 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 Continued ST intervention is recommended to maximize com/cog improvement.   Speech Therapy Frequency 2x / week   Duration --  8 weeks or 17 visits   Treatment/Interventions Compensatory strategies;Functional tasks;Patient/family education;Cueing hierarchy;Cognitive reorganization;Multimodal communcation approach;Internal/external aids;SLP instruction and feedback;Environmental controls   Potential to Achieve Goals Good   Consulted and Agree with Plan of Care Patient      Patient will benefit from skilled therapeutic intervention in order to improve the following deficits and impairments:   Cognitive communication deficit  Aphasia    Problem List Patient Active Problem List   Diagnosis Date Noted  . 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 Sibley, MSP, CCC-SLP Leigh Aurora 01/29/2016, 4:57 PM  [facility] [address] [place], [place], [address] Phone: [phone]   Fax:  [phone]   Name: Caroline [name] MRN: [id number] Date of Birth: 1956/02/19

## 2016-02-03 ENCOUNTER — Ambulatory Visit: Payer: BLUE CROSS/BLUE SHIELD | Admitting: *Deleted

## 2016-02-03 DIAGNOSIS — R4701 Aphasia: Secondary | ICD-10-CM | POA: Diagnosis not present

## 2016-02-03 DIAGNOSIS — R41841 Cognitive communication deficit: Secondary | ICD-10-CM

## 2016-02-03 NOTE — Therapy (Signed)
[facility] [address] [place], [place], [address] Phone: [phone]   Fax:  [phone]  Speech Language Pathology Treatment  Patient Details  Name: Caroline [name] MRN: [id number] Date of Birth: 1956-01-09 Referring Provider: Dr. Pearlean Brownie  Encounter Date: 02/03/2016      End of Session - 02/03/16 1359    Visit Number 4   Number of Visits 17   Date for SLP Re-Evaluation 03/12/16   SLP Start Time 0800   SLP Stop Time  0845   SLP Time Calculation (min) 45 min   Activity Tolerance Patient tolerated treatment well      Past Medical History:  Diagnosis Date  . Diabetes mellitus without complication (HCC)   . Reflux     Past Surgical History:  Procedure Laterality Date  . ABDOMINAL HYSTERECTOMY    . APPENDECTOMY    . EP IMPLANTABLE DEVICE N/A 01/08/2016   Procedure: Loop Recorder Insertion;  Surgeon: Marinus Maw, MD;  [facility];  Service: Cardiovascular;  Laterality: N/A;  . IR GENERIC HISTORICAL  01/02/2016   IR ANGIO VERTEBRAL SEL VERTEBRAL UNI L MOD SED 01/02/2016 Julieanne Cotton, MD [facility]  . IR GENERIC HISTORICAL  01/02/2016   IR ANGIO INTRA EXTRACRAN SEL COM CAROTID INNOMINATE BILAT MOD SED 01/02/2016 Julieanne Cotton, MD [facility]  . IR GENERIC HISTORICAL  01/02/2016   IR ANGIO VERTEBRAL SEL SUBCLAVIAN INNOMINATE UNI R MOD SED 01/02/2016 Julieanne Cotton, MD [facility]  . RADIOLOGY WITH ANESTHESIA N/A 01/02/2016   Procedure: RADIOLOGY WITH ANESTHESIA;  Surgeon: Medication Radiologist, MD;  [facility] OR;  Service: Radiology;  Laterality: N/A;  . TEE WITHOUT CARDIOVERSION N/A 01/08/2016   Procedure: TRANSESOPHAGEAL ECHOCARDIOGRAM (TEE);  Surgeon: Laurey Morale, MD;  Location: [facility] ENDOSCOPY;  Service: Cardiovascular;  Laterality: N/A;    There were no vitals filed for this visit.      Subjective Assessment - 02/03/16 0820    Subjective "It's Monday."   Patient is  accompained by: Family member  daughter   Currently in Pain? Yes   Pain Score 3    Pain Location Head   Pain Descriptors / Indicators Headache   Pain Frequency Intermittent   Aggravating Factors  mental work               ADULT SLP TREATMENT - 02/03/16 0842      General Information   Behavior/Cognition Alert;Cooperative;Pleasant mood     Treatment Provided   Treatment provided Cognitive-Linquistic     Cognitive-Linquistic Treatment   Treatment focused on Cognition   Skilled Treatment Focus this date on functional math/language use. Pt required mod A to complete functional math word problems secondary to difficulty with comprehension, reasoning and sequencing. Pt verbalized being happy to work on something "hard".     Assessment / Recommendations / Plan   Plan Continue with current plan of care            SLP Short Term Goals - 02/03/16 1401      SLP SHORT TERM GOAL #1   Title Pt will complete complex naming tasks with 90% accuracy and rare min A   Status On-going     SLP SHORT TERM GOAL #2   Title Pt will alternate attention between 2 mildly complex cognitive linguistic tasks with 85% on each and occasional min A   Status On-going     SLP SHORT TERM GOAL #3   Title Pt will perform attention to detail  and abstract reasoning tasks with rare min A   Status On-going          SLP Long Term Goals - 01/29/16 1655      SLP LONG TERM GOAL #1   Title Pt will utilize compensations for aphasia over 15 minute complex conversation with no requests for clarificiation over 2 sessions   Time 6   Period Weeks   Status On-going     SLP LONG TERM GOAL #2   Title Pt will divide attention on 2 cognitive linguistic tasks with 85% accuracy on each and rare min A   Time 6   Period Weeks   Status On-going          Plan - 02/03/16 1359    Clinical Impression Statement Pt verbalized appreciation for working on high level reasoning limitations.    Speech Therapy  Frequency 2x / week   Duration --  8 weeks or 17 visits   Treatment/Interventions Compensatory strategies;Functional tasks;Patient/family education;Cueing hierarchy;Cognitive reorganization;Multimodal communcation approach;Internal/external aids;SLP instruction and feedback;Environmental controls   Potential to Achieve Goals Good   Consulted and Agree with Plan of Care Patient      Patient will benefit from skilled therapeutic intervention in order to improve the following deficits and impairments:   Cognitive communication deficit    Problem List Patient Active Problem List   Diagnosis Date Noted  . 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

## 2016-02-07 ENCOUNTER — Ambulatory Visit: Payer: BLUE CROSS/BLUE SHIELD | Admitting: Speech Pathology

## 2016-02-07 ENCOUNTER — Ambulatory Visit (INDEPENDENT_AMBULATORY_CARE_PROVIDER_SITE_OTHER): Payer: BLUE CROSS/BLUE SHIELD | Admitting: *Deleted

## 2016-02-07 DIAGNOSIS — I639 Cerebral infarction, unspecified: Secondary | ICD-10-CM

## 2016-02-07 DIAGNOSIS — R4701 Aphasia: Secondary | ICD-10-CM

## 2016-02-07 DIAGNOSIS — R41841 Cognitive communication deficit: Secondary | ICD-10-CM

## 2016-02-07 NOTE — Therapy (Signed)
[facility] [address] [place], [place], [address] Phone: [phone]   Fax:  [phone]  Speech Language Pathology Treatment  Patient Details  Name: Caroline [name] MRN: [id number] Date of Birth: Sep 08, 1955 Referring Provider: Dr. Pearlean BrownieSethi  Encounter Date: 02/07/2016      End of Session - 02/07/16 1209    Visit Number 5   Number of Visits 17   Date for SLP Re-Evaluation 03/12/16   SLP Start Time 1017   SLP Stop Time  1102   SLP Time Calculation (min) 45 min      Past Medical History:  Diagnosis Date  . Diabetes mellitus without complication (HCC)   . Reflux     Past Surgical History:  Procedure Laterality Date  . ABDOMINAL HYSTERECTOMY    . APPENDECTOMY    . EP IMPLANTABLE DEVICE N/A 01/08/2016   Procedure: Loop Recorder Insertion;  Surgeon: Marinus MawGregg W Taylor, MD;  [facility];  Service: Cardiovascular;  Laterality: N/A;  . IR GENERIC HISTORICAL  01/02/2016   IR ANGIO VERTEBRAL SEL VERTEBRAL UNI L MOD SED 01/02/2016 Julieanne CottonSanjeev Deveshwar, MD [facility]  . IR GENERIC HISTORICAL  01/02/2016   IR ANGIO INTRA EXTRACRAN SEL COM CAROTID INNOMINATE BILAT MOD SED 01/02/2016 Julieanne CottonSanjeev Deveshwar, MD [facility]  . IR GENERIC HISTORICAL  01/02/2016   IR ANGIO VERTEBRAL SEL SUBCLAVIAN INNOMINATE UNI R MOD SED 01/02/2016 Julieanne CottonSanjeev Deveshwar, MD [facility]  . RADIOLOGY WITH ANESTHESIA N/A 01/02/2016   Procedure: RADIOLOGY WITH ANESTHESIA;  Surgeon: Medication Radiologist, MD;  [facility];  Service: Radiology;  Laterality: N/A;  . TEE WITHOUT CARDIOVERSION N/A 01/08/2016   Procedure: TRANSESOPHAGEAL ECHOCARDIOGRAM (TEE);  Surgeon: Laurey Moralealton S McLean, MD;  [facility];  Service: Cardiovascular;  Laterality: N/A;    There were no vitals filed for this visit.      Subjective Assessment - 02/07/16 1025    Subjective "I had to work hard last session and it was good"   Patient is accompained by: Family  member   Special Tests daughter               ADULT SLP TREATMENT - 02/07/16 1029      General Information   Behavior/Cognition Alert;Cooperative;Pleasant mood     Treatment Provided   Treatment provided Cognitive-Linquistic     Pain Assessment   Pain Assessment No/denies pain     Cognitive-Linquistic Treatment   Treatment focused on Cognition   Skilled Treatment Mildly complex functional math tasks solved with occasional mod A and extended time. Facilitated  attention to detail reading schedules then solving simple time problems regarding schedule. Pt frequently required cues to attend to details to solve problmes.      Assessment / Recommendations / Plan   Plan Continue with current plan of care     Progression Toward Goals   Progression toward goals Progressing toward goals          SLP Education - 02/07/16 1204    Education provided Yes   Education Details strategies for attention to details   Person(s) Educated Patient;Child(ren)   Methods Explanation;Demonstration   Comprehension Verbalized understanding          SLP Short Term Goals - 02/07/16 1208      SLP SHORT TERM GOAL #1   Title Pt will complete complex naming tasks with 90% accuracy and rare min A   Time 1   Status On-going     SLP SHORT TERM GOAL #  2   Title Pt will alternate attention between 2 mildly complex cognitive linguistic tasks with 85% on each and occasional min A   Time 1   Status On-going     SLP SHORT TERM GOAL #3   Title Pt will perform attention to detail and abstract reasoning tasks with rare min A   Time 1   Status On-going          SLP Long Term Goals - 02/07/16 1208      SLP LONG TERM GOAL #1   Title Pt will utilize compensations for aphasia over 15 minute complex conversation with no requests for clarificiation over 2 sessions   Time 5   Period Weeks   Status On-going     SLP LONG TERM GOAL #2   Title Pt will divide attention on 2 cognitive linguistic tasks  with 85% accuracy on each and rare min A   Time 5   Period Weeks   Status On-going          Plan - 02/07/16 1205    Clinical Impression Statement Pt required mod A for functional math problem solving, reasoning and attention to detail on mildly complex tasks. Continue skilled ST to maximize verbal expression and cognition for possible return to work and driving.    Speech Therapy Frequency 2x / week   Treatment/Interventions Compensatory strategies;Functional tasks;Patient/family education;Cueing hierarchy;Cognitive reorganization;Multimodal communcation approach;Internal/external aids;SLP instruction and feedback;Environmental controls   Potential to Achieve Goals Good   Consulted and Agree with Plan of Care Patient      Patient will benefit from skilled therapeutic intervention in order to improve the following deficits and impairments:   Cognitive communication deficit  Aphasia    Problem List Patient Active Problem List   Diagnosis Date Noted  . 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

## 2016-02-11 ENCOUNTER — Telehealth: Payer: Self-pay | Admitting: *Deleted

## 2016-02-11 NOTE — Progress Notes (Signed)
remote 

## 2016-02-11 NOTE — Telephone Encounter (Signed)
Called and LVM for pt to call and r/s f/u on 11/20. Dr Ahern is working in hospital that day.   Can offer new patient slots on 11/21 or 11/22 per Dr Ahern.  **Please r/s if pt calls, thank you 

## 2016-02-11 NOTE — Telephone Encounter (Signed)
Pt r/s to 11/21 in a np slot. Just fyi

## 2016-02-11 NOTE — Telephone Encounter (Signed)
Noted, thank you

## 2016-02-13 ENCOUNTER — Ambulatory Visit: Payer: BLUE CROSS/BLUE SHIELD | Admitting: Speech Pathology

## 2016-02-13 DIAGNOSIS — R41841 Cognitive communication deficit: Secondary | ICD-10-CM

## 2016-02-13 DIAGNOSIS — R4701 Aphasia: Secondary | ICD-10-CM | POA: Diagnosis not present

## 2016-02-14 ENCOUNTER — Ambulatory Visit: Payer: BLUE CROSS/BLUE SHIELD | Admitting: Speech Pathology

## 2016-02-14 DIAGNOSIS — R4701 Aphasia: Secondary | ICD-10-CM | POA: Diagnosis not present

## 2016-02-14 DIAGNOSIS — R41841 Cognitive communication deficit: Secondary | ICD-10-CM

## 2016-02-14 NOTE — Therapy (Signed)
[facility] [address] [place], [place], [address] Phone: [phone]   Fax:  [phone]  Speech Language Pathology Treatment  Patient Details  Name: Caroline [name] MRN: [id number] Date of Birth: [birth year] Referring Provider: Dr. Leonie Man  Encounter Date: 02/13/2016      End of Session - 02/14/16 0943    Visit Number 6   Number of Visits 17   Date for SLP Re-Evaluation 03/12/16   SLP Start Time 1316   SLP Stop Time  1359   SLP Time Calculation (min) 43 min      Past Medical History:  Diagnosis Date  . Diabetes mellitus without complication ([place])   . Reflux     Past Surgical History:  Procedure Laterality Date  . ABDOMINAL HYSTERECTOMY    . APPENDECTOMY    . EP IMPLANTABLE DEVICE N/A 01/08/2016   Procedure: Loop Recorder Insertion;  Surgeon: Evans Lance, MD;  Location: [facility];  Service: Cardiovascular;  Laterality: N/A;  . IR GENERIC HISTORICAL  01/02/2016   IR ANGIO VERTEBRAL SEL VERTEBRAL UNI L MOD SED 01/02/2016 Luanne Bras, MD [facility]  . IR GENERIC HISTORICAL  01/02/2016   IR ANGIO INTRA EXTRACRAN SEL COM CAROTID INNOMINATE BILAT MOD SED 01/02/2016 Luanne Bras, MD [facility]  . IR GENERIC HISTORICAL  01/02/2016   IR ANGIO VERTEBRAL SEL SUBCLAVIAN INNOMINATE UNI R MOD SED 01/02/2016 Luanne Bras, MD [facility]  . RADIOLOGY WITH ANESTHESIA N/A 01/02/2016   Procedure: RADIOLOGY WITH ANESTHESIA;  Surgeon: Medication Radiologist, MD;  Location: [facility];  Service: Radiology;  Laterality: N/A;  . TEE WITHOUT CARDIOVERSION N/A 01/08/2016   Procedure: TRANSESOPHAGEAL ECHOCARDIOGRAM (TEE);  Surgeon: Larey Dresser, MD;  Location: [facility];  Service: Cardiovascular;  Laterality: N/A;    There were no vitals filed for this visit.      Subjective Assessment - 02/13/16 1331    Subjective Pt tearful - stating difficulty understanding disablitliy paperwork/phone call and was  5 min late due to being re-routed with road work    Patient is accompained by: Family member   Special Tests daughter               ADULT SLP TREATMENT - 02/13/16 1339      General Information   Behavior/Cognition Alert;Cooperative;Pleasant mood     Treatment Provided   Treatment provided Cognitive-Linquistic     Pain Assessment   Pain Assessment 0-10   Pain Score 4    Pain Location head   Pain Descriptors / Indicators Aching   Pain Intervention(s) Monitored during session     Cognitive-Linquistic Treatment   Treatment focused on Cognition   Skilled Treatment Mildly complex deduction puzzles to facilitate reading comprehension, reasoning, alternating attention with extended time and rare min A 90% accuracy. Added in alternating attention to auditory tasks which pt required occasional repetition of auditory stimuli with 80% accuracy when alternating to auditory task     Assessment / Recommendations / [place] with current plan of care     Progression Toward Goals   Progression toward goals Progressing toward goals            SLP Short Term Goals - 02/14/16 0942      SLP SHORT TERM GOAL #1   Title Pt will complete complex naming tasks with 90% accuracy and rare min A   Time 1   Status Achieved     SLP SHORT TERM GOAL #2  Title Pt will alternate attention between 2 mildly complex cognitive linguistic tasks with 85% on each and occasional min A   Time 1   Status Partially Met     SLP SHORT TERM GOAL #3   Title Pt will perform attention to detail and abstract reasoning tasks with rare min A   Time 1   Status On-going          SLP Long Term Goals - 02/14/16 0943      SLP LONG TERM GOAL #1   Title Pt will utilize compensations for aphasia over 15 minute complex conversation with no requests for clarificiation over 2 sessions   Time 4   Period Weeks   Status On-going     SLP LONG TERM GOAL #2   Title Pt will divide attention on 2  cognitive linguistic tasks with 85% accuracy on each and rare min A   Time 4   Period Weeks   Status On-going          Plan - 02/14/16 0941    Clinical Impression Statement Pt continues to require extended time, min to mod A for reasoning, attention and functional math problem solving  -Continue skilled ST to maximize verbal and cognitive skills for possible return to work   Speech Therapy Frequency 2x / week   Treatment/Interventions Compensatory strategies;Functional tasks;Patient/family education;Cueing hierarchy;Cognitive reorganization;Multimodal communcation approach;Internal/external aids;SLP instruction and feedback;Environmental controls   Potential to Achieve Goals Good   Consulted and Agree with Plan of Care Patient      Patient will benefit from skilled therapeutic intervention in order to improve the following deficits and impairments:   Cognitive communication deficit    Problem List Patient Active Problem List   Diagnosis Date Noted  . 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 Stroke (cerebrum) ([place]) 01/02/2016    Rylin Saez, Annye Rusk Ms, CCC-SLP 02/14/2016, 9:44 AM  [facility] [address] [place], [place], [address] Phone: [phone]   Fax:  [phone]   Name: Caroline [name] MRN: [id number] Date of Birth: [birth year]

## 2016-02-14 NOTE — Therapy (Signed)
[facility] [address] [place], [place], [address] Phone: [phone]   Fax:  [phone]  Speech Language Pathology Treatment  Patient Details  Name: Caroline [name] MRN: [id number] Date of Birth: [birth year] Referring Provider: Dr. Leonie Man  Encounter Date: 02/14/2016      End of Session - 02/14/16 1256    Visit Number 7   Number of Visits 17   Date for SLP Re-Evaluation 03/12/16   SLP Start Time 9518   SLP Stop Time  1146   SLP Time Calculation (min) 48 min      Past Medical History:  Diagnosis Date  . Diabetes mellitus without complication ([place])   . Reflux     Past Surgical History:  Procedure Laterality Date  . ABDOMINAL HYSTERECTOMY    . APPENDECTOMY    . EP IMPLANTABLE DEVICE N/A 01/08/2016   Procedure: Loop Recorder Insertion;  Surgeon: Evans Lance, MD;  Location: [place] CV LAB;  Service: Cardiovascular;  Laterality: N/A;  . IR GENERIC HISTORICAL  01/02/2016   IR ANGIO VERTEBRAL SEL VERTEBRAL UNI L MOD SED 01/02/2016 Luanne Bras, MD [facility]  . IR GENERIC HISTORICAL  01/02/2016   IR ANGIO INTRA EXTRACRAN SEL COM CAROTID INNOMINATE BILAT MOD SED 01/02/2016 Luanne Bras, MD [facility]  . IR GENERIC HISTORICAL  01/02/2016   IR ANGIO VERTEBRAL SEL SUBCLAVIAN INNOMINATE UNI R MOD SED 01/02/2016 Luanne Bras, MD [facility]  . RADIOLOGY WITH ANESTHESIA N/A 01/02/2016   Procedure: RADIOLOGY WITH ANESTHESIA;  Surgeon: Medication Radiologist, MD;  Location: [facility];  Service: Radiology;  Laterality: N/A;  . TEE WITHOUT CARDIOVERSION N/A 01/08/2016   Procedure: TRANSESOPHAGEAL ECHOCARDIOGRAM (TEE);  Surgeon: Larey Dresser, MD;  Location: [facility];  Service: Cardiovascular;  Laterality: N/A;    There were no vitals filed for this visit.      Subjective Assessment - 02/14/16 1100    Subjective "I'm better today"               ADULT SLP TREATMENT - 02/14/16 1100      General Information   Behavior/Cognition Alert;Cooperative;Pleasant mood     Treatment Provided   Treatment provided Cognitive-Linquistic     Pain Assessment   Pain Assessment 0-10   Pain Score 2    Pain Location Head   Pain Descriptors / Indicators Aching   Pain Intervention(s) Monitored during session     Cognitive-Linquistic Treatment   Treatment focused on Cognition   Skilled Treatment Facilitated reasoning and organization with midly complex deduction puzzle - garden plot- with rare min A, moderately complex reasoning/deduction task with extended time and  occasional mod verbal cues and visual cues for more complex task.. Divided attention between 2 simple liguistic tasks, one visual/verbal and one written with  80%accuracy on each - accuracy likely affected by aphasia as tasks involved word finding which pt required verbal cues.     Assessment / Recommendations / Plan   Plan Continue with current plan of care     Progression Toward Goals   Progression toward goals Progressing toward goals            SLP Short Term Goals - 02/14/16 1255      SLP SHORT TERM GOAL #1   Title Pt will complete complex naming tasks with 90% accuracy and rare min A   Time 1   Status Achieved     SLP SHORT TERM GOAL #2   Title Pt will alternate attention between  2 mildly complex cognitive linguistic tasks with 85% on each and occasional min A   Time 1   Status Partially Met     SLP SHORT TERM GOAL #3   Title Pt will perform attention to detail and abstract reasoning tasks with rare min A   Time 1   Status On-going          SLP Long Term Goals - 02/14/16 1255      SLP LONG TERM GOAL #1   Title Pt will utilize compensations for aphasia over 15 minute complex conversation with no requests for clarificiation over 2 sessions   Time 4   Period Weeks   Status On-going     SLP LONG TERM GOAL #2   Title Pt will divide attention on 2 cognitive linguistic tasks with 85% accuracy on each  and rare min A   Time 4   Period Weeks   Status On-going          Plan - 02/14/16 1255    Clinical Impression Statement Pt continues to require extended time, min to mod A for reasoning, attention and functional math problem solving  -Continue skilled ST to maximize verbal and cognitive skills for possible return to work   Speech Therapy Frequency 2x / week   Treatment/Interventions Compensatory strategies;Functional tasks;Patient/family education;Cueing hierarchy;Cognitive reorganization;Multimodal communcation approach;Internal/external aids;SLP instruction and feedback;Environmental controls   Potential to Achieve Goals Good   Consulted and Agree with Plan of Care Patient      Patient will benefit from skilled therapeutic intervention in order to improve the following deficits and impairments:   Cognitive communication deficit    Problem List Patient Active Problem List   Diagnosis Date Noted  . 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 Stroke (cerebrum) ([place]) 01/02/2016    Lovvorn, Annye Rusk MS, CCC-SLP 02/14/2016, 12:58 PM  [facility] [address] [place], [place], [address] Phone: [phone]   Fax:  [phone]   Name: Caroline [name] MRN: [id number] Date of Birth: [birth year]

## 2016-02-18 ENCOUNTER — Ambulatory Visit: Payer: BLUE CROSS/BLUE SHIELD | Admitting: Speech Pathology

## 2016-02-18 DIAGNOSIS — R4701 Aphasia: Secondary | ICD-10-CM | POA: Diagnosis not present

## 2016-02-18 DIAGNOSIS — R41841 Cognitive communication deficit: Secondary | ICD-10-CM

## 2016-02-18 NOTE — Therapy (Signed)
[facility] [address] [place], [place], [address] Phone: [phone]   Fax:  [phone]  Speech Language Pathology Treatment  Patient Details  Name: Caroline [name] MRN: [id number] Date of Birth: [birth year] Referring Provider: Dr. Leonie Man  Encounter Date: 02/18/2016      End of Session - 02/18/16 1510    Visit Number 8   Number of Visits 17   Date for SLP Re-Evaluation 03/12/16   SLP Start Time 27   SLP Stop Time  2694   SLP Time Calculation (min) 44 min      Past Medical History:  Diagnosis Date  . Diabetes mellitus without complication ([place])   . Reflux     Past Surgical History:  Procedure Laterality Date  . ABDOMINAL HYSTERECTOMY    . APPENDECTOMY    . EP IMPLANTABLE DEVICE N/A 01/08/2016   Procedure: Loop Recorder Insertion;  Surgeon: Evans Lance, MD;  Location: [facility];  Service: Cardiovascular;  Laterality: N/A;  . IR GENERIC HISTORICAL  01/02/2016   IR ANGIO VERTEBRAL SEL VERTEBRAL UNI L MOD SED 01/02/2016 Luanne Bras, MD [facility]  . IR GENERIC HISTORICAL  01/02/2016   IR ANGIO INTRA EXTRACRAN SEL COM CAROTID INNOMINATE BILAT MOD SED 01/02/2016 Luanne Bras, MD [facility]  . IR GENERIC HISTORICAL  01/02/2016   IR ANGIO VERTEBRAL SEL SUBCLAVIAN INNOMINATE UNI R MOD SED 01/02/2016 Luanne Bras, MD [facility]  . RADIOLOGY WITH ANESTHESIA N/A 01/02/2016   Procedure: RADIOLOGY WITH ANESTHESIA;  Surgeon: Medication Radiologist, MD;  Location: [facility];  Service: Radiology;  Laterality: N/A;  . TEE WITHOUT CARDIOVERSION N/A 01/08/2016   Procedure: TRANSESOPHAGEAL ECHOCARDIOGRAM (TEE);  Surgeon: Larey Dresser, MD;  Location: [facility];  Service: Cardiovascular;  Laterality: N/A;    There were no vitals filed for this visit.      Subjective Assessment - 02/18/16 1417    Subjective "I'm concered about talking and finding the right word when I go back to work"   Patient is accompained by: Family member   Special Tests daughter               ADULT SLP TREATMENT - 02/18/16 1420      General Information   Behavior/Cognition Alert;Cooperative;Pleasant mood     Treatment Provided   Treatment provided Cognitive-Linquistic     Pain Assessment   Pain Assessment 0-10   Pain Score 1    Pain Location Head   Pain Descriptors / Indicators Aching   Pain Intervention(s) Monitored during session     Cognitive-Linquistic Treatment   Treatment focused on Cognition   Skilled Treatment Pt tearful today - she reports "getting flustered" in group conversations which exacerbates her word finding difficulty. She finds it difficult to recover and word finding remains difficulty. Facilitated moderately complex to complex conversation generating pro's and con's for controversial topics - minimal 2-3 word finding difficulties notice, however pt required questioning cue and verbal cues to effectively communicate her opinions.  She expressed concern going back to work when she becomes "so flustered"     Assessment / Recommendations / Plan   Plan Continue with current plan of care     Progression Toward Goals   Progression toward goals Progressing toward goals            SLP Short Term Goals - 02/18/16 1509      SLP SHORT TERM GOAL #1   Title Pt will complete complex naming tasks with 90%  accuracy and rare min A   Time 1   Status Achieved     SLP SHORT TERM GOAL #2   Title Pt will alternate attention between 2 mildly complex cognitive linguistic tasks with 85% on each and occasional min A   Time 1   Status Partially Met     SLP SHORT TERM GOAL #3   Title Pt will perform attention to detail and abstract reasoning tasks with rare min A   Time 1   Status On-going          SLP Long Term Goals - 02/18/16 1509      SLP LONG TERM GOAL #1   Title Pt will utilize compensations for aphasia over 15 minute complex conversation with no requests for  clarificiation over 2 sessions   Baseline 10/31   Time 3   Period Weeks   Status On-going     SLP LONG TERM GOAL #2   Title Pt will divide attention on 2 cognitive linguistic tasks with 85% accuracy on each and rare min A   Time 3   Period Weeks   Status On-going          Plan - 02/18/16 1509    Clinical Impression Statement Pt continues to require extended time, min to mod A for  complex verbal expression, reasoning, attention and functional math problem solving  -Continue skilled ST to maximize verbal and cognitive skills for possible return to work      Patient will benefit from skilled therapeutic intervention in order to improve the following deficits and impairments:   Cognitive communication deficit  Aphasia    Problem List Patient Active Problem List   Diagnosis Date Noted  . 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 [place], [place], [address] Phone: [phone]   Fax:  [phone]   Name: NATHIFA RITTHALER MRN: [id number] Date of Birth: [birth year]

## 2016-02-20 ENCOUNTER — Ambulatory Visit: Payer: BLUE CROSS/BLUE SHIELD | Attending: Neurology

## 2016-02-20 DIAGNOSIS — R41841 Cognitive communication deficit: Secondary | ICD-10-CM | POA: Diagnosis present

## 2016-02-20 DIAGNOSIS — R4701 Aphasia: Secondary | ICD-10-CM | POA: Insufficient documentation

## 2016-02-21 NOTE — Patient Instructions (Signed)
  Please complete the assigned speech therapy homework and return it to your next session.  

## 2016-02-21 NOTE — Therapy (Signed)
[facility] [address] [place], [place], [address] Phone: [phone]   Fax:  [phone]  Speech Language Pathology Treatment  Patient Details  Name: Caroline [name] MRN: [id number] Date of Birth: 02/15/1956 Referring Provider: Dr. Leonie Man  Encounter Date: 02/20/2016      End of Session - 02/20/16 1656    Visit Number 9   Number of Visits 17   Date for SLP Re-Evaluation 03/12/16   SLP Start Time 0854   SLP Stop Time  0933   SLP Time Calculation (min) 39 min   Activity Tolerance Patient tolerated treatment well      Past Medical History:  Diagnosis Date  . Diabetes mellitus without complication ([facility])   . Reflux     Past Surgical History:  Procedure Laterality Date  . ABDOMINAL HYSTERECTOMY    . APPENDECTOMY    . EP IMPLANTABLE DEVICE N/A 01/08/2016   Procedure: Loop Recorder Insertion;  Surgeon: Evans Lance, MD;  Location: [facility];  Service: Cardiovascular;  Laterality: N/A;  . IR GENERIC HISTORICAL  01/02/2016   IR ANGIO VERTEBRAL SEL VERTEBRAL UNI L MOD SED 01/02/2016 Luanne Bras, MD [facility]  . IR GENERIC HISTORICAL  01/02/2016   IR ANGIO INTRA EXTRACRAN SEL COM CAROTID INNOMINATE BILAT MOD SED 01/02/2016 Luanne Bras, MD [facility]  . IR GENERIC HISTORICAL  01/02/2016   IR ANGIO VERTEBRAL SEL SUBCLAVIAN INNOMINATE UNI R MOD SED 01/02/2016 Luanne Bras, MD [facility]  . RADIOLOGY WITH ANESTHESIA N/A 01/02/2016   Procedure: RADIOLOGY WITH ANESTHESIA;  Surgeon: Medication Radiologist, MD;  Location: [facility];  Service: Radiology;  Laterality: N/A;  . TEE WITHOUT CARDIOVERSION N/A 01/08/2016   Procedure: TRANSESOPHAGEAL ECHOCARDIOGRAM (TEE);  Surgeon: Larey Dresser, MD;  Location: [facility];  Service: Cardiovascular;  Laterality: N/A;    There were no vitals filed for this visit.      Subjective Assessment - 02/20/16 0859    Subjective Pt provided homework to SLP  immediately .   Patient is accompained by: Family member  daughter               ADULT SLP TREATMENT - 02/21/16 1653      General Information   Behavior/Cognition Alert;Cooperative;Pleasant mood     Treatment Provided   Treatment provided Cognitive-Linquistic     Cognitive-Linquistic Treatment   Treatment focused on Cognition   Skilled Treatment SLP facilitated pt's cognitive linguistics by working through written tasks (detailed scrambled sentences) and conversation for alternating attention.  Pt req'd extra time consistnently to work through task. At first pt missed words routinely, and did not have good idea how to ensure she could improve her accuracy. SLP provided pt a suggestion which was not taken until after the next stimulus which pt also got incorrect. After using SLPs suggestion pt had success x3 with stimuli. Alternating attention between task and convdrsation was completed with aprox 75% success.     Assessment / Recommendations / Plan   Plan Continue with current plan of care          SLP Education - 02/21/16 1656    Education provided Yes   Education Details strategy for attention to detail task   Person(s) Educated Patient;Child(ren)   Methods Explanation;Demonstration   Comprehension Verbalized understanding;Returned demonstration;Verbal cues required          SLP Short Term Goals - 02/20/16 0856      SLP SHORT TERM GOAL #1   Title Pt  will complete complex naming tasks with 90% accuracy and rare min A   Time 1   Status Achieved     SLP SHORT TERM GOAL #2   Title Pt will alternate attention between 2 mildly complex cognitive linguistic tasks with 85% on each and occasional min A   Time 1   Status Partially Met     SLP SHORT TERM GOAL #3   Title Pt will perform attention to detail and abstract reasoning tasks with rare min A   Time 1   Status On-going          SLP Long Term Goals - 02/20/16 1657      SLP LONG TERM GOAL #1   Title Pt  will utilize compensations for aphasia over 15 minute complex conversation with no requests for clarificiation over 2 sessions   Baseline 10/31, 02-20-16   Time 3   Period Weeks   Status On-going     SLP LONG TERM GOAL #2   Title Pt will divide attention on 2 cognitive linguistic tasks with 85% accuracy on each and rare min A   Time 3   Period Weeks   Status On-going          Plan - 02/21/16 1657    Clinical Impression Statement Pt continues to require extended time, min to mod A for  complex verbal expression, reasoning, attention and functional math problem solving  -Continue skilled ST to maximize verbal and cognitive skills for possible return to work   Speech Therapy Frequency 2x / week   Duration --  8 weelks or 17 visits   Treatment/Interventions Compensatory strategies;Functional tasks;Patient/family education;Cueing hierarchy;Cognitive reorganization;Multimodal communcation approach;Internal/external aids;SLP instruction and feedback;Environmental controls   Potential to Achieve Goals Good      Patient will benefit from skilled therapeutic intervention in order to improve the following deficits and impairments:   Cognitive communication deficit  Aphasia    Problem List Patient Active Problem List   Diagnosis Date Noted  . 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 Stroke (cerebrum) ([facility]) 01/02/2016    [place] ,[place], [place]  02/21/2016, 4:58 PM  [facility] [address] [place], [place], [address] Phone: [phone]   Fax:  [phone]   Name: Caroline [name] MRN: [id number] Date of Birth: [birth year]

## 2016-02-26 ENCOUNTER — Ambulatory Visit: Payer: BLUE CROSS/BLUE SHIELD | Admitting: Speech Pathology

## 2016-02-26 DIAGNOSIS — R41841 Cognitive communication deficit: Secondary | ICD-10-CM

## 2016-02-26 DIAGNOSIS — R4701 Aphasia: Secondary | ICD-10-CM

## 2016-02-26 NOTE — Therapy (Signed)
[facility] [address] [place], [place], [address] Phone: [phone]   Fax:  [phone]  Speech Language Pathology Treatment  Patient Details  Name: Caroline [name] MRN: [id number] Date of Birth: 1956-03-24 Referring Provider: Dr. Leonie Man  Encounter Date: 02/26/2016      End of Session - 02/26/16 1131    Visit Number 10   Number of Visits 17   Date for SLP Re-Evaluation 03/12/16   SLP Start Time 0845   SLP Stop Time  0932   SLP Time Calculation (min) 47 min      Past Medical History:  Diagnosis Date  . Diabetes mellitus without complication ([name])   . Reflux     Past Surgical History:  Procedure Laterality Date  . ABDOMINAL HYSTERECTOMY    . APPENDECTOMY    . EP IMPLANTABLE DEVICE N/A 01/08/2016   Procedure: Loop Recorder Insertion;  Surgeon: Evans Lance, MD;  Location: [facility];  Service: Cardiovascular;  Laterality: N/A;  . IR GENERIC HISTORICAL  01/02/2016   IR ANGIO VERTEBRAL SEL VERTEBRAL UNI L MOD SED 01/02/2016 Luanne Bras, MD [facility]  . IR GENERIC HISTORICAL  01/02/2016   IR ANGIO INTRA EXTRACRAN SEL COM CAROTID INNOMINATE BILAT MOD SED 01/02/2016 Luanne Bras, MD [facility]  . IR GENERIC HISTORICAL  01/02/2016   IR ANGIO VERTEBRAL SEL SUBCLAVIAN INNOMINATE UNI R MOD SED 01/02/2016 Luanne Bras, MD [facility]  . RADIOLOGY WITH ANESTHESIA N/A 01/02/2016   Procedure: RADIOLOGY WITH ANESTHESIA;  Surgeon: Medication Radiologist, MD;  Location: [facility];  Service: Radiology;  Laterality: N/A;  . TEE WITHOUT CARDIOVERSION N/A 01/08/2016   Procedure: TRANSESOPHAGEAL ECHOCARDIOGRAM (TEE);  Surgeon: Larey Dresser, MD;  Location: [facility];  Service: Cardiovascular;  Laterality: N/A;    There were no vitals filed for this visit.      Subjective Assessment - 02/26/16 0851    Subjective "I worked several hours on this homework and my husband helped me"   Patient is  accompained by: Family member   Special Tests daughter               ADULT SLP TREATMENT - 02/26/16 0853      General Information   Behavior/Cognition Alert;Cooperative;Pleasant mood     Treatment Provided   Treatment provided Cognitive-Linquistic     Pain Assessment   Pain Assessment 0-10   Pain Score 10-Worst pain ever   Pain Location left maxilla   Pain Descriptors / Indicators Aching;Constant   Pain Intervention(s) --  Pt has dentist appointment later today     Cognitive-Linquistic Treatment   Treatment focused on Cognition   Skilled Treatment Reviwed homework - pt required usual min A - on moderately complex sentence unscamble pt required occasional mod A and extended time for organization of sentence and attention to details.  Complex card sort with 3 piles to facilitate attention to detail, atlernating attention and organization. Pt has completed this cart sort in past session, and has improved attention somewhat on familiart task, but continues to required extended time and ongoing cues for attending to all of the piles/detail. Organizatio and alternating attention  facilitated generating chart and plotting disorganized data and adding data wtih rare min A for attending to errors entering numbers in calculator. Pt reports this is similar to her job responsibilities.      Assessment / Recommendations / Plan   Plan Continue with current plan of care     Progression Toward Goals  Progression toward goals Progressing toward goals            SLP Short Term Goals - 02/26/16 1130      SLP SHORT TERM GOAL #1   Title Pt will complete complex naming tasks with 90% accuracy and rare min A   Time 1   Status Achieved     SLP SHORT TERM GOAL #2   Title Pt will alternate attention between 2 mildly complex cognitive linguistic tasks with 85% on each and occasional min A   Time 1   Status Partially Met     SLP SHORT TERM GOAL #3   Title Pt will perform attention to  detail and abstract reasoning tasks with rare min A   Time 1   Status On-going          SLP Long Term Goals - 02/26/16 1130      SLP LONG TERM GOAL #1   Title Pt will utilize compensations for aphasia over 15 minute complex conversation with no requests for clarificiation over 2 sessions   Baseline 10/31, 02-20-16, 02/25/16   Time 3   Period Weeks   Status Achieved     SLP LONG TERM GOAL #2   Title Pt will divide attention on 2 cognitive linguistic tasks with 85% accuracy on each and rare min A   Time 3   Period Weeks   Status On-going          Plan - 02/26/16 1130    Clinical Impression Statement Pt continues to require extended time, min to mod A for  complex verbal expression, reasoning, attention and functional math problem solving  -Continue skilled ST to maximize verbal and cognitive skills for possible return to work   Speech Therapy Frequency 2x / week   Treatment/Interventions Compensatory strategies;Functional tasks;Patient/family education;Cueing hierarchy;Cognitive reorganization;Multimodal communcation approach;Internal/external aids;SLP instruction and feedback;Environmental controls   Potential to Achieve Goals Good   Consulted and Agree with Plan of Care Patient      Patient will benefit from skilled therapeutic intervention in order to improve the following deficits and impairments:   Cognitive communication deficit  Aphasia    Problem List Patient Active Problem List   Diagnosis Date Noted  . 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 Stroke (cerebrum) ([name]) 01/02/2016    Trysten Berti, Annye Rusk MS, CCC-SLP 02/26/2016, 11:33 AM  [facility] [address] [place], [place], [address] Phone: [phone]   Fax:  [phone]   Name: Caroline [name] MRN: [id number] Date of Birth: [birth year]

## 2016-02-28 ENCOUNTER — Ambulatory Visit: Payer: BLUE CROSS/BLUE SHIELD

## 2016-02-28 DIAGNOSIS — R4701 Aphasia: Secondary | ICD-10-CM

## 2016-02-28 DIAGNOSIS — R41841 Cognitive communication deficit: Secondary | ICD-10-CM

## 2016-02-28 NOTE — Therapy (Signed)
[facility] [address] [place], [place], [address] Phone: [phone]   Fax:  [phone]  Speech Language Pathology Treatment  Patient Details  Name: Caroline [name] MRN: [id number] Date of Birth: 1956/04/03 Referring Provider: Dr. Leonie Man  Encounter Date: 02/28/2016      End of Session - 02/28/16 1017    Visit Number 11   Number of Visits 17   Date for SLP Re-Evaluation 03/12/16   SLP Start Time 0847   SLP Stop Time  0932   SLP Time Calculation (min) 45 min   Activity Tolerance Patient tolerated treatment well      Past Medical History:  Diagnosis Date  . Diabetes mellitus without complication ([facility])   . Reflux     Past Surgical History:  Procedure Laterality Date  . ABDOMINAL HYSTERECTOMY    . APPENDECTOMY    . EP IMPLANTABLE DEVICE N/A 01/08/2016   Procedure: Loop Recorder Insertion;  Surgeon: Evans Lance, MD;  Location: [facility];  Service: Cardiovascular;  Laterality: N/A;  . IR GENERIC HISTORICAL  01/02/2016   IR ANGIO VERTEBRAL SEL VERTEBRAL UNI L MOD SED 01/02/2016 Luanne Bras, MD [facility]  . IR GENERIC HISTORICAL  01/02/2016   IR ANGIO INTRA EXTRACRAN SEL COM CAROTID INNOMINATE BILAT MOD SED 01/02/2016 Luanne Bras, MD [facility]  . IR GENERIC HISTORICAL  01/02/2016   IR ANGIO VERTEBRAL SEL SUBCLAVIAN INNOMINATE UNI R MOD SED 01/02/2016 Luanne Bras, MD [facility]  . RADIOLOGY WITH ANESTHESIA N/A 01/02/2016   Procedure: RADIOLOGY WITH ANESTHESIA;  Surgeon: Medication Radiologist, MD;  Location: [facility];  Service: Radiology;  Laterality: N/A;  . TEE WITHOUT CARDIOVERSION N/A 01/08/2016   Procedure: TRANSESOPHAGEAL ECHOCARDIOGRAM (TEE);  Surgeon: Larey Dresser, MD;  Location: [facility];  Service: Cardiovascular;  Laterality: N/A;    There were no vitals filed for this visit.             ADULT SLP TREATMENT - 02/28/16 0844      General Information   Behavior/Cognition Alert;Cooperative;Pleasant mood     Treatment Provided   Treatment provided Cognitive-Linquistic     Pain Assessment   Pain Assessment 0-10   Pain Score 10-Worst pain ever   Pain Location lt maxilla   Pain Descriptors / Indicators Constant;Aching   Pain Intervention(s) Monitored during session     Cognitive-Linquistic Treatment   Treatment focused on Cognition   Skilled Treatment Pt related to SLP her problem solving/executive function re: toothache situation. Plan is to call her pharmacist and see if pain meds interfere with any other of her meds. Pt with one error which was not noted - pt stated she had to change two answers when she did that. In detailed attention task "(map), pt req'd usual min-mod cues for details. SLPeducated pt on need to double check answers.     Assessment / Recommendations / Plan   Plan Continue with current plan of care     Progression Toward Goals   Progression toward goals Progressing toward goals          SLP Education - 02/28/16 1017    Education provided Yes   Education Details need to double check responses   Person(s) Educated Patient;Child(ren)   Methods Explanation   Comprehension Verbalized understanding          SLP Short Term Goals - 02/28/16 1018      SLP SHORT TERM GOAL #1   Title Pt will complete complex naming  tasks with 90% accuracy and rare min A   Status Achieved     SLP SHORT TERM GOAL #2   Title Pt will alternate attention between 2 mildly complex cognitive linguistic tasks with 85% on each and occasional min A   Status Partially Met     SLP SHORT TERM GOAL #3   Title Pt will perform attention to detail and abstract reasoning tasks with rare min A   Time 1   Status Not Met          SLP Long Term Goals - 02/28/16 1018      SLP LONG TERM GOAL #1   Title Pt will utilize compensations for aphasia over 15 minute complex conversation with no requests for clarificiation over 2 sessions   Baseline  10/31, 02-20-16, 02/25/16   Time 3   Period Weeks   Status Achieved     SLP LONG TERM GOAL #2   Title Pt will divide attention on 2 cognitive linguistic tasks with 85% accuracy on each and rare min A   Time 3   Period Weeks   Status On-going          Plan - 02/28/16 1018    Clinical Impression Statement Pt continues to require extended time, min to mod A for  complex verbal expression, reasoning, attention and functional math problem solving  -Continue skilled ST to maximize verbal and cognitive skills for possible return to work   Speech Therapy Frequency 2x / week   Treatment/Interventions Compensatory strategies;Functional tasks;Patient/family education;Cueing hierarchy;Cognitive reorganization;Multimodal communcation approach;Internal/external aids;SLP instruction and feedback;Environmental controls   Potential to Achieve Goals Good   Consulted and Agree with Plan of Care Patient      Patient will benefit from skilled therapeutic intervention in order to improve the following deficits and impairments:   Cognitive communication deficit  Aphasia    Problem List Patient Active Problem List   Diagnosis Date Noted  . 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 Stroke (cerebrum) ([facility]) 01/02/2016    Melva Faux ,MS, CCC-SLP  02/28/2016, 10:19 AM  [facility] [address] [place], [place], [address] Phone: [phone]   Fax:  [phone]   Name: JALESSA PEYSER MRN: [id number] Date of Birth: [birth year]

## 2016-03-03 ENCOUNTER — Ambulatory Visit: Payer: BLUE CROSS/BLUE SHIELD | Admitting: Speech Pathology

## 2016-03-03 DIAGNOSIS — R41841 Cognitive communication deficit: Secondary | ICD-10-CM | POA: Diagnosis not present

## 2016-03-03 DIAGNOSIS — R4701 Aphasia: Secondary | ICD-10-CM

## 2016-03-03 NOTE — Therapy (Signed)
[facility] [address] [place], [place], [address] Phone: [phone]   Fax:  [phone]  Speech Language Pathology Treatment  Patient Details  Name: Caroline [name] MRN: [id number] Date of Birth: 1956-02-23 Referring Provider: Dr. Leonie Man  Encounter Date: 03/03/2016      End of Session - 03/03/16 1018    Visit Number 12      Past Medical History:  Diagnosis Date  . Diabetes mellitus without complication ([place])   . Reflux     Past Surgical History:  Procedure Laterality Date  . ABDOMINAL HYSTERECTOMY    . APPENDECTOMY    . EP IMPLANTABLE DEVICE N/A 01/08/2016   Procedure: Loop Recorder Insertion;  Surgeon: Evans Lance, MD;  Location: [facility];  Service: Cardiovascular;  Laterality: N/A;  . IR GENERIC HISTORICAL  01/02/2016   IR ANGIO VERTEBRAL SEL VERTEBRAL UNI L MOD SED 01/02/2016 Luanne Bras, MD [facility]  . IR GENERIC HISTORICAL  01/02/2016   IR ANGIO INTRA EXTRACRAN SEL COM CAROTID INNOMINATE BILAT MOD SED 01/02/2016 Luanne Bras, MD [facility]  . IR GENERIC HISTORICAL  01/02/2016   IR ANGIO VERTEBRAL SEL SUBCLAVIAN INNOMINATE UNI R MOD SED 01/02/2016 Luanne Bras, MD [facility]  . RADIOLOGY WITH ANESTHESIA N/A 01/02/2016   Procedure: RADIOLOGY WITH ANESTHESIA;  Surgeon: Medication Radiologist, MD;  Location: [facility];  Service: Radiology;  Laterality: N/A;  . TEE WITHOUT CARDIOVERSION N/A 01/08/2016   Procedure: TRANSESOPHAGEAL ECHOCARDIOGRAM (TEE);  Surgeon: Larey Dresser, MD;  Location: [facility];  Service: Cardiovascular;  Laterality: N/A;    There were no vitals filed for this visit.      Subjective Assessment - 03/03/16 0940    Subjective "I figured it  out that there were 2 types of nails"   Patient is accompained by: Family member   Special Tests daughter               ADULT SLP TREATMENT - 03/03/16 0942      General Information   Behavior/Cognition Alert;Cooperative;Pleasant mood     Treatment Provided   Treatment provided Cognitive-Linquistic     Pain Assessment   Pain Assessment 0-10   Pain Score 2    Pain Location head   Pain Descriptors / Indicators Headache   Pain Intervention(s) Monitored during session     Cognitive-Linquistic Treatment   Treatment focused on Cognition   Skilled Treatment Reviewed homework - pt accurate with attention to detail and organization of homework.  Facilitated linguistic fluency generating 2 different sentences for multiple meaning words with rare min cues. Complex naming targeted  naming items sequentially in a given category with extended time      Assessment / Recommendations / Plan   Plan Continue with current plan of care            SLP Short Term Goals - 03/03/16 1018      SLP SHORT TERM GOAL #1   Title Pt will complete complex naming tasks with 90% accuracy and rare min A   Status Achieved     SLP SHORT TERM GOAL #2   Title Pt will alternate attention between 2 mildly complex cognitive linguistic tasks with 85% on each and occasional min A   Status Partially Met     SLP SHORT TERM GOAL #3   Title Pt will perform attention to detail and abstract reasoning tasks with rare min A   Time 1   Status Not Met  SLP Long Term Goals - 03/03/16 1018      SLP LONG TERM GOAL #1   Title Pt will utilize compensations for aphasia over 15 minute complex conversation with no requests for clarificiation over 2 sessions   Baseline 10/31, 02-20-16, 02/25/16   Time 2   Period Weeks   Status Achieved     SLP LONG TERM GOAL #2   Title Pt will divide attention on 2 cognitive linguistic tasks with 85% accuracy on each and rare min A   Time 2   Period Weeks   Status On-going          Plan - 03/03/16 1009    Clinical Impression Statement Pt making improvement with naming, complex linguistic flexibility/linguistic reasoning with min and and timely today.  Cotninue skilled ST to maximize attention/cognition and language for return to work.   Speech Therapy Frequency 2x / week   Treatment/Interventions Compensatory strategies;Functional tasks;Patient/family education;Cueing hierarchy;Cognitive reorganization;Multimodal communcation approach;Internal/external aids;SLP instruction and feedback;Environmental controls   Potential to Achieve Goals Good   Consulted and Agree with Plan of Care Patient      Patient will benefit from skilled therapeutic intervention in order to improve the following deficits and impairments:   Aphasia    Problem List Patient Active Problem List   Diagnosis Date Noted  . 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

## 2016-03-05 ENCOUNTER — Ambulatory Visit: Payer: BLUE CROSS/BLUE SHIELD | Admitting: Speech Pathology

## 2016-03-05 DIAGNOSIS — R41841 Cognitive communication deficit: Secondary | ICD-10-CM | POA: Diagnosis not present

## 2016-03-05 DIAGNOSIS — R4701 Aphasia: Secondary | ICD-10-CM

## 2016-03-05 NOTE — Therapy (Signed)
[facility] [address] [place], [place], [address] Phone: [phone]   Fax:  [phone]  Speech Language Pathology Treatment  Patient Details  Name: Caroline [name] MRN: [id number] Date of Birth: 04/24/1955 Referring Provider: Dr. Leonie Man  Encounter Date: 03/05/2016      End of Session - 03/05/16 1210    Visit Number 13   Number of Visits 17   Date for SLP Re-Evaluation 03/12/16   SLP Start Time 0933   SLP Stop Time  7048   SLP Time Calculation (min) 44 min      Past Medical History:  Diagnosis Date  . Diabetes mellitus without complication ([place])   . Reflux     Past Surgical History:  Procedure Laterality Date  . ABDOMINAL HYSTERECTOMY    . APPENDECTOMY    . EP IMPLANTABLE DEVICE N/A 01/08/2016   Procedure: Loop Recorder Insertion;  Surgeon: Evans Lance, MD;  Location: [facility];  Service: Cardiovascular;  Laterality: N/A;  . IR GENERIC HISTORICAL  01/02/2016   IR ANGIO VERTEBRAL SEL VERTEBRAL UNI L MOD SED 01/02/2016 Luanne Bras, MD [facility]  . IR GENERIC HISTORICAL  01/02/2016   IR ANGIO INTRA EXTRACRAN SEL COM CAROTID INNOMINATE BILAT MOD SED 01/02/2016 Luanne Bras, MD [facility]  . IR GENERIC HISTORICAL  01/02/2016   IR ANGIO VERTEBRAL SEL SUBCLAVIAN INNOMINATE UNI R MOD SED 01/02/2016 Luanne Bras, MD [facility]  . RADIOLOGY WITH ANESTHESIA N/A 01/02/2016   Procedure: RADIOLOGY WITH ANESTHESIA;  Surgeon: Medication Radiologist, MD;  Location: [facility];  Service: Radiology;  Laterality: N/A;  . TEE WITHOUT CARDIOVERSION N/A 01/08/2016   Procedure: TRANSESOPHAGEAL ECHOCARDIOGRAM (TEE);  Surgeon: Larey Dresser, MD;  Location: [facility];  Service: Cardiovascular;  Laterality: N/A;    There were no vitals filed for this visit.      Subjective Assessment - 03/05/16 0947    Subjective "this took me forever" re: homework   Patient is accompained by: Family member   Special Tests daughter               ADULT SLP TREATMENT - 03/05/16 0947      General Information   Behavior/Cognition Alert;Cooperative;Pleasant mood     Treatment Provided   Treatment provided Cognitive-Linquistic     Pain Assessment   Pain Assessment No/denies pain     Cognitive-Linquistic Treatment   Treatment focused on Cognition   Skilled Treatment Pt verbalizing concerns that she is "more blunt" since her CVA. Daughter also reports pt has spoke about topics that she would have kept personal. Pt became tearful about this. We discussed family members come up with an inconspicuous signal to let pt know when she is being mildly inappropriate. Linguistic fluency targeted  generating synonyms with rhyming pairs of words,     Assessment / Recommendations / [place] with current plan of care          SLP Education - 03/05/16 1008    Education provided Yes   Education Details compensations for mildly reduced inhibition   Person(s) Educated Patient;Child(ren)   Methods Explanation   Comprehension Verbalized understanding          SLP Short Term Goals - 03/05/16 1209      SLP SHORT TERM GOAL #1   Title Pt will complete complex naming tasks with 90% accuracy and rare min A   Status Achieved     SLP SHORT TERM GOAL #2  Title Pt will alternate attention between 2 mildly complex cognitive linguistic tasks with 85% on each and occasional min A   Status Partially Met     SLP SHORT TERM GOAL #3   Title Pt will perform attention to detail and abstract reasoning tasks with rare min A   Time 1   Status Not Met          SLP Long Term Goals - 03/05/16 1018      SLP LONG TERM GOAL #1   Title Pt will utilize compensations for aphasia over 15 minute complex conversation with no requests for clarificiation over 2 sessions   Baseline 10/31, 02-20-16, 02/25/16   Time 2   Period Weeks   Status Achieved     SLP LONG TERM GOAL #2   Title Pt will divide  attention on 2 cognitive linguistic tasks with 85% accuracy on each and rare min A   Time 2   Period Weeks   Status On-going          Plan - 03/05/16 1208    Clinical Impression Statement Pt concerned about being "blunt" and sharing more personal topics she would not have prior to CVA. Pt continues to make good improvements with attention and langugae skills. Pt considering returning to work on a part time basis. Will see physician next week re: work and driving.  She verbalizes good awareness of driving safety.      Patient will benefit from skilled therapeutic intervention in order to improve the following deficits and impairments:   Aphasia  Cognitive communication deficit    Problem List Patient Active Problem List   Diagnosis Date Noted  . 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 [place], [place], [address] Phone: [phone]   Fax:  [phone]   Name: Caroline [name] MRN: [id number] Date of Birth: [birth year]

## 2016-03-07 LAB — CUP PACEART REMOTE DEVICE CHECK
Date Time Interrogation Session: 20171020181125
MDC IDC PG IMPLANT DT: 20170920

## 2016-03-07 NOTE — Progress Notes (Signed)
Carelink summary report received. Battery status OK. Normal device function. No new symptom episodes, tachy episodes, brady, or pause episodes. No new AF episodes. Monthly summary reports and ROV/PRN 

## 2016-03-09 ENCOUNTER — Ambulatory Visit: Payer: Self-pay | Admitting: Neurology

## 2016-03-09 ENCOUNTER — Ambulatory Visit: Payer: BLUE CROSS/BLUE SHIELD | Admitting: *Deleted

## 2016-03-09 ENCOUNTER — Ambulatory Visit (INDEPENDENT_AMBULATORY_CARE_PROVIDER_SITE_OTHER): Payer: BLUE CROSS/BLUE SHIELD | Admitting: *Deleted

## 2016-03-09 DIAGNOSIS — I639 Cerebral infarction, unspecified: Secondary | ICD-10-CM

## 2016-03-09 DIAGNOSIS — R41841 Cognitive communication deficit: Secondary | ICD-10-CM | POA: Diagnosis not present

## 2016-03-09 NOTE — Therapy (Signed)
[facility] [address] [place], [place], [address] Phone: [phone]   Fax:  [phone]  Speech Language Pathology Treatment  Patient Details  Name: Caroline [name] MRN: [id number] Date of Birth: 1955/11/12 Referring Provider: Dr. Leonie Man  Encounter Date: 03/09/2016      End of Session - 03/09/16 1642    Visit Number 14   Number of Visits 17   Date for SLP Re-Evaluation 03/12/16   SLP Start Time 1446   SLP Stop Time  1531   SLP Time Calculation (min) 45 min      Past Medical History:  Diagnosis Date  . Diabetes mellitus without complication ([place])   . Reflux     Past Surgical History:  Procedure Laterality Date  . ABDOMINAL HYSTERECTOMY    . APPENDECTOMY    . EP IMPLANTABLE DEVICE N/A 01/08/2016   Procedure: Loop Recorder Insertion;  Surgeon: Evans Lance, MD;  Location: [facility];  Service: Cardiovascular;  Laterality: N/A;  . IR GENERIC HISTORICAL  01/02/2016   IR ANGIO VERTEBRAL SEL VERTEBRAL UNI L MOD SED 01/02/2016 Luanne Bras, MD [facility]  . IR GENERIC HISTORICAL  01/02/2016   IR ANGIO INTRA EXTRACRAN SEL COM CAROTID INNOMINATE BILAT MOD SED 01/02/2016 Luanne Bras, MD [facility]  . IR GENERIC HISTORICAL  01/02/2016   IR ANGIO VERTEBRAL SEL SUBCLAVIAN INNOMINATE UNI R MOD SED 01/02/2016 Luanne Bras, MD [facility]  . RADIOLOGY WITH ANESTHESIA N/A 01/02/2016   Procedure: RADIOLOGY WITH ANESTHESIA;  Surgeon: Medication Radiologist, MD;  Location: [facility];  Service: Radiology;  Laterality: N/A;  . TEE WITHOUT CARDIOVERSION N/A 01/08/2016   Procedure: TRANSESOPHAGEAL ECHOCARDIOGRAM (TEE);  Surgeon: Larey Dresser, MD;  Location: [facility];  Service: Cardiovascular;  Laterality: N/A;    There were no vitals filed for this visit.      Subjective Assessment - 03/09/16 1458    Subjective "I am going to go back to work a little at a time and then start full days hopefully  at the beginning of the year"   Pain Score 0-No pain               ADULT SLP TREATMENT - 03/09/16 0001      General Information   Behavior/Cognition Alert;Cooperative;Pleasant mood     Treatment Provided   Treatment provided Cognitive-Linquistic     Pain Assessment   Pain Assessment No/denies pain   Pain Score 0-No pain     Cognitive-Linquistic Treatment   Treatment focused on Cognition   Skilled Treatment Pt focused on divided attention with mild-moderately complex writing task with min-mod A needed to stay on task and pt eventually finishing one task (simpler) prior to finishing more complex task; rehearsal and repetition of directives needed to complete as well; complex naming task with min A needed; pt concerned about life changes associated with CVA and how to manage; will see neurologist next week per pt.     Assessment / Recommendations / Plan   Plan Continue with current plan of care            SLP Short Term Goals - 03/09/16 1646      SLP SHORT TERM GOAL #1   Title Pt will complete complex naming tasks with 90% accuracy and rare min A   Time 1   Period Weeks   Status Achieved     SLP SHORT TERM GOAL #2   Title Pt will alternate attention between 2 mildly  complex cognitive linguistic tasks with 85% on each and occasional min A   Time 1   Period Weeks   Status Partially Met     SLP SHORT TERM GOAL #3   Title Pt will perform attention to detail and abstract reasoning tasks with rare min A   Time 1   Status Not Met          SLP Long Term Goals - 03/09/16 1646      SLP LONG TERM GOAL #1   Title Pt will utilize compensations for aphasia over 15 minute complex conversation with no requests for clarificiation over 2 sessions   Baseline 10/31, 02-20-16, 02/25/16   Time 1   Period Weeks   Status Achieved     SLP LONG TERM GOAL #2   Title Pt will divide attention on 2 cognitive linguistic tasks with 85% accuracy on each and rare min A   Time 1    Period Weeks   Status On-going          Plan - 03/09/16 1644    Clinical Impression Statement Pt concerned about how CVA has affected her and her husband's intimacy; Pt continues to make improvements with attention and language skills. Pt considering returning to work on a part time basis. Will see physician next week re: work and driving.  She verbalizes good awareness of driving safety.      Patient will benefit from skilled therapeutic intervention in order to improve the following deficits and impairments:   Cognitive communication deficit    Problem List Patient Active Problem List   Diagnosis Date Noted  . 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 [place], [place], [address] Phone: [phone]   Fax:  [phone]   Name: Caroline [name] MRN: [id number] Date of Birth: [birth year]

## 2016-03-09 NOTE — Progress Notes (Signed)
Carelink Summary Report / Loop Recorder 

## 2016-03-10 ENCOUNTER — Encounter: Payer: Self-pay | Admitting: Neurology

## 2016-03-10 ENCOUNTER — Ambulatory Visit: Payer: BLUE CROSS/BLUE SHIELD | Admitting: Speech Pathology

## 2016-03-10 ENCOUNTER — Ambulatory Visit (INDEPENDENT_AMBULATORY_CARE_PROVIDER_SITE_OTHER): Payer: BLUE CROSS/BLUE SHIELD | Admitting: Neurology

## 2016-03-10 VITALS — BP 147/87 | HR 67 | Ht 67.0 in | Wt 190.2 lb

## 2016-03-10 DIAGNOSIS — R41841 Cognitive communication deficit: Secondary | ICD-10-CM

## 2016-03-10 DIAGNOSIS — R400 Somnolence: Secondary | ICD-10-CM | POA: Diagnosis not present

## 2016-03-10 DIAGNOSIS — R0683 Snoring: Secondary | ICD-10-CM | POA: Diagnosis not present

## 2016-03-10 DIAGNOSIS — I63412 Cerebral infarction due to embolism of left middle cerebral artery: Secondary | ICD-10-CM

## 2016-03-10 NOTE — Progress Notes (Addendum)
[facility]    Provider:  Dr Lucia Gaskins Referring Provider: Premier, Mack Hook* Primary Care Physician:  Dennis Bast, MD  CC:  Embolic stroke  HPI:  Caroline [name] is a very nice 60 y.o. female here as a referral from Dr. Darrick Grinder for embolic stroke.PMHx diabetes and HTN. She presented to [facility] 01/02/2016 with aphasia, code stroke was called but she was out of the tpa window. No inciting event, she was in her usual state of health, no head trauma. CT angiogram however did show what was initially thought to be a fairly large occlusion. She had some waxing and waning. An MRI was performed which showed a relatively small infarct. She was taken for angiogram which demonstrated a lesion that was not amenable to mechanical thrombectomy and therefore no further action was taken. She is here with her family, husband and 2 children and has many questions.Aphasia resolved in the hospital but still has some word-finding difficulty but mostly resolved. No weakness, numbness or sensory changes or any other focal neurologic deficits or complaints, associated symptoms or modifiable factors.  She has been having a very emotional time in the last several months. She is terrified she will have another stroke. She is having difficulty performing in the bedroom with her husband. They don't understand why she had a stroke. We reviewed all images together and I answered all questions. She has pain all over her body that changes, I advised this is not a side effect of the stroke. I tried to reassure patient, this is a very emotional time and she is understandably worried and stressed and I recommended meeting with a therapist to talk about her emotions and fears. She has had a loop recorder placed and she is on Plavix and statin for secondary prevention. She snores, she is fatigued.   Reviewed notes, labs and imaging from outside physicians, which showed:  CT HEAD  01/02/2016 Possible early infarct  left subinsular/ operculum region.   CTA HEAD  01/02/2016 No significant stenosis left carotid terminus or M1 segment of the left middle cerebral artery. Occluded left middle cerebralartery M2 branch. Aplastic A1 segment right anterior cerebral artery.Fetal origin of posterior cerebral arteries. Right vertebral artery ends in a posterior inferior cerebellar artery distribution.   CTA NECK  01/02/2016 4 vessel arch with left vertebral artery arising directly from the arch. No significant stenosis of either carotid bifurcation.  Dg Chest 2 View 01/02/2016 No active cardiopulmonary disease.   Mr Brain Wo Contrast 01/02/2016 Present exam was performed in an abbreviated fashion per request. Small to slightly moderate-size acute left parietal lobeinfarct.There is a region of blood breakdown products within sulcus adjacent to the acute infarct which may represent clot within left middle cerebral artery branch vessel with slow flow/no flow in left middle cerebral artery vessels beyond this region. Mild chronic microvascular changes.   Ct Head Code Stroke W/o Cm 01/02/2016 1. Acute LEFT hemisphere affecting the insula and LEFT temporal lobe.  2. ASPECTS is 8.  3. Hyperdense LEFT MCA M2/M3 vessel in the sylvian fissure. Subtle asymmetric hyperdensity of the LEFT ICA terminus and LEFT M1 MCA could signify a more proximal thrombosis. CTA head/neck is pending.   Cerebral angio 1.distal LT MCA inferior division angular branch occlusion ,associated with a mod sized area of absent perfusion of the left parietal cortical subcortical junction.  2-D Echo  - Left ventricle: The cavity size was normal. Systolic function wasnormal. The estimated ejection fraction was in the range of 60%to 65%.  Wall motion was normal; there were no regional wallmotion abnormalities. Left ventricular diastolic functionparameters were normal.  LE venous Doppler negative for DVT   TEE 01/08/2016  Indication:  CVA  Sedation:Versed 5 mg IV, Fentanyl 75 mcg IV  Findings: Please see echo section for full report. Normal LV size with EF 60-65%. Normal RV size and systolic function. Normal atrial sizes, no thrombus visualized. There appeared to be a very small PFO, bubble study done twice to visualize a very small amount of bubble crossing. No significant tricuspid or mitral regurgitation. Trileaflet aortic valve with no stenosis or regurgitation. Normal caliber aorta with minimal plaque.   Impression: No definite source of embolus though she likely has a very small PFO  Review of Systems: Patient complains of symptoms per HPI as well as the following symptoms: fatigue, snoring, confusion. No CP, no SOB Pertinent negatives per HPI. All others negative.   Social History   Social History  . Marital status: Married    Spouse name: N/A  . Number of children: 2  . Years of education: HS   Occupational History  . [employer]    Social History Main Topics  . Smoking status: Never Smoker  . Smokeless tobacco: Never Used  . Alcohol use No  . Drug use: No  . Sexual activity: Not on file   Other Topics Concern  . Not on file   Social History Narrative   Lives at home with her husband.   Left-handed   Occasional caffeine use.       Family History  Problem Relation Age of Onset  . Atrial fibrillation Mother   . Suicidality Father     25    Past Medical History:  Diagnosis Date  . CVA (cerebral vascular accident) (HCC)   . Diabetes mellitus without complication (HCC)   . Reflux     Past Surgical History:  Procedure Laterality Date  . ABDOMINAL HYSTERECTOMY    . APPENDECTOMY    . EP IMPLANTABLE DEVICE N/A 01/08/2016   Procedure: Loop Recorder Insertion;  Surgeon: Marinus Maw, MD;  [facility];  Service: Cardiovascular;  Laterality: N/A;  . IR GENERIC HISTORICAL  01/02/2016   IR ANGIO VERTEBRAL SEL VERTEBRAL UNI L MOD SED 01/02/2016 Julieanne Cotton, MD  [facility]  . IR GENERIC HISTORICAL  01/02/2016   IR ANGIO INTRA EXTRACRAN SEL COM CAROTID INNOMINATE BILAT MOD SED 01/02/2016 Julieanne Cotton, MD [facility]  . IR GENERIC HISTORICAL  01/02/2016   IR ANGIO VERTEBRAL SEL SUBCLAVIAN INNOMINATE UNI R MOD SED 01/02/2016 Julieanne Cotton, MD [facility]  . RADIOLOGY WITH ANESTHESIA N/A 01/02/2016   Procedure: RADIOLOGY WITH ANESTHESIA;  Surgeon: Medication Radiologist, MD;  Location: [facility];  Service: Radiology;  Laterality: N/A;  . TEE WITHOUT CARDIOVERSION N/A 01/08/2016   Procedure: TRANSESOPHAGEAL ECHOCARDIOGRAM (TEE);  Surgeon: Laurey Morale, MD;  Location: [facility] ENDOSCOPY;  Service: Cardiovascular;  Laterality: N/A;    Current Outpatient Prescriptions  Medication Sig Dispense Refill  . atorvastatin (LIPITOR) 10 MG tablet Take 1 tablet (10 mg total) by mouth daily at 6 PM. 30 tablet 3  . cetirizine (ZYRTEC) 10 MG tablet Take 10 mg by mouth daily.    . clopidogrel (PLAVIX) 75 MG tablet Take 1 tablet (75 mg total) by mouth daily. 30 tablet 3  . FIBER PO Take by mouth daily.    . pantoprazole (PROTONIX) 40 MG tablet Take 40 mg by mouth daily.    . valACYclovir (VALTREX) 500  MG tablet Take 500 mg by mouth 2 (two) times daily.     No current facility-administered medications for this visit.     Allergies as of 03/10/2016 - Review Complete 03/10/2016  Allergen Reaction Noted  . Amoxicillin Rash 03/10/2016  . Azithromycin Rash 10/30/2014  . Homatropine Rash 05/29/2015  . Hydrocodone-homatropine Rash 10/30/2014  . Penicillin g Rash 04/23/2015  . Tape Rash 05/10/2014    Vitals: BP (!) 147/87   Pulse 67   Ht 5\' 7"  (1.702 m)   Wt 190 lb 3.2 oz (86.3 kg)   BMI 29.79 kg/m  Last Weight:  Wt Readings from Last 1 Encounters:  03/10/16 190 lb 3.2 oz (86.3 kg)   Last Height:   Ht Readings from Last 1 Encounters:  03/10/16 5\' 7"  (1.702 m)   Physical exam: Exam: Gen: NAD, conversant, well nourised, overweight, well groomed                      CV: RRR, no MRG. No Carotid Bruits. No peripheral edema, warm, nontender Eyes: Conjunctivae clear without exudates or hemorrhage  Neuro: Detailed Neurologic Exam  Speech:    Speech is normal; fluent and spontaneous with normal comprehension.  Cognition:    The patient is oriented to person, place, and time;     recent and remote memory intact;     language fluent;     normal attention, concentration,     fund of knowledge Cranial Nerves:    The pupils are equal, round, and reactive to light. The fundi are normal and spontaneous venous pulsations are present. Visual fields are full to finger confrontation. Extraocular movements are intact. Trigeminal sensation is intact and the muscles of mastication are normal. The face is symmetric. The palate elevates in the midline. Hearing intact. Voice is normal. Shoulder shrug is normal. The tongue has normal motion without fasciculations.   Coordination:    Normal finger to nose and heel to shin. Normal rapid alternating movements.   Gait:    Heel-toe and tandem gait are normal.   Motor Observation:    No asymmetry, no atrophy, and no involuntary movements noted. Tone:    Normal muscle tone.    Posture:    Posture is normal. normal erect    Strength:    Strength is V/V in the upper and lower limbs.      Sensation: intact to LT     Reflex Exam:  DTR's:    Deep tendon reflexes in the upper and lower extremities are normal bilaterally.   Toes:    The toes are downgoing bilaterally.   Clonus:    Clonus is absent.,      Assessment/Plan:  Caroline [name] is a 60 y.o. female with history of diabetes and hypertension presenting with aphasia. She did not receive IV t-PA due to late presentation.  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  Resultant  mild expressive aphasia resolved  MRI - Small to slightly  moderate-size acute left parietal lobe infarct  CTA Head and neck - Occluded left M3.   2D Echo - EF 60-65%. No source of embolus   LE Venous Dopplers - negative for DVT  TEE and loop completed   LDL - 68. Added Lipitor  HgbA1c 6.7  aspirin 81 mg daily prior to admission, switched to clopidogrel 75 mg daily for stroke prevention.  Patient  counseled to be compliant with her antithrombotic medications  Ongoing aggressive stroke risk factor management            I had a long d/w patient about her recent stroke, risk for recurrent stroke/TIAs, personally independently reviewed imaging studies and stroke evaluation results and answered questions.Continue Plavix and Lipitor for secondary stroke prevention and maintain strict control of hypertension with blood pressure goal below 130/90, diabetes with hemoglobin A1c goal below 6.5% and lipids with LDL cholesterol goal below 70 mg/dL.. I also advised the patient to eat a healthy diet with plenty of whole grains, cereals, fruits and vegetables, exercise regularly and maintain ideal body weight .Followup in the future with me in4-6 months or call earlier if necessary.  Naomie DeanAntonia Tashae Inda, MD  [facility] [address] [place], [place] [id number]  Phone [phone] Fax [phone]

## 2016-03-10 NOTE — Therapy (Signed)
[facility] [address] [place], [place], [address] Phone: [phone]   Fax:  [phone]  Speech Language Pathology Treatment  Patient Details  Name: Caroline [name] MRN: [id number] Date of Birth: 1955/12/25 Referring Provider: Dr. Leonie Man  Encounter Date: 03/10/2016      End of Session - 03/10/16 1201    SLP Start Time 0934   SLP Stop Time  1499   SLP Time Calculation (min) 49 min      Past Medical History:  Diagnosis Date  . Diabetes mellitus without complication ([place])   . Reflux     Past Surgical History:  Procedure Laterality Date  . ABDOMINAL HYSTERECTOMY    . APPENDECTOMY    . EP IMPLANTABLE DEVICE N/A 01/08/2016   Procedure: Loop Recorder Insertion;  Surgeon: Evans Lance, MD;  Location: [facility];  Service: Cardiovascular;  Laterality: N/A;  . IR GENERIC HISTORICAL  01/02/2016   IR ANGIO VERTEBRAL SEL VERTEBRAL UNI L MOD SED 01/02/2016 Luanne Bras, MD [facility]  . IR GENERIC HISTORICAL  01/02/2016   IR ANGIO INTRA EXTRACRAN SEL COM CAROTID INNOMINATE BILAT MOD SED 01/02/2016 Luanne Bras, MD [facility]  . IR GENERIC HISTORICAL  01/02/2016   IR ANGIO VERTEBRAL SEL SUBCLAVIAN INNOMINATE UNI R MOD SED 01/02/2016 Luanne Bras, MD [facility]  . RADIOLOGY WITH ANESTHESIA N/A 01/02/2016   Procedure: RADIOLOGY WITH ANESTHESIA;  Surgeon: Medication Radiologist, MD;  Location: [facility];  Service: Radiology;  Laterality: N/A;  . TEE WITHOUT CARDIOVERSION N/A 01/08/2016   Procedure: TRANSESOPHAGEAL ECHOCARDIOGRAM (TEE);  Surgeon: Larey Dresser, MD;  Location: [facility];  Service: Cardiovascular;  Laterality: N/A;    There were no vitals filed for this visit.      Subjective Assessment - 03/09/16 1458    Subjective "I am going to go back to work a little at a time and then start full days hopefully at the beginning of the year"   Pain Score 0-No pain                ADULT SLP TREATMENT - 03/10/16 1144      General Information   Behavior/Cognition Alert;Cooperative;Pleasant mood     Treatment Provided   Treatment provided Cognitive-Linquistic     Pain Assessment   Pain Assessment No/denies pain     Cognitive-Linquistic Treatment   Treatment focused on Cognition   Skilled Treatment Pt's spouse attended tx session. He reports pt with some difficulty completing household tasks as she "get's distracted by something else that has to be done" and is delayed on returning to initial task.  I trained pt and spouse in compensations for maintaining and completing tasks at home. See pt instructions. Pt divided attention between auditory and written word finding task with 85% on each and less than 2 requests for repetition of auditory stimuli over 18 minutes. Pt attended to details and divided attention between complex card sort and conversation with 85% accuracy on card sort.      Assessment / Recommendations / Plan   Plan Continue with current plan of care     Progression Toward Goals   Progression toward goals Progressing toward goals          SLP Education - 03/10/16 1155    Education provided Yes   Education Details compensations for attending to household chore until completion   Person(s) Educated Patient;Spouse;Child(ren)   Methods Explanation;Demonstration;Verbal cues;Handout   Comprehension Verbalized understanding  SLP Short Term Goals - 03/10/16 1159      SLP SHORT TERM GOAL #1   Title Pt will complete complex naming tasks with 90% accuracy and rare min A   Time 1   Period Weeks   Status Achieved     SLP SHORT TERM GOAL #2   Title Pt will alternate attention between 2 mildly complex cognitive linguistic tasks with 85% on each and occasional min A   Time 1   Period Weeks   Status Partially Met     SLP SHORT TERM GOAL #3   Title Pt will perform attention to detail and abstract reasoning tasks with rare min A   Time 1    Status Not Met          SLP Long Term Goals - 03/10/16 1159      SLP LONG TERM GOAL #1   Title Pt will utilize compensations for aphasia over 15 minute complex conversation with no requests for clarificiation over 2 sessions   Baseline 10/31, 02-20-16, 02/25/16   Time 1   Period Weeks   Status Achieved     SLP LONG TERM GOAL #2   Title Pt will divide attention on 2 cognitive linguistic tasks with 85% accuracy on each and rare min A   Time 1   Period Weeks   Status On-going          Plan - 03/10/16 1157    Clinical Impression Statement Divided attention and attention to details continues to improve. I discussed plan for d/c ST likely next week (2 visits) due to progress. Pt verbalized she would like to continue to learn/ carryover strategies to help her with success and attention at work.    Speech Therapy Frequency 2x / week   Treatment/Interventions Compensatory strategies;Functional tasks;Patient/family education;Cueing hierarchy;Cognitive reorganization;Multimodal communcation approach;Internal/external aids;SLP instruction and feedback;Environmental controls   Potential to Achieve Goals Good   Consulted and Agree with Plan of Care Patient      Patient will benefit from skilled therapeutic intervention in order to improve the following deficits and impairments:   Cognitive communication deficit    Problem List Patient Active Problem List   Diagnosis Date Noted  . 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 Stroke (cerebrum) ([place]) 01/02/2016    Lovvorn, Annye Rusk MS, CCC-SLP 03/10/2016, 12:03 PM  [facility] [address] [place], [place], [address] Phone: [phone]   Fax:  [phone]   Name: FELIPE CABELL MRN: [id number] Date of Birth: 12-Aug-1955

## 2016-03-10 NOTE — Patient Instructions (Addendum)
  Write down what you are working on  When you see another task write it down  Stop and think!! Which is more important? What you are doing or what you see that needs to be done   Read the room before you start a task - Stop and think what you are doing and what you need  Organize your space before you start working - clean clutter, get all supplies you need, read all directions first,  You can try using a timer - thinking about staying with a task until the timer goes off  Practice multi tasking, doing brain games while watching TV for conversing  Wear loose rubber band on your wrist - do not take it off until your chore is done

## 2016-03-10 NOTE — Patient Instructions (Addendum)
Remember to drink plenty of fluid, eat healthy meals and do not skip any meals. Try to eat protein with a every meal and eat a healthy snack such as fruit or nuts in between meals. Try to keep a regular sleep-wake schedule and try to exercise daily, particularly in the form of walking, 20-30 minutes a day, if you can.   As far as your medications are concerned, I would like to suggest: Continue Current medications  As far as diagnostic testing: Sleep evaluation   Our phone number is [phone]. We also have an after hours call service for urgent matters and there is a physician on-call for urgent questions. For any emergencies you know to call 911 or go to the nearest emergency room

## 2016-03-17 ENCOUNTER — Telehealth: Payer: Self-pay | Admitting: *Deleted

## 2016-03-17 ENCOUNTER — Ambulatory Visit: Payer: BLUE CROSS/BLUE SHIELD | Admitting: Speech Pathology

## 2016-03-17 DIAGNOSIS — R4701 Aphasia: Secondary | ICD-10-CM

## 2016-03-17 DIAGNOSIS — R41841 Cognitive communication deficit: Secondary | ICD-10-CM | POA: Diagnosis not present

## 2016-03-17 NOTE — Telephone Encounter (Signed)
We can do this but is there an original document or letter? Advise patient we don;t have any previous documentation or claim forms. But if she just need a letter for return to work she needs to give us for more details, how many hours etc and happy to do so. Also we cannot provide this asap we have other requests in line and it may take me up to 3-5 business days but I will try depending what she needs. Please get more information on what she needs.

## 2016-03-17 NOTE — Telephone Encounter (Signed)
Dr Lucia GaskinsAhern- are you okay with this? I do not see where we have written a letter or done any paperwork for her previously. You last saw her on 03/10/16.

## 2016-03-17 NOTE — Therapy (Signed)
[facility] [address] [place], [place], [address] Phone: [phone]   Fax:  [phone]  Speech Language Pathology Treatment  Patient Details  Name: Caroline [name] MRN: [id number] Date of Birth: 1955/05/21 Referring Provider: Dr. Leonie Man  Encounter Date: 03/17/2016      End of Session - 03/17/16 1057    Visit Number 16   Number of Visits 17   Date for SLP Re-Evaluation 03/12/16   SLP Start Time 6   SLP Stop Time  1059   SLP Time Calculation (min) 41 min      Past Medical History:  Diagnosis Date  . CVA (cerebral vascular accident) ([place])   . Diabetes mellitus without complication ([facility])   . Reflux     Past Surgical History:  Procedure Laterality Date  . ABDOMINAL HYSTERECTOMY    . APPENDECTOMY    . EP IMPLANTABLE DEVICE N/A 01/08/2016   Procedure: Loop Recorder Insertion;  Surgeon: Evans Lance, MD;  Location: [facility] CV LAB;  Service: Cardiovascular;  Laterality: N/A;  . IR GENERIC HISTORICAL  01/02/2016   IR ANGIO VERTEBRAL SEL VERTEBRAL UNI L MOD SED 01/02/2016 Luanne Bras, MD [facility]  . IR GENERIC HISTORICAL  01/02/2016   IR ANGIO INTRA EXTRACRAN SEL COM CAROTID INNOMINATE BILAT MOD SED 01/02/2016 Luanne Bras, MD [facility]  . IR GENERIC HISTORICAL  01/02/2016   IR ANGIO VERTEBRAL SEL SUBCLAVIAN INNOMINATE UNI R MOD SED 01/02/2016 Luanne Bras, MD [facility]  . RADIOLOGY WITH ANESTHESIA N/A 01/02/2016   Procedure: RADIOLOGY WITH ANESTHESIA;  Surgeon: Medication Radiologist, MD;  Location: [facility];  Service: Radiology;  Laterality: N/A;  . TEE WITHOUT CARDIOVERSION N/A 01/08/2016   Procedure: TRANSESOPHAGEAL ECHOCARDIOGRAM (TEE);  Surgeon: Larey Dresser, MD;  Location: [facility];  Service: Cardiovascular;  Laterality: N/A;    There were no vitals filed for this visit.      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 She will fix this for homework. Mildly complex functional reading with reasoning and inferencing with rare min questioning cues - 95% accuracy. Divided attention with mioderate complex card sort and moderately complex conversation with  95% on each.       Assessment / Recommendations / Plan   Plan Continue with current plan of care     Progression Toward Goals   Progression toward goals Progressing toward goals            SLP Short Term Goals - 03/17/16 1057      SLP SHORT TERM GOAL #1   Title Pt will complete complex naming tasks with 90% accuracy and rare min A   Time 1   Period Weeks   Status Achieved     SLP SHORT TERM GOAL #2   Title Pt will alternate attention between  2 mildly complex cognitive linguistic tasks with 85% on each and occasional min A   Time 1   Period Weeks   Status Partially Met     SLP SHORT TERM GOAL #3   Title Pt will perform attention to detail and abstract reasoning tasks with rare min A   Time 1   Status Not Met          SLP Long Term Goals - 03/17/16 1057      SLP LONG TERM GOAL #1   Title Pt will utilize compensations for aphasia over 15 minute complex conversation with no requests for clarificiation over 2 sessions   Baseline 10/31, 02-20-16, 02/25/16   Time 1   Period Weeks   Status Achieved     SLP LONG TERM GOAL #2   Title Pt will divide attention on 2 cognitive linguistic tasks with 85% accuracy on each and  rare min A   Time 1   Period Weeks   Status Achieved          Plan - 03/17/16 1054    Clinical Impression Statement Divided attention today with 95% accuracy and supervision cues. Pt cleared to return to work part time - returning to work on Thrusday this week. Continue skilled ST 1 more visit to maximize carryover of divided attention and compensations for attention. Pt in agreement.    Speech Therapy Frequency 2x / week   Treatment/Interventions Compensatory strategies;Functional tasks;Patient/family education;Cueing hierarchy;Cognitive reorganization;Multimodal communcation approach;Internal/external aids;SLP instruction and feedback;Environmental controls   Potential to Achieve Goals Good   Consulted and Agree with Plan of Care Patient      Patient will benefit from skilled therapeutic intervention in order to improve the following deficits and impairments:   Cognitive communication deficit  Aphasia    Problem List Patient Active Problem List   Diagnosis Date Noted  . 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 Stroke (cerebrum) ([facility]) 01/02/2016    Chevon Fomby, Annye Rusk MS, CCC-SLP 03/17/2016, 12:07 PM  [facility] [address] [place], [place], [address] Phone: [phone]   Fax:  [phone]   Name: Caroline [name] MRN: [id number] Date of Birth: May 02, 1955

## 2016-03-17 NOTE — Telephone Encounter (Signed)
Pt stopped in office around 1145am. She gave letter from [employer] for short term disability request. She is requesting letter be fax to include return to work information and restrictions. She stated she spoke to AA,MD last week at appt about this. She was working for a few hours each day and then is going to increase to 1/2 days. Advised per AA,MD this may take 3-5 days to complete as there are other requests ahead of her. I had patient sign record release form with Seth Bakeana Kendryck Lacroix at front desk to be able to send information to [employer]. Advised patient I will call her with any updates. She verbalized understanding.

## 2016-03-18 ENCOUNTER — Encounter: Payer: Self-pay | Admitting: *Deleted

## 2016-03-18 NOTE — Telephone Encounter (Signed)
Pt called office back. Read letter to pt. She is okay with wording. She is going to speak with boss and see if any restrictions needed. She will call back if anything further is needed. Dr Lucia GaskinsAhern also spoke to pt while on the phone.   Advised I will fax letter and last office note with release form to [employer]. She verbalized understanding.  Faxed letter to [phone] . Received confirmation.

## 2016-03-18 NOTE — Telephone Encounter (Signed)
LVM for pt to call to discuss letter. Advised I want to make sure we have everything we need in letter before we send to [employer]. Gave [facility] phone number and hours and advised Dr Lucia GaskinsAhern and I are not in the office on Friday. Asked her to call back as soon as possible.

## 2016-03-19 ENCOUNTER — Encounter: Payer: BLUE CROSS/BLUE SHIELD | Admitting: Speech Pathology

## 2016-03-24 ENCOUNTER — Encounter: Payer: Self-pay | Admitting: Speech Pathology

## 2016-03-24 NOTE — Therapy (Signed)
[facility] [address] [place], [place], [address] Phone: [phone]   Fax:  [phone]  Patient Details  Name: Caroline [name] MRN: [id number] Date of Birth: 18-Apr-1956 Referring Provider:  No ref. provider found  Encounter Date: 03/24/2016   SPEECH THERAPY DISCHARGE SUMMARY  Visits from Start of Care: 16  Current functional level related to goals / functional outcomes: See goals below   Remaining deficits: Mildl high level attention, may exacerbate with fatigue or in high stimulation environments   Education / Equipment: Compensations for cognitive impairments; CVA ed; cognitive activities to do at home      SLP Long Term Goals - 03/17/16 [place] #1   Title Pt will utilize compensations for aphasia over 15 minute complex conversation with no requests for clarificiation over 2 sessions   Baseline 10/31, 02-20-16, 02/25/16   Time 1   Period Weeks   Status Achieved     SLP LONG TERM GOAL #2   Title Pt will divide attention on 2 cognitive linguistic tasks with 85% accuracy on each and rare min A   Time 1   Period Weeks   Status Achieved      Plan: Patient agrees to discharge.  Patient goals were met. Patient is being discharged due to meeting the stated rehab goals.  ?????        Daiden Coltrane, Annye Rusk  MS, CCC-SLP 03/24/2016, 9:03 AM  [facility] [address] [place], [place], [address] Phone: [phone]   Fax:  [phone]

## 2016-03-26 ENCOUNTER — Encounter: Payer: BLUE CROSS/BLUE SHIELD | Admitting: Speech Pathology

## 2016-03-31 ENCOUNTER — Encounter: Payer: BLUE CROSS/BLUE SHIELD | Admitting: Speech Pathology

## 2016-04-02 ENCOUNTER — Encounter: Payer: BLUE CROSS/BLUE SHIELD | Admitting: Speech Pathology

## 2016-04-07 ENCOUNTER — Ambulatory Visit (INDEPENDENT_AMBULATORY_CARE_PROVIDER_SITE_OTHER): Payer: BLUE CROSS/BLUE SHIELD | Admitting: *Deleted

## 2016-04-07 DIAGNOSIS — I639 Cerebral infarction, unspecified: Secondary | ICD-10-CM | POA: Diagnosis not present

## 2016-04-08 NOTE — Progress Notes (Signed)
Carelink Summary Report / Loop Recorder 

## 2016-04-09 ENCOUNTER — Encounter: Payer: BLUE CROSS/BLUE SHIELD | Admitting: Speech Pathology

## 2016-04-18 LAB — CUP PACEART REMOTE DEVICE CHECK
Implantable Pulse Generator Implant Date: 20170920
MDC IDC SESS DTM: 20171119190957

## 2016-04-18 NOTE — Progress Notes (Signed)
Carelink summary report received. Battery status OK. Normal device function. No new symptom episodes, tachy episodes, brady, or pause episodes. No new AF episodes. Monthly summary reports and ROV/PRN 

## 2016-04-22 ENCOUNTER — Encounter: Payer: Self-pay | Admitting: Neurology

## 2016-04-22 ENCOUNTER — Ambulatory Visit (INDEPENDENT_AMBULATORY_CARE_PROVIDER_SITE_OTHER): Payer: BLUE CROSS/BLUE SHIELD | Admitting: Neurology

## 2016-04-22 VITALS — BP 108/66 | HR 70 | Resp 16 | Ht 67.0 in | Wt 187.0 lb

## 2016-04-22 DIAGNOSIS — R351 Nocturia: Secondary | ICD-10-CM

## 2016-04-22 DIAGNOSIS — E663 Overweight: Secondary | ICD-10-CM | POA: Diagnosis not present

## 2016-04-22 DIAGNOSIS — R51 Headache: Secondary | ICD-10-CM | POA: Diagnosis not present

## 2016-04-22 DIAGNOSIS — R0683 Snoring: Secondary | ICD-10-CM

## 2016-04-22 DIAGNOSIS — R519 Headache, unspecified: Secondary | ICD-10-CM

## 2016-04-22 DIAGNOSIS — Z8673 Personal history of transient ischemic attack (TIA), and cerebral infarction without residual deficits: Secondary | ICD-10-CM

## 2016-04-22 NOTE — Progress Notes (Signed)
Subjective:    Patient ID: Caroline [name] is a 61 y.o. female.  HPI     Huston Foley, MD, PhD [facility] [address] [address] [place], [place] [address]  Dear Desma Maxim,   I saw your patient, Caroline [name], upon your kind request in my clinic today for initial consultation of her sleep disorder, in particular, concern for underlying obstructive sleep apnea. The patient is accompanied by today. As you know, 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 I reviewed your office note from 03/10/2016.  She is on metformin for her DM, she has been on Plavix and lipitor since the stroke. She sees Dr. Luiz Iron at [facility] for her PCP.  She has occasional morning headaches, nocturia 1-2 times per night.  She has a implantable loop recorder. Snoring can be very loud, per husband's feedback to her.  She is back working FT, no residual Sx. Had slurring of speech. She is a nonsmoker, she does drink alcohol. Her MGF had a stroke. She had ST, no need for PT or OT.  She has multiple questions about her stroke and further management.  She snores, worse on her back. BT is 9:30 to 10 PM, WT at 6 AM. ESS is 13/24, FSS is 29/63. Some aching in legs at night. Her husband has obstructive sleep apnea and uses a CPAP. She has recently resumed driving. She gets fatigued easily but otherwise has no residual symptoms.   Her Past Medical History Is Significant For: Past Medical History:  Diagnosis Date  . CVA (cerebral vascular accident) (HCC)   . Diabetes mellitus without complication (HCC)   . Reflux    Her Past Surgical History Is Significant For: Past Surgical History:  Procedure Laterality Date  . ABDOMINAL HYSTERECTOMY    . APPENDECTOMY    . EP IMPLANTABLE DEVICE N/A  01/08/2016   Procedure: Loop Recorder Insertion;  Surgeon: Marinus Maw, MD;  Location: [facility] INVASIVE CV LAB;  Service: Cardiovascular;  Laterality: N/A;  . IR GENERIC HISTORICAL  01/02/2016   IR ANGIO VERTEBRAL SEL VERTEBRAL UNI L MOD SED 01/02/2016 Julieanne Cotton, MD [facility]-INTERV RAD  . IR GENERIC HISTORICAL  01/02/2016   IR ANGIO INTRA EXTRACRAN SEL COM CAROTID INNOMINATE BILAT MOD SED 01/02/2016 Julieanne Cotton, MD [facility]  . IR GENERIC HISTORICAL  01/02/2016   IR ANGIO VERTEBRAL SEL SUBCLAVIAN INNOMINATE UNI R MOD SED 01/02/2016 Julieanne Cotton, MD [facility]  . RADIOLOGY WITH ANESTHESIA N/A 01/02/2016   Procedure: RADIOLOGY WITH ANESTHESIA;  Surgeon: Medication Radiologist, MD;  Location: [facility] OR;  Service: Radiology;  Laterality: N/A;  . TEE WITHOUT CARDIOVERSION N/A 01/08/2016   Procedure: TRANSESOPHAGEAL ECHOCARDIOGRAM (TEE);  Surgeon: Laurey Morale, MD;  Location: [facility] ENDOSCOPY;  Service: Cardiovascular;  Laterality: N/A;    Her Family History Is Significant For: Family History  Problem Relation Age of Onset  . Atrial fibrillation Mother   . Suicidality Father     1971    Her Social History Is Significant For: Social History   Social History  . Marital status: Married    Spouse name: N/A  . Number of children: 2  . Years of education: HS   Occupational History  . [employer]    Social History Main Topics  . Smoking status: Never Smoker  . Smokeless  tobacco: Never Used  . Alcohol use No  . Drug use: No  . Sexual activity: Not Asked   Other Topics Concern  . None   Social History Narrative   Lives at home with her husband.   Left-handed   Occasional caffeine use.       Her Allergies Are:  Allergies  Allergen Reactions  . Amoxicillin Rash  . Azithromycin Rash  . Homatropine Rash  . Hydrocodone-Homatropine Rash  . Penicillin G Rash    Dyspepsia, ingestion  . Tape Rash  :   Her Current Medications Are:  Outpatient Encounter Prescriptions as of  04/22/2016  Medication Sig  . atorvastatin (LIPITOR) 10 MG tablet Take 1 tablet (10 mg total) by mouth daily at 6 PM.  . cetirizine (ZYRTEC) 10 MG tablet Take 10 mg by mouth daily.  . clopidogrel (PLAVIX) 75 MG tablet Take 1 tablet (75 mg total) by mouth daily.  Marland Kitchen FIBER PO Take by mouth daily.  Marland Kitchen losartan (COZAAR) 25 MG tablet Take 25 mg by mouth daily. as directed  . pantoprazole (PROTONIX) 40 MG tablet Take 40 mg by mouth daily.  . valACYclovir (VALTREX) 500 MG tablet Take 500 mg by mouth daily.    No facility-administered encounter medications on file as of 04/22/2016.   :  Review of Systems:  Out of a complete 14 point review of systems, all are reviewed and negative with the exception of these symptoms as listed below:  Review of Systems  Neurological:       Patient sometimes has trouble staying asleep, snoring, acid reflux at night, wakes up feeling tired, daytime fatigue, denies taking naps.   Epworth Sleepiness Scale 0= would never doze 1= slight chance of dozing 2= moderate chance of dozing 3= high chance of dozing  Sitting and reading:3 Watching TV:1 Sitting inactive in a public place (ex. Theater or meeting):2 As a passenger in a car for an hour without a break:0 Lying down to rest in the afternoon:3 Sitting and talking to someone:1 Sitting quietly after lunch (no alcohol):3 In a car, while stopped in traffic:0 Total:13   Objective:  Neurologic Exam  Physical Exam Physical Examination:   Vitals:   04/22/16 1542  BP: 108/66  Pulse: 70  Resp: 16    General Examination: The patient is a very pleasant 62 y.o. female in no acute distress. She appears well-developed and well-nourished and well groomed. Mildly anxious appearing.   HEENT: Normocephalic, atraumatic, pupils are equal, round and reactive to light and accommodation. Extraocular tracking is good without limitation to gaze excursion or nystagmus noted. Normal smooth pursuit is noted. Hearing is grossly  intact. Face is symmetric with normal facial animation and normal facial sensation. Speech is clear with no dysarthria noted. There is no hypophonia. There is no lip, neck/head, jaw or voice tremor. Neck is supple with full range of passive and active motion. There are no carotid bruits on auscultation. Oropharynx exam reveals: mild mouth dryness, good dental hygiene and mild airway crowding, due to smaller airway and redundant soft palate. Mallampati is class II. Tongue protrudes centrally and palate elevates symmetrically. Tonsils are absent. Neck size is 13 7/8 inches. She has a Mild overbite.    Chest: Clear to auscultation without wheezing, rhonchi or crackles noted.  Heart: S1+S2+0, regular and normal without murmurs, rubs or gallops noted.   Abdomen: Soft, non-tender and non-distended with normal bowel sounds appreciated on auscultation.  Extremities: There is no pitting edema in the distal lower extremities  bilaterally. Pedal pulses are intact.  Skin: Warm and dry without trophic changes noted. There are mild varicose and spider veins.  Musculoskeletal: exam reveals no obvious joint deformities, tenderness or joint swelling or erythema.   Neurologically:  Mental status: The patient is awake, alert and oriented in all 4 spheres. Her immediate and remote memory, attention, language skills and fund of knowledge are appropriate. There is no evidence of aphasia, agnosia, apraxia or anomia. Speech is clear with normal prosody and enunciation. Thought process is linear. Mood is normal and affect is normal.  Cranial nerves II - XII are as described above under HEENT exam. In addition: shoulder shrug is normal with equal shoulder height noted. Motor exam: Normal bulk, strength and tone is noted. There is no drift, tremor or rebound. Romberg is negative. Reflexes are 2+ throughout. Babinski: Toes are flexor bilaterally. Fine motor skills and coordination: intact with normal finger taps, normal hand  movements, normal rapid alternating patting, normal foot taps and normal foot agility.  Cerebellar testing: No dysmetria or intention tremor on finger to nose testing. Heel to shin is unremarkable bilaterally. There is no truncal or gait ataxia.  Sensory exam: intact to light touch in the upper and lower extremities.  Gait, station and balance: She stands easily. No veering to one side is noted. No leaning to one side is noted. Posture is age-appropriate and stance is narrow based. Gait shows normal stride length and normal pace. No problems turning are noted.               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 In light of her recent stroke Hx, OSA as a RF should be excluded.  I had a long chat with the patient about my findings and the diagnosis of OSA, its prognosis and treatment options. We talked about medical treatments, surgical interventions and non-pharmacological approaches. I explained in particular the risks and ramifications of untreated moderate to severe OSA, especially with respect to developing cardiovascular disease down the Road, including congestive heart failure, difficult to treat hypertension, cardiac arrhythmias, or stroke. Even type 2 diabetes has, in part, been linked to untreated OSA. Symptoms of untreated OSA include daytime sleepiness, memory problems, mood irritability and mood disorder such as depression and anxiety, lack of energy, as well as recurrent headaches, especially morning headaches. We talked about trying to maintain a healthy lifestyle in general, as well as the importance of weight control. I encouraged the patient to eat healthy, exercise daily and keep well hydrated, to keep a  scheduled bedtime and wake time routine, to not skip any meals and eat healthy snacks in between meals. I advised the patient not to drive when feeling sleepy. I recommended the following at this time: sleep study with potential positive airway pressure titration. (We will score hypopneas at 3% and split the sleep study into diagnostic and treatment portion, if the estimated. 2 hour AHI is >15/h).   I also had an extensive discussion today with her regarding secondary stroke prevention, and typical medical management after stroke. She is encouraged to exercise on a regular basis and stay well-hydrated with water and well rested. I gave her written instructions as well.  I explained the sleep test procedure  to the patient and also outlined possible surgical and non-surgical treatment options of OSA, including the use of a custom-made dental device (which would require a referral to a specialist dentist or oral surgeon), upper airway surgical options, such as pillar implants, radiofrequency surgery, tongue base surgery, and UPPP (which would involve a referral to an ENT surgeon). Rarely, jaw surgery such as mandibular advancement may be considered.  I also explained the CPAP treatment option to the patient, who indicated that she would be willing to try CPAP if the need arises. I explained the importance of being compliant with PAP treatment, not only for insurance purposes but primarily to improve Her symptoms, and for the patient's long term health benefit, including to reduce Her cardiovascular risks. I answered all her questions today and the patient was in agreement. I would like to see her back after the sleep study is completed and encouraged her to call with any interim questions, concerns, problems or updates.   Thank you very much for allowing me to participate in the care of this nice patient. If I can be of any further assistance to you please do not hesitate to talk to me.    Sincerely,   Huston Foley, MD, PhD  I spent 40 minutes in total face-to-face time with the patient, more than 50% of which was spent in counseling and coordination of care, reviewing test results, reviewing medication and discussing or reviewing the diagnosis of stroke, OSA, the prognosis and treatment options.

## 2016-04-22 NOTE — Patient Instructions (Signed)
Based on your symptoms and your exam I believe you are at risk for obstructive sleep apnea or OSA, and I think we should proceed with a sleep study to determine whether you do or do not have OSA and how severe it is. If you have more than mild OSA, I want you to consider treatment with CPAP. Please remember, the risks and ramifications of moderate to severe obstructive sleep apnea or OSA are: Cardiovascular disease, including congestive heart failure, stroke, difficult to control hypertension, arrhythmias, and even type 2 diabetes has been linked to untreated OSA. Sleep apnea causes disruption of sleep and sleep deprivation in most cases, which, in turn, can cause recurrent headaches, problems with memory, mood, concentration, focus, and vigilance. Most people with untreated sleep apnea report excessive daytime sleepiness, which can affect their ability to drive. Please do not drive if you feel sleepy.   I will likely see you back after your sleep study to go over the test results and where to go from there. We will call you after your sleep study to advise about the results (most likely, you will hear from Diana, my nurse) and to set up an appointment at the time, as necessary.    Our sleep lab administrative assistant, Dawn will meet with you or call you to schedule your sleep study. If you don't hear back from her by next week please feel free to call her at [phone]. This is her direct line and please leave a message with your phone number to call back if you get the voicemail box. She will call back as soon as possible.   Continue exercising regularly and take your medications as directed. As discussed, secondary prevention is key after a stroke. This means: taking care of blood sugar values or diabetes management, good blood pressure (hypertension) control and optimizing cholesterol management, exercising daily or regularly within your own mobility limitations of course, and overall cardiovascular  risk factor reduction, which includes screening for and treatment of obstructive sleep apnea (OSA) and weight management.       

## 2016-04-23 ENCOUNTER — Telehealth: Payer: Self-pay | Admitting: Neurology

## 2016-04-23 NOTE — Telephone Encounter (Signed)
Caroline [name], would you please call patient and ask if she would like her next appointment to be with one of the vascular doctors, Dr. Roda ShuttersXu? I can see if we can switch her to Dr. Roda ShuttersXu or Pearlean BrownieSethi.  thank you.

## 2016-04-24 NOTE — Telephone Encounter (Signed)
Dr. Pearlean BrownieSethi, patient wants to see a vascular/stroke specialist instead of me as a physician per her conversation with Dr. Frances FurbishAthar. FYI. I hope this is ok with you.  thank you

## 2016-04-24 NOTE — Telephone Encounter (Signed)
Called and spoke to Caroline [name]. Caroline [name] agreeable to see vascular specialist. Scheduled appt with Dr Pearlean BrownieSethi on 06/29/16 at 4pm, check in 345pm. Caroline [name] verbalized understanding and knows this is instead of appt with Dr Lucia GaskinsAhern on 07/08/16.

## 2016-04-28 NOTE — Telephone Encounter (Signed)
Okay if she is willing to wait that long

## 2016-05-03 ENCOUNTER — Ambulatory Visit (INDEPENDENT_AMBULATORY_CARE_PROVIDER_SITE_OTHER): Payer: BLUE CROSS/BLUE SHIELD | Admitting: Neurology

## 2016-05-03 DIAGNOSIS — G4733 Obstructive sleep apnea (adult) (pediatric): Secondary | ICD-10-CM

## 2016-05-03 DIAGNOSIS — G4720 Circadian rhythm sleep disorder, unspecified type: Secondary | ICD-10-CM

## 2016-05-07 ENCOUNTER — Ambulatory Visit (INDEPENDENT_AMBULATORY_CARE_PROVIDER_SITE_OTHER): Payer: BLUE CROSS/BLUE SHIELD | Admitting: *Deleted

## 2016-05-07 DIAGNOSIS — I639 Cerebral infarction, unspecified: Secondary | ICD-10-CM

## 2016-05-08 NOTE — Progress Notes (Signed)
Carelink Summary Report / Loop Recorder 

## 2016-05-08 NOTE — Addendum Note (Signed)
Addended by: Huston FoleyATHAR, Zekiel Torian on: 05/08/2016 12:51 PM   Modules accepted: Orders

## 2016-05-08 NOTE — Progress Notes (Signed)
Patient referred by Dr. Lucia GaskinsAhern, seen by me on 04/22/16, diagnostic PSG on 05/03/16.    Please call and notify the patient that the recent sleep study did confirm the diagnosis of Mild to moderate obstructive sleep apnea and that I recommend treatment for this in the form of CPAP, particularly d/t her stroke Hx. This will require a repeat sleep study for proper titration and mask fitting. Please explain to patient and arrange for a CPAP titration study. I have placed an order in the chart. Thanks, and [facility].  Huston FoleySaima Brooklin Rieger, MD, PhD [facility])

## 2016-05-08 NOTE — Procedures (Signed)
PATIENT'S NAME:  Caroline [name], Denesia F DOB:      1955/08/28      MR#:    [id number]     DATE OF RECORDING: 05/03/2016 REFERRING M.D.:  Diana EvesYuri M. Luiz Ironabeza, MD Study Performed:   Baseline Polysomnogram HISTORY: 61 year old with a history of type 2 diabetes, recent stroke in September 2017 of the left MCA, hypertension, hyperlipidemia and overweight state, who reports snoring and excessive daytime somnolence.  The patient endorsed the Epworth Sleepiness Scale at 13/24 points. The patient's weight 187 pounds with a height of 67 (inches), resulting in a BMI of 29.4 kg/m2. The patient's neck circumference measured 13.9 inches.   CURRENT MEDICATIONS: Atorvastatin, Cetirizine, Clipidogrel, Losartan, Pantoprazole and Valacyclovir   PROCEDURE:  This is a multichannel digital polysomnogram utilizing the Somnostar 11.2 system.  Electrodes and sensors were applied and monitored per AASM Specifications.   EEG, EOG, Chin and Limb EMG, were sampled at 200 Hz.  ECG, Snore and Nasal Pressure, Thermal Airflow, Respiratory Effort, CPAP Flow and Pressure, Oximetry was sampled at 50 Hz. Digital video and audio were recorded.      BASELINE STUDY  Lights Out was at 21:16 and Lights On at 05:02.  Total recording time (TRT) was 466.5 minutes, with a total sleep time (TST) of 387.5 minutes.   The patient's sleep latency was 52 minutes, which is prolonged.  REM latency was 60 minutes, which is mildly reduced.  The sleep efficiency was 83.1 %.     SLEEP ARCHITECTURE: WASO (Wake after sleep onset) was 27 minutes with mild sleep fragmentation noted.  There were 19.5 minutes in Stage N1, 273.5 minutes Stage N2, 11.5 minutes Stage N3 and 83 minutes in Stage REM.  The percentage of Stage N1 was 5.%, Stage N2 was 70.6%, which is increased, Stage N3 was 3.%, which is reduced and Stage R (REM sleep) was 21.4%, which is normal.   The arousals were noted as: 54 were spontaneous, 0 were associated with PLMs, 55 were associated with respiratory  events.   Audio and video analysis did not show any abnormal or unusual movements, behaviors, phonations or vocalizations.  The patient took 1 bathroom break. Intermittent mild to moderate snoring was noted.  EKG was in keeping with normal sinus rhythm (NSR).  RESPIRATORY ANALYSIS:  There were a total of 55 respiratory events:  2 obstructive apneas, 0 central apneas and 0 mixed apneas with a total of 2 apneas and an apnea index (AI) of .3 /hour. There were 53 hypopneas with a hypopnea index of 8.2 /hour. The patient also had 0 respiratory event related arousals (RERAs).      The total APNEA/HYPOPNEA INDEX (AHI) was 8.5/hour and the total RESPIRATORY DISTURBANCE INDEX was 8.5 /hour.  33 events occurred in REM sleep and 44 events in NREM. The REM AHI was 23.9 /hour, versus a non-REM AHI of 4.3. The patient spent 96 minutes of total sleep time in the supine position and 292 minutes in non-supine.. The supine AHI was 20.0 versus a non-supine AHI of 4.7.  OXYGEN SATURATION & C02:  The Wake baseline 02 saturation was 94%, with the lowest being 79%. Time spent below 89% saturation equaled 26 minutes.  PERIODIC LIMB MOVEMENTS:   The patient had a total of 0 Periodic Limb Movements.  The Periodic Limb Movement (PLM) index was 0 and the PLM Arousal index was 0/hour.    Post-study, the patient indicated that sleep was worse than usual.   IMPRESSION: 1. Obstructive Sleep Apnea (OSA) 2. Dysfunctions  associated with sleep stages or arousal from sleep  RECOMMENDATIONS: 1. This study demonstrates overall mild obstructive sleep apnea, moderate during REM sleep with a total AHI of 8.5/hour, REM AHI of 23.9/hour, and O2 nadir of 79%. Given the patient's medical history, particularly her recent stroke and her sleep related complaints, a full-night CPAP titration study is recommended to optimize therapy and monitor oxygen desaturations. Other treatment options may include avoidance of supine sleep position along  with weight loss, upper airway or jaw surgery in selected patients or the use of an oral appliance in certain patients. ENT evaluation and/or consultation with a maxillofacial surgeon or dentist may be feasible and some instances.    2. Please note that untreated obstructive sleep apnea carries additional perioperative morbidity. Patients with significant obstructive sleep apnea should receive perioperative PAP therapy and the surgeons and particularly the anesthesiologist should be informed of the diagnosis and the severity of the sleep disordered breathing. 3. The patient should be cautioned not to drive, work at heights, or operate dangerous or heavy equipment when tired or sleepy. Review and reiteration of good sleep hygiene measures should be pursued with any patient. 4. This study shows sleep fragmentation and abnormal sleep stage percentages; these are nonspecific findings and per se do not signify an intrinsic sleep disorder or a cause for the patient's sleep-related symptoms. Causes include (but are not limited to) the first night effect of the sleep study, circadian rhythm disturbances, medication effect or an underlying mood disorder or medical problem.  5. The patient will be seen in follow-up by Dr. Frances Furbish at [facility] for discussion of the test results and further management strategies. The referring provider will be notified of the test results.   I certify that I have reviewed the entire raw data recording prior to the issuance of this report in accordance with the Standards of Accreditation of the American Academy of Sleep Medicine (AASM)   Huston Foley, MD, PhD Diplomat, American Board of Psychiatry and Neurology (Neurology and Sleep Medicine)

## 2016-05-12 ENCOUNTER — Telehealth: Payer: Self-pay

## 2016-05-12 NOTE — Telephone Encounter (Signed)
-----   Message from Huston FoleySaima Athar, MD sent at 05/08/2016 12:51 PM EST ----- Patient referred by Dr. Lucia GaskinsAhern, seen by me on 04/22/16, diagnostic PSG on 05/03/16.    Please call and notify the patient that the recent sleep study did confirm the diagnosis of Mild to moderate obstructive sleep apnea and that I recommend treatment for this in the form of CPAP, particularly d/t her stroke Hx. This will require a repeat sleep study for proper titration and mask fitting. Please explain to patient and arrange for a CPAP titration study. I have placed an order in the chart. Thanks, and [facility].  Huston FoleySaima Athar, MD, PhD [facility])

## 2016-05-12 NOTE — Telephone Encounter (Signed)
I spoke to Lupita LeashDonna and she is wiling to proceed with titration study.  I have sent copy to PCP.

## 2016-05-24 ENCOUNTER — Ambulatory Visit (INDEPENDENT_AMBULATORY_CARE_PROVIDER_SITE_OTHER): Payer: BLUE CROSS/BLUE SHIELD | Admitting: Neurology

## 2016-05-24 DIAGNOSIS — G4720 Circadian rhythm sleep disorder, unspecified type: Secondary | ICD-10-CM

## 2016-05-24 DIAGNOSIS — G4733 Obstructive sleep apnea (adult) (pediatric): Secondary | ICD-10-CM | POA: Diagnosis not present

## 2016-05-25 NOTE — Progress Notes (Signed)
Patient referred by Dr. Lucia GaskinsAhern, seen by me on 04/22/16, diagnostic PSG on 05/03/16, CPAP study on 05/24/16.  Please call and inform patient that I have entered an order for treatment with positive airway pressure (PAP) treatment of obstructive sleep apnea (OSA). She did well during the latest sleep study with CPAP. We will, therefore, arrange for a machine for home use through a DME (durable medical equipment) company of Her choice; and I will see the patient back in follow-up in about 8-10 weeks. Please also explain to the patient that I will be looking out for compliance data, which can be downloaded from the machine (stored on an SD card, that is inserted in the machine) or via remote access through a modem, that is built into the machine. At the time of the followup appointment we will discuss sleep study results and how it is going with PAP treatment at home. Please advise patient to bring Her machine at the time of the first FU visit, even though this is cumbersome. Bringing the machine for every visit after that will likely not be needed, but often helps for the first visit to troubleshoot if needed. Please re-enforce the importance of compliance with treatment and the need for us to monitor compliance data - often an insurance requirement and actually good feedback for the patient as far as how they are doing.  Also remind patient, that any interim PAP machine or mask issues should be first addressed with the DME company, as they can often help better with technical and mask fit issues. Please ask if patient has a preference regarding DME company.  Please also make sure, the patient has a follow-up appointment with me in about 8-10 weeks from the setup date, thanks.  Once you have spoken to the patient - and faxed/routed report to PCP and referring MD (if other than PCP), you can close this encounter, thanks,   Huston FoleySaima Sydney Hasten, MD, PhD [facility] ([facility])

## 2016-05-25 NOTE — Addendum Note (Signed)
Addended by: Huston FoleyATHAR, [place] Dicicco on: 05/25/2016 08:31 AM   Modules accepted: Orders

## 2016-05-25 NOTE — Procedures (Signed)
PATIENT'S NAME:  Caroline [name], Beckie F DOB:      09-19-1955      MR#:    [id number]     DATE OF RECORDING: 05/24/2016 REFERRING M.D.:  Diana EvesYuri M. Luiz Ironabeza, MD Study Performed:   CPAP  Titration HISTORY:  61 year old with a history of type 2 diabetes, recent stroke in September 2017 of the left MCA, hypertension, hyperlipidemia and overweight state, who returns for a full night CPAP titration for her OSA. Patient had a baseline PSG on 05/03/16, which showed total AHI of 8.5/h, REM AHI of 23.9/hour, and O2 nadir of 79%. The patient's weight 187 pounds with a height of 67 (inches), resulting in a BMI of 29.4 kg/m2. The patient's neck circumference measured 13 inches.  CURRENT MEDICATIONS: Atorvastatin, Cetirizine, Clipidogrel, Losartan, Pantoprazole and Valacyclovir  PROCEDURE:  This is a multichannel digital polysomnogram utilizing the SomnoStar 11.2 system.  Electrodes and sensors were applied and monitored per AASM Specifications.   EEG, EOG, Chin and Limb EMG, were sampled at 200 Hz.  ECG, Snore and Nasal Pressure, Thermal Airflow, Respiratory Effort, CPAP Flow and Pressure, Oximetry was sampled at 50 Hz. Digital video and audio were recorded.       Patient was fitted with a small Eson nasal mask, which she preferred over the nasal pillows. CPAP was initiated at 5 cmH20 with heated humidity per AASM standards and pressure was advanced  to 7cmH20 because of hypopneas, apneas and desaturations.  At a PAP pressure of 7 cmH20, there was a reduction of the AHI to 0 with supine REM sleep achieved and O2 nadir of 90%.    Lights Out was at 21:23 and Lights On at 05:01. Total recording time (TRT) was 458.5 minutes, with a total sleep time (TST) of 439 minutes. The patient's sleep latency was 24 minutes. REM latency was 67 minutes, which is borderline reduced.  The sleep efficiency was 95.7 %.    SLEEP ARCHITECTURE: WASO (Wake after sleep onset)  was 10.5 minutes with minimal sleep fragmentation noted.  There were 13  minutes in Stage N1, 344.5 minutes Stage N2, 0 minutes Stage N3 and 81.5 minutes in Stage REM.  The percentage of Stage N1 was 3.%, Stage N2 was 78.5%, which is increased, Stage N3 was absent, and Stage R (REM sleep) was 18.6%, which is near normal.   The arousals were noted as: 40 were spontaneous, 0 were associated with PLMs, 0 were associated with respiratory events.  Audio and video analysis did not show any abnormal or unusual movements, behaviors, phonations or vocalizations.   The patient took no bathroom breaks. The EKG was in keeping with normal sinus rhythm (NSR).   RESPIRATORY ANALYSIS:  There was a total of 0 respiratory events: 0 obstructive apneas, 0 central apneas and 0 mixed apneas with a total of 0 apneas and an apnea index (AI) of 0 /hour. There were 0 hypopneas with a hypopnea index of 0/hour. The patient also had 0 respiratory event related arousals (RERAs).      The total APNEA/HYPOPNEA INDEX  (AHI) was 0 /hour and the total RESPIRATORY DISTURBANCE INDEX was 0 .hour  0 events occurred in REM sleep and 0 events in NREM. The REM AHI was 0 /hour versus a non-REM AHI of 0 /hour.  The patient spent 142.5 minutes of total sleep time in the supine position and 297 minutes in non-supine. The supine AHI was 0.0, versus a non-supine AHI of 0.0.  OXYGEN SATURATION & C02:  The baseline 02 saturation  was 96%, with the lowest being 90%. Time spent below 89% saturation equaled 0 minutes.  PERIODIC LIMB MOVEMENTS:    The patient had a total of 0 Periodic Limb Movements. The Periodic Limb Movement (PLM) index was 0 and the PLM Arousal index was 0 /hour.  Post-study, the patient indicated that sleep was the same or better than usual.   DIAGNOSIS 1. Obstructive Sleep Apnea  2. Dysfunctions associated with sleep stages or arousal from sleep   PLANS/RECOMMENDATIONS: 1. This study demonstrates resolution of the patient's obstructive sleep apnea with CPAP therapy. I will, therefore, start the  patient on home CPAP treatment at a pressure of 7 cm via small nasal mask with heated humidity. The patient should be reminded to be fully compliant with PAP therapy to improve sleep related symptoms and decrease long term cardiovascular risks. The patient should be reminded, that it may take up to 3 months to get fully used to using PAP with all planned sleep. The earlier full compliance is achieved, the better long term compliance tends to be. Please note that untreated obstructive sleep apnea carries additional perioperative morbidity. Patients with significant obstructive sleep apnea should receive perioperative PAP therapy and the surgeons and particularly the anesthesiologist should be informed of the diagnosis and the severity of the sleep disordered breathing. 2. The patient should be cautioned not to drive, work at heights, or operate dangerous or heavy equipment when tired or sleepy. Review and reiteration of good sleep hygiene measures should be pursued with any patient. 3. This study shows sleep fragmentation and abnormal sleep stage percentages; these are nonspecific findings and per se do not signify an intrinsic sleep disorder or a cause for the patient's sleep-related symptoms. Causes include (but are not limited to) the first night effect of the sleep study, circadian rhythm disturbances, medication effect or an underlying mood disorder or medical problem.  4. The patient will be seen in follow-up by Dr. Frances Furbish at [facility] for discussion of the test results and further management strategies. The referring provider will be notified of the test results.  I certify that I have reviewed the entire raw data recording prior to the issuance of this report in accordance with the Standards of Accreditation of the American Academy of Sleep Medicine (AASM)  Huston Foley, MD, PhD Diplomat, American Board of Psychiatry and Neurology (Neurology and Sleep Medicine)

## 2016-05-26 ENCOUNTER — Telehealth: Payer: Self-pay

## 2016-05-26 LAB — CUP PACEART REMOTE DEVICE CHECK
Date Time Interrogation Session: 20171219193956
Implantable Pulse Generator Implant Date: 20170920

## 2016-05-26 NOTE — Telephone Encounter (Signed)
-----   Message from Crisoforo Oxfordiana S Turner, RN sent at 05/25/2016  1:44 PM EST -----   ----- Message ----- From: Huston FoleySaima Athar, MD Sent: 05/25/2016   8:31 AM To: Crisoforo Oxfordiana S Turner, RN  Patient referred by Dr. Lucia GaskinsAhern, seen by me on 04/22/16, diagnostic PSG on 05/03/16, CPAP study on 05/24/16.  Please call and inform patient that I have entered an order for treatment with positive airway pressure (PAP) treatment of obstructive sleep apnea (OSA). She did well during the latest sleep study with CPAP. We will, therefore, arrange for a machine for home use through a DME (durable medical equipment) company of Her choice; and I will see the patient back in follow-up in about 8-10 weeks. Please also explain to the patient that I will be looking out for compliance data, which can be downloaded from the machine (stored on an SD card, that is inserted in the machine) or via remote access through a modem, that is built into the machine. At the time of the followup appointment we will discuss sleep study results and how it is going with PAP treatment at home. Please advise patient to bring Her machine at the time of the first FU visit, even though this is cumbersome. Bringing the machine for every visit after that will likely not be needed, but often helps for the first visit to troubleshoot if needed. Please re-enforce the importance of compliance with treatment and the need for us to monitor compliance data - often an insurance requirement and actually good feedback for the patient as far as how they are doing.  Also remind patient, that any interim PAP machine or mask issues should be first addressed with the DME company, as they can often help better with technical and mask fit issues. Please ask if patient has a preference regarding DME company.  Please also make sure, the patient has a follow-up appointment with me in about 8-10 weeks from the setup date, thanks.  Once you have spoken to the patient - and faxed/routed report to PCP  and referring MD (if other than PCP), you can close this encounter, thanks,   Huston FoleySaima Athar, MD, PhD [facility] ([facility]

## 2016-05-26 NOTE — Telephone Encounter (Signed)
I called pt, and reviewed her sleep study results. Pt is agreeable to starting a cpap at home. I advised pt that I will send this order to [facility], and they will call her within a week to get that set up, after they file with insurance. I reviewed cpap compliance expectations with pt. Pt is agreeable to a follow up on 07/29/2016 at 3:00pm. Pt verbalized understanding of results. Pt had no questions at this time but was encouraged to call back if questions arise.

## 2016-06-08 ENCOUNTER — Ambulatory Visit (INDEPENDENT_AMBULATORY_CARE_PROVIDER_SITE_OTHER): Payer: BLUE CROSS/BLUE SHIELD | Admitting: *Deleted

## 2016-06-08 DIAGNOSIS — I639 Cerebral infarction, unspecified: Secondary | ICD-10-CM

## 2016-06-09 NOTE — Progress Notes (Signed)
Carelink Summary Report / Loop Recorder 

## 2016-06-11 LAB — CUP PACEART REMOTE DEVICE CHECK
Implantable Pulse Generator Implant Date: 20170920
MDC IDC SESS DTM: 20180118204221

## 2016-06-29 ENCOUNTER — Ambulatory Visit: Payer: Self-pay | Admitting: Neurology

## 2016-06-30 LAB — CUP PACEART REMOTE DEVICE CHECK
Implantable Pulse Generator Implant Date: 20170920
MDC IDC SESS DTM: 20180217211340

## 2016-07-02 ENCOUNTER — Ambulatory Visit (INDEPENDENT_AMBULATORY_CARE_PROVIDER_SITE_OTHER): Payer: BLUE CROSS/BLUE SHIELD | Admitting: Neurology

## 2016-07-02 ENCOUNTER — Encounter: Payer: Self-pay | Admitting: Neurology

## 2016-07-02 ENCOUNTER — Telehealth: Payer: Self-pay

## 2016-07-02 VITALS — BP 126/77 | HR 56 | Wt 189.4 lb

## 2016-07-02 DIAGNOSIS — I639 Cerebral infarction, unspecified: Secondary | ICD-10-CM | POA: Diagnosis not present

## 2016-07-02 NOTE — Progress Notes (Signed)
[facility] [address] [place]. [place] [address] [phone]       OFFICE FOLLOW-UP NOTE  Ms. KYOKO ELSEA Date of Birth:  Oct 29, 1955 Medical Record Number:  [id number]   HPI: Ms Sifuentes is a 61 year old Caucasian lady with diabetes, hypertension, hyperlipidemia, mild obesity who had a left middle cerebral artery embolic infarct on September 2017 which was determined to be of cryptogenic etiology. She was seen and last visit byDr. Leland Johns   on 02/2116 and subsequently referred to Dr. Frances Furbish for sleep apnoea who did polysomnogram and confirmed sleep apnea. She has since been started on CPAP and is tolerating it well and has noted improvement. Return for follow-up today to see me. She is a complaint by daughter and husband. The patient states her stroke symptoms have improved significantly but she still has some word finding difficulties but equally towards the afternoon and and of the day. This is more prominent when she is tired and exhausted. 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 She states that her mind is not as sharp and it takes her longer to get things done. She does have a remote history of depression in the past but she is currently not on any medications. She states she gets tired easily and does not have the stamina to do a lot of physical work. She is tolerating Plavix well without bleeding or bruising. She states her blood pressure is well controlled today it is [id number]. She is tolerating Lipitor well without muscle aches and pains. She is using CPAP regularly and finds it helpful.. She has a loop recorder in place and so for paroxysmal atrial fibrillation has not yet been found  ROS:   14 system review of systems is positive for speech and word finding difficulties. Memory difficulties, depression and all other systems negative  PMH:  Past Medical History:  Diagnosis Date  . CVA (cerebral vascular accident) (HCC)   .  Diabetes mellitus without complication (HCC)   . Reflux     Social History:  Social History   Social History  . Marital status: Married    Spouse name: N/A  . Number of children: 2  . Years of education: HS   Occupational History  . [employer]    Social History Main Topics  . Smoking status: Never Smoker  . Smokeless tobacco: Never Used  . Alcohol use No  . Drug use: No  . Sexual activity: Not on file   Other Topics Concern  . Not on file   Social History Narrative   Lives at home with her husband.   Left-handed   Occasional caffeine use.       Medications:   Current Outpatient Prescriptions on File Prior to Visit  Medication Sig Dispense Refill  . atorvastatin (LIPITOR) 10 MG tablet Take 1 tablet (10 mg total) by mouth daily at 6 PM. 30 tablet 3  . clopidogrel (PLAVIX) 75 MG tablet Take 1 tablet (75 mg total) by mouth daily. 30 tablet 3  . FIBER PO Take by mouth daily.    Marland Kitchen losartan (COZAAR) 25 MG tablet Take 25 mg by mouth daily. as directed  0  . pantoprazole (PROTONIX) 40 MG tablet Take 40 mg by mouth daily.    . valACYclovir (VALTREX) 500 MG tablet Take 500 mg by mouth daily.      No current facility-administered medications on file prior to visit.     Allergies:   Allergies  Allergen Reactions  . Amoxicillin Rash  . Azithromycin Rash  . Homatropine Rash  . Hydrocodone-Homatropine Rash  . Penicillin G Rash    Dyspepsia, ingestion  . Tape Rash    Physical Exam General: well developed, well nourished middle-age Caucasian lady, seated, in no evident distress Head: head normocephalic and atraumatic.  Neck: supple with no carotid or supraclavicular bruits Cardiovascular: regular rate and rhythm, no murmurs Musculoskeletal: no deformity Skin:  no rash/petichiae Vascular:  Normal pulses all extremities Vitals:   07/02/16 1419  BP: 126/77  Pulse: (!) 56   Neurologic Exam Mental Status: Awake and fully alert. Oriented to place and time. Recent  and remote memory intact. Attention span, concentration and fund of knowledge appropriate. Mood and affect appropriate. Speech mostly fluent with only occasional word finding difficulties. Good naming, comprehension and repetition. Recall 3/3. Able to name 10 animals with for legs. Cranial Nerves: Fundoscopic exam reveals sharp disc margins. Pupils equal, briskly reactive to light. Extraocular movements full without nystagmus. Visual fields full to confrontation. Hearing intact. Facial sensation intact. Face, tongue, palate moves normally and symmetrically.  Motor: Normal bulk and tone. Normal strength in all tested extremity muscles. Sensory.: intact to touch ,pinprick .position and vibratory sensation.  Coordination: Rapid alternating movements normal in all extremities. Finger-to-nose and heel-to-shin performed accurately bilaterally. Gait and Station: Arises from chair without difficulty. Stance is normal. Gait demonstrates normal stride length and balance . Able to heel, toe and tandem walk without difficulty.  Reflexes: 1+ and symmetric. Toes downgoing.   NIHSS  0 Modified Rankin  2  ASSESSMENT: [age] year Caucasian lady with left middle cerebral artery branch infarct in September 2017 of cryptogenic etiology was doing well except for only minor's but finding difficulties. She is having some mild cognitive difficulties likely related to combination of mild depression as well as post stroke mild cognitive impairment    PLAN: I had a long d/w patient about her recent cryptogenic stroke, risk for recurrent stroke/TIAs, personally independently reviewed imaging studies and stroke evaluation results and answered questions.Continue Plavix}  for secondary stroke prevention and maintain strict control of hypertension with blood pressure goal below 130/90, diabetes with hemoglobin A1c goal below 6.5% and lipids with LDL cholesterol goal below 70 mg/dL. continue to use CPAP every night for her sleep apnea.  I advised the patient to increase participation in activities for stress relaxation like meditation and yoga. If her depression symptoms continue or get worse I recommend she see her primary physician for treatment options. I also advised the patient to eat a healthy diet with plenty of whole grains, cereals, fruits and vegetables, exercise regularly and maintain ideal body weight Followup in the future with me in one year or call earlier if necessary Greater than 50% of time during this 25 minute visit was spent on counseling,explanation of diagnosis of cryptogenic stroke, mild cognitive impairment, planning of further management, discussion with patient and family and coordination of care Delia HeadyPramod Aviendha Azbell, MD Medical Director [facility] Pager: [phone] 07/02/2016 5:30 PM Note: This document was prepared with digital dictation and possible smart phrase technology. Any transcriptional errors that result from this process are unintentional

## 2016-07-02 NOTE — Patient Instructions (Signed)
I had a long d/w patient about her recent cryptogenic stroke, risk for recurrent stroke/TIAs, personally independently reviewed imaging studies and stroke evaluation results and answered questions.Continue Plavix}  for secondary stroke prevention and maintain strict control of hypertension with blood pressure goal below 130/90, diabetes with hemoglobin A1c goal below 6.5% and lipids with LDL cholesterol goal below 70 mg/dL. continue to use CPAP every night for her sleep apnea. I advised the patient to increase participation in activities for stress relaxation like meditation and yoga. If her depression symptoms continue or get worse I recommend she see her primary physician for treatment options. I also advised the patient to eat a healthy diet with plenty of whole grains, cereals, fruits and vegetables, exercise regularly and maintain ideal body weight Followup in the future with me in one year or call earlier if necessary  Stroke Prevention Some medical conditions and behaviors are associated with an increased chance of having a stroke. You may prevent a stroke by making healthy choices and managing medical conditions. How can I reduce my risk of having a stroke?  Stay physically active. Get at least 30 minutes of activity on most or all days.  Do not smoke. It may also be helpful to avoid exposure to secondhand smoke.  Limit alcohol use. Moderate alcohol use is considered to be:  No more than 2 drinks per day for men.  No more than 1 drink per day for nonpregnant women.  Eat healthy foods. This involves:  Eating 5 or more servings of fruits and vegetables a day.  Making dietary changes that address high blood pressure (hypertension), high cholesterol, diabetes, or obesity.  Manage your cholesterol levels.  Making food choices that are high in fiber and low in saturated fat, trans fat, and cholesterol may control cholesterol levels.  Take any prescribed medicines to control cholesterol as  directed by your health care provider.  Manage your diabetes.  Controlling your carbohydrate and sugar intake is recommended to manage diabetes.  Take any prescribed medicines to control diabetes as directed by your health care provider.  Control your hypertension.  Making food choices that are low in salt (sodium), saturated fat, trans fat, and cholesterol is recommended to manage hypertension.  Ask your health care provider if you need treatment to lower your blood pressure. Take any prescribed medicines to control hypertension as directed by your health care provider.  If you are 4918-63 years of age, have your blood pressure checked every 3-5 years. If you are 63 years of age or older, have your blood pressure checked every year.  Maintain a healthy weight.  Reducing calorie intake and making food choices that are low in sodium, saturated fat, trans fat, and cholesterol are recommended to manage weight.  Stop drug abuse.  Avoid taking birth control pills.  Talk to your health care provider about the risks of taking birth control pills if you are over 63 years old, smoke, get migraines, or have ever had a blood clot.  Get evaluated for sleep disorders (sleep apnea).  Talk to your health care provider about getting a sleep evaluation if you snore a lot or have excessive sleepiness.  Take medicines only as directed by your health care provider.  For some people, aspirin or blood thinners (anticoagulants) are helpful in reducing the risk of forming abnormal blood clots that can lead to stroke. If you have the irregular heart rhythm of atrial fibrillation, you should be on a blood thinner unless there is a good  reason you cannot take them.  Understand all your medicine instructions.  Make sure that other conditions (such as anemia or atherosclerosis) are addressed. Get help right away if:  You have sudden weakness or numbness of the face, arm, or leg, especially on one side of the  body.  Your face or eyelid droops to one side.  You have sudden confusion.  You have trouble speaking (aphasia) or understanding.  You have sudden trouble seeing in one or both eyes.  You have sudden trouble walking.  You have dizziness.  You have a loss of balance or coordination.  You have a sudden, severe headache with no known cause.  You have new chest pain or an irregular heartbeat. Any of these symptoms may represent a serious problem that is an emergency. Do not wait to see if the symptoms will go away. Get medical help at once. Call your local emergency services (911 in [place] Do not drive yourself to the hospital. This information is not intended to replace advice given to you by your health care provider. Make sure you discuss any questions you have with your health care provider. Document Released: 05/14/2004 Document Revised: 09/12/2015 Document Reviewed: 10/07/2012 Elsevier Interactive Patient Education  2017 [employer].

## 2016-07-02 NOTE — Telephone Encounter (Signed)
Patient is changing from Dr.Ahern to Dr. Pearlean BrownieSethi. Dr.Sethi and Dr. Lucia GaskinsAhern are aware of this change.

## 2016-07-06 ENCOUNTER — Ambulatory Visit (INDEPENDENT_AMBULATORY_CARE_PROVIDER_SITE_OTHER): Payer: BLUE CROSS/BLUE SHIELD | Admitting: *Deleted

## 2016-07-06 DIAGNOSIS — I639 Cerebral infarction, unspecified: Secondary | ICD-10-CM | POA: Diagnosis not present

## 2016-07-07 NOTE — Progress Notes (Signed)
Carelink Summary Report / Loop Recorder 

## 2016-07-08 ENCOUNTER — Ambulatory Visit: Payer: Self-pay | Admitting: Neurology

## 2016-07-19 LAB — CUP PACEART REMOTE DEVICE CHECK
Implantable Pulse Generator Implant Date: 20170920
MDC IDC SESS DTM: 20180319213813

## 2016-07-19 NOTE — Progress Notes (Signed)
Carelink summary report received. Battery status OK. Normal device function. No new symptom episodes, tachy episodes, brady, or pause episodes. No new AF episodes. Monthly summary reports and ROV/PRN 

## 2016-07-29 ENCOUNTER — Ambulatory Visit (INDEPENDENT_AMBULATORY_CARE_PROVIDER_SITE_OTHER): Payer: BLUE CROSS/BLUE SHIELD | Admitting: Neurology

## 2016-07-29 ENCOUNTER — Encounter: Payer: Self-pay | Admitting: Neurology

## 2016-07-29 VITALS — BP 142/81 | HR 63 | Resp 20 | Ht 63.0 in | Wt 190.0 lb

## 2016-07-29 DIAGNOSIS — Z9989 Dependence on other enabling machines and devices: Secondary | ICD-10-CM | POA: Diagnosis not present

## 2016-07-29 DIAGNOSIS — I639 Cerebral infarction, unspecified: Secondary | ICD-10-CM | POA: Diagnosis not present

## 2016-07-29 DIAGNOSIS — G4733 Obstructive sleep apnea (adult) (pediatric): Secondary | ICD-10-CM | POA: Diagnosis not present

## 2016-07-29 NOTE — Patient Instructions (Addendum)
Please continue using your CPAP regularly. While your insurance requires that you use CPAP at least 4 hours each night on 70% of the nights, I recommend, that you not skip any nights and use it throughout the night if you can. Getting used to CPAP and staying with the treatment long term does take time and patience and discipline. Untreated obstructive sleep apnea when it is moderate to severe can have an adverse impact on cardiovascular health and raise her risk for heart disease, arrhythmias, hypertension, congestive heart failure, stroke and diabetes. Untreated obstructive sleep apnea causes sleep disruption, nonrestorative sleep, and sleep deprivation. This can have an impact on your day to day functioning and cause daytime sleepiness and impairment of cognitive function, memory loss, mood disturbance, and problems focussing. Using CPAP regularly can improve these symptoms.  Keep up the good work! We will see you back in 6 months for sleep apnea check up, and if you continue to do well on CPAP I will see you once a year thereafter. You can see one of our nurse practitioners as you are stable.

## 2016-07-29 NOTE — Progress Notes (Signed)
Subjective:    Caroline [name] ID: Caroline Caroline [name] Caroline [name] is a 61 y.o. female.  HPI     Interim history:    Caroline Caroline [name] Caroline [name] is a 61 year old left-handed woman with an underlying medical history of type 2 diabetes, recent stroke in September 2017 of Caroline Caroline [name] left MCA, hypertension, hyperlipidemia and overweight state, who presents for follow-up consultation of her obstructive sleep apnea, after recent sleep study testing. Caroline Caroline [name] Caroline [name] is unaccompanied today. I first met her on 04/22/2016 at Caroline Caroline [name] request of Dr. Jaynee [name], at which time she reported snoring and excessive daytime somnolence. She was invited for a sleep study testing. She had a baseline sleep study, followed by a CPAP titration study. I went over her test results in detail with her today. Her baseline sleep study from 05/03/2016 showed a sleep efficiency of 83.1%, sleep latency was prolonged at 52 minutes, REM latency was 60 minutes. She had an increased percentage of stage II sleep, a very small amount of slow-wave sleep and REM sleep was 21.4%. She had a total AHI of 8.5 per hour, REM AHI was 23.9 per hour, supine AHI was 20 per hour. Average oxygen saturation was 84%, nadir was 79%. Time below 89% saturation was 26 minutes. She had no significant PLMS. Based on her sleep-related complaints, her medical history including stroke, and her sleep study results I suggested she return for a full night CPAP titration study. She had this on 05/24/2016. Sleep efficiency was 95.7%, sleep latency was 24 minutes, REM latency was 67 minutes. She had an increased percentage of light stage sleep, slow-wave sleep was absent, REM sleep was 18.6%. She had no significant PLMS, average oxygen saturation was 96%, nadir was 90%. She was fitted with a small nasal mask. CPAP was titrated from 5 cm to 7 cm. On Caroline Caroline [name] final pressure she had an AHI of 0 per hour with supine REM sleep achieved an O2 nadir of 90%.  Today, 07/29/2016 (all dictated new, as well as above notes, some dictation done in  note pad or Word, outside of chart, may appear as copied):   I reviewed her CPAP compliance data from 06/28/2016 through 07/27/2016, which is a total of 30 days, during which time she used her CPAP every night with percent used days greater than 4 hours at 100%, indicating superb compliance with an average usage of 7 hours and 41 minutes, residual AHI low at 0.6 per hour, leak acceptable with Caroline Caroline [name] 95th percentile at 17.9 L/m on a pressure of 7 cm with EPR of 2. She reports feeling better rested, better quality, no longer any nocturia. She recently saw Dr. Leonie [name] on 07/02/2016 for stroke follow-up. I reviewed Caroline Caroline [name] office note.  She reports other than some marks on her face from Caroline Caroline [name] nasal mask and mouth dryness she is tolerating Caroline Caroline [name] treatment and feels improved. She often looks forward to sleeping because she feels well rested when she wakes up in Caroline Caroline [name] morning.  Caroline Caroline [name] Caroline [name]'s allergies, current medications, family history, past medical history, past social history, past surgical history and problem list were reviewed and updated as appropriate.   Previously (copied from previous notes for reference):   04/22/2016: She reports snoring and excessive daytime somnolence. I reviewed your office note from 03/10/2016.  She is on metformin for her DM, she has been on Plavix and lipitor since Caroline Caroline [name] stroke. She sees Dr. Karle [name] at [facility] for her PCP.  She has occasional morning headaches, nocturia 1-2 times per night.  She has a implantable loop recorder. Snoring can  be very loud, per husband's feedback to her.  She is back working FT, no residual Sx. Had slurring of speech. She is a nonsmoker, she does drink alcohol. Her MGF had a stroke. She had ST, no need for PT or OT.  She has multiple questions about her stroke and further management.  She snores, worse on her back. BT is 9:30 to 10 PM, WT at 6 AM. ESS is 13/24, FSS is 29/63. Some aching in legs at night. Her husband has obstructive sleep apnea and uses a CPAP.  She has recently resumed driving. She gets fatigued easily but otherwise has no residual symptoms.   Her Past Medical History Is Significant For: Past Medical History:  Diagnosis Date  . CVA (cerebral vascular accident) (Caroline [name])   . Diabetes mellitus without complication ([place])   . Reflux     Her Past Surgical History Is Significant For: Past Surgical History:  Procedure Laterality Date  . ABDOMINAL HYSTERECTOMY    . APPENDECTOMY    . EP IMPLANTABLE DEVICE N/A 01/08/2016   Procedure: Loop Recorder Insertion;  Surgeon: Caroline Lance, MD;  Location: [place] [facility];  Service: Cardiovascular;  Laterality: N/A;  . IR GENERIC HISTORICAL  01/02/2016   IR ANGIO VERTEBRAL SEL VERTEBRAL UNI L MOD SED 01/02/2016 Caroline Bras, MD [facility]  . IR GENERIC HISTORICAL  01/02/2016   IR ANGIO INTRA EXTRACRAN SEL COM CAROTID INNOMINATE BILAT MOD SED 01/02/2016 Caroline Bras, MD [facility]  . IR GENERIC HISTORICAL  01/02/2016   IR ANGIO VERTEBRAL SEL SUBCLAVIAN INNOMINATE UNI R MOD SED 01/02/2016 Caroline Bras, MD [facility]  . left cyst from breast remove    . RADIOLOGY WITH ANESTHESIA N/A 01/02/2016   Procedure: RADIOLOGY WITH ANESTHESIA;  Surgeon: Caroline Caroline [name] Radiologist, MD;  Location: [place];  Service: Radiology;  Laterality: N/A;  . TEE WITHOUT CARDIOVERSION N/A 01/08/2016   Procedure: TRANSESOPHAGEAL ECHOCARDIOGRAM (TEE);  Surgeon: Caroline Dresser, MD;  Location: Caroline Caroline [name] [facility];  Service: Cardiovascular;  Laterality: N/A;    Her Family History Is Significant For: Family History  Problem Relation Age of Onset  . Atrial fibrillation Mother   . Suicidality Father     63  . Stroke Maternal Grandfather     Her Social History Is Significant For: Social History   Social History  . Marital status: Married    Spouse name: N/A  . Number of children: 2  . Years of education: HS   Occupational History  . [employer]    Social History Main Topics  . Smoking status: Never  Smoker  . Smokeless tobacco: Never Used  . Alcohol use No  . Drug use: No  . Sexual activity: Not Asked   Other Topics Concern  . None   Social History Narrative   Lives at home with her husband.   Left-handed   Occasional caffeine use.       Her Allergies Are:  Allergies  Allergen Reactions  . Amoxicillin Rash  . Azithromycin Rash  . Homatropine Rash  . Hydrocodone-Homatropine Rash  . Penicillin G Rash    Dyspepsia, ingestion  . Tape Rash  :   Her Current Medications Are:  Outpatient Encounter Prescriptions as of 07/29/2016  Caroline Caroline [name] Sig  . atorvastatin (LIPITOR) 10 MG tablet Take 1 tablet (10 mg total) by mouth daily at 6 PM.  . cetirizine (ZYRTEC) 5 MG tablet Take 5 mg by mouth daily.  . clopidogrel (PLAVIX) 75 MG tablet Take 1 tablet (75 mg total)  by mouth daily.  Marland Kitchen FIBER PO Take by mouth daily.  Marland Kitchen losartan (COZAAR) 25 MG tablet Take 25 mg by mouth daily. as directed  . pantoprazole (PROTONIX) 40 MG tablet Take 40 mg by mouth daily.  . psyllium (REGULOID) 0.52 g capsule Take 0.52 g by mouth daily.  . sitaGLIPtin-metformin (JANUMET) 50-500 MG tablet Take by mouth.  . valACYclovir (VALTREX) 500 MG tablet Take 500 mg by mouth daily.    No facility-administered encounter medications on file as of 07/29/2016.   :  Review of Systems:  Out of a complete 14 point review of systems, all are reviewed and negative with Caroline Caroline [name] exception of these symptoms as listed below:  Review of Systems  Neurological:       Pt presents today to discuss her cpap. Pt has broken out on her face once from Caroline Caroline [name] mask and also has noticed creases on her face from Caroline Caroline [name] mask. However, she does report feeling better during Caroline Caroline [name] day after using Caroline Caroline [name] cpap.    Objective:  Neurologic Exam  Physical Exam Physical Examination:   Vitals:   07/29/16 1505  BP: (!) 142/81  Pulse: 63  Resp: 20   General Examination: Caroline Caroline [name] Caroline [name] is a very pleasant 61 y.o. female in no acute distress. She appears  well-developed and well-nourished and well groomed. Good spirits today.  HEENT: Normocephalic, atraumatic, pupils are equal, round and reactive to light and accommodation. Extraocular tracking is good without limitation to gaze excursion or nystagmus noted. Normal smooth pursuit is noted. Hearing is grossly intact. Face is symmetric with normal facial animation and normal facial sensation. Speech is clear with no dysarthria noted. There is no hypophonia. There is no lip, neck/head, jaw or voice tremor. Neck is supple with full range of passive and active motion. There are no carotid bruits on auscultation. Oropharynx exam reveals: mild mouth dryness, adequate dental hygiene and mild airway crowding. Mallampati is class II. Tongue protrudes centrally and palate elevates symmetrically. Tonsils are absent.   Chest: Clear to auscultation without wheezing, rhonchi or crackles noted.  Heart: S1+S2+0, regular and normal without murmurs, rubs or gallops noted.   Abdomen: Soft, non-tender and non-distended with normal bowel sounds appreciated on auscultation.  Extremities: There is no pitting edema in Caroline Caroline [name] distal lower extremities bilaterally. Pedal pulses are intact.  Skin: Warm and dry without trophic changes noted.  Musculoskeletal: exam reveals no obvious joint deformities, tenderness or joint swelling or erythema.   Neurologically:  Mental status: Caroline Caroline [name] Caroline [name] is awake, alert and oriented in all 4 spheres. Her immediate and remote memory, attention, language skills and fund of knowledge are appropriate. There is no evidence of aphasia, agnosia, apraxia or anomia. Speech is clear with normal prosody and enunciation. Thought process is linear. Mood is normal and affect is normal.  Cranial nerves II - XII are as described above under HEENT exam. In addition: shoulder shrug is normal with equal shoulder height noted. Motor exam: Normal bulk, strength and tone is noted. There is no drift, tremor or rebound.  Romberg is negative. Reflexes are 1-2+. Fine motor skills and coordination: intact with normal finger taps, normal hand movements, normal rapid alternating patting, normal foot taps and normal foot agility.  Cerebellar testing: No dysmetria or intention tremor on finger to nose testing. There is no truncal or gait ataxia.  Sensory exam: intact to light touch in Caroline Caroline [name] upper and lower extremities.  Gait, station and balance: She stands easily. No veering to one side is noted. No leaning  to one side is noted. Posture is age-appropriate and stance is narrow based. Gait shows normal stride length and normal pace. No problems turning are noted.  Tandem walk is unremarkable.               Assessment and Plan:  In summary, Caroline Caroline [name] Caroline [name] is a very pleasant 61 y.o.-year old female with an underlying medical history of type 2 diabetes, left MCA stroke in September 2017 without significant residual findings, hypertension, hyperlipidemia, overweight state, who returns for follow-up consultation of her recent diagnosis of OSA. She had sleep study testing in January 2018 and February 2018. She has mild to moderate obstructive sleep apnea and has established treatment with CPAP at 7 cm via nasal mask. She is fully compliant with treatment and endorses improvement in her sleep consolidation, sleep quality, daytime somnolence and nocturia. She has a stable physical exam. We talked about her sleep study results in detail today, also reviewed her compliance data together. She is commended for her treatment adherence. For mouth dryness, she is advised to stay well-hydrated and also keep some water at her night stand. In addition, she can perhaps turn up Caroline Caroline [name] humidity setting on Caroline Caroline [name] CPAP machine. She is advised to follow-up routinely for brief checkup with one of our nurse practitioners in 6 months, I will see her back yearly after that. I answered all her questions today and she was in agreement.  I spent 25 minutes in total  face-to-face time with Caroline Caroline [name] Caroline [name], more than 50% of which was spent in counseling and coordination of care, reviewing test results, reviewing Caroline Caroline [name] and discussing or reviewing Caroline Caroline [name] diagnosis of OSA, its prognosis and treatment options. Pertinent laboratory and imaging test results that were available during this visit with Caroline Caroline [name] Caroline [name] were reviewed by me and considered in my medical decision making (see chart for details).

## 2016-08-05 ENCOUNTER — Ambulatory Visit (INDEPENDENT_AMBULATORY_CARE_PROVIDER_SITE_OTHER): Payer: BLUE CROSS/BLUE SHIELD | Admitting: *Deleted

## 2016-08-05 DIAGNOSIS — I639 Cerebral infarction, unspecified: Secondary | ICD-10-CM

## 2016-08-06 NOTE — Progress Notes (Signed)
Carelink Summary Report / Loop Recorder 

## 2016-08-15 LAB — CUP PACEART REMOTE DEVICE CHECK
Date Time Interrogation Session: 20180418220800
MDC IDC PG IMPLANT DT: 20170920

## 2016-09-04 ENCOUNTER — Ambulatory Visit (INDEPENDENT_AMBULATORY_CARE_PROVIDER_SITE_OTHER): Payer: BLUE CROSS/BLUE SHIELD | Admitting: *Deleted

## 2016-09-04 DIAGNOSIS — I639 Cerebral infarction, unspecified: Secondary | ICD-10-CM | POA: Diagnosis not present

## 2016-09-07 NOTE — Progress Notes (Signed)
Carelink Summary Report / Loop Recorder 

## 2016-09-14 LAB — CUP PACEART REMOTE DEVICE CHECK
Date Time Interrogation Session: 20180518221419
MDC IDC PG IMPLANT DT: 20170920

## 2016-10-05 ENCOUNTER — Ambulatory Visit (INDEPENDENT_AMBULATORY_CARE_PROVIDER_SITE_OTHER): Payer: BLUE CROSS/BLUE SHIELD | Admitting: *Deleted

## 2016-10-05 DIAGNOSIS — I639 Cerebral infarction, unspecified: Secondary | ICD-10-CM | POA: Diagnosis not present

## 2016-10-05 NOTE — Progress Notes (Signed)
Carelink Summary Report / Loop Recorder 

## 2016-10-13 LAB — CUP PACEART REMOTE DEVICE CHECK
Implantable Pulse Generator Implant Date: 20170920
MDC IDC SESS DTM: 20180617224006

## 2016-11-03 ENCOUNTER — Ambulatory Visit (INDEPENDENT_AMBULATORY_CARE_PROVIDER_SITE_OTHER): Payer: BLUE CROSS/BLUE SHIELD | Admitting: *Deleted

## 2016-11-03 DIAGNOSIS — I639 Cerebral infarction, unspecified: Secondary | ICD-10-CM | POA: Diagnosis not present

## 2016-11-04 NOTE — Progress Notes (Signed)
Carelink Summary Report / Loop Recorder 

## 2016-11-16 LAB — CUP PACEART REMOTE DEVICE CHECK
MDC IDC PG IMPLANT DT: 20170920
MDC IDC SESS DTM: 20180717234757

## 2016-11-16 NOTE — Progress Notes (Signed)
Carelink summary report received. Battery status OK. Normal device function. No new symptom episodes, tachy episodes, brady, or pause episodes. No new AF episodes. Monthly summary reports and ROV/PRN 

## 2016-12-03 ENCOUNTER — Ambulatory Visit (INDEPENDENT_AMBULATORY_CARE_PROVIDER_SITE_OTHER): Payer: BLUE CROSS/BLUE SHIELD | Admitting: *Deleted

## 2016-12-03 DIAGNOSIS — I639 Cerebral infarction, unspecified: Secondary | ICD-10-CM | POA: Diagnosis not present

## 2016-12-04 NOTE — Progress Notes (Signed)
Loop recorder summary report 

## 2016-12-11 LAB — CUP PACEART REMOTE DEVICE CHECK
Date Time Interrogation Session: 20180817000959
Implantable Pulse Generator Implant Date: 20170920

## 2017-01-04 ENCOUNTER — Ambulatory Visit (INDEPENDENT_AMBULATORY_CARE_PROVIDER_SITE_OTHER): Payer: BLUE CROSS/BLUE SHIELD | Admitting: *Deleted

## 2017-01-04 DIAGNOSIS — I639 Cerebral infarction, unspecified: Secondary | ICD-10-CM

## 2017-01-04 NOTE — Progress Notes (Signed)
Carelink Summary Report / Loop Recorder 

## 2017-01-07 LAB — CUP PACEART REMOTE DEVICE CHECK
Implantable Pulse Generator Implant Date: 20170920
MDC IDC SESS DTM: 20180916124142

## 2017-02-01 ENCOUNTER — Ambulatory Visit: Payer: BLUE CROSS/BLUE SHIELD | Admitting: Adult Health

## 2017-02-01 NOTE — Telephone Encounter (Signed)
Close Encounter 

## 2017-02-02 ENCOUNTER — Ambulatory Visit (INDEPENDENT_AMBULATORY_CARE_PROVIDER_SITE_OTHER): Payer: BLUE CROSS/BLUE SHIELD | Admitting: *Deleted

## 2017-02-02 DIAGNOSIS — I639 Cerebral infarction, unspecified: Secondary | ICD-10-CM

## 2017-02-02 NOTE — Progress Notes (Signed)
Carelink Summary Report / Loop Recorder 

## 2017-02-05 LAB — CUP PACEART REMOTE DEVICE CHECK
Implantable Pulse Generator Implant Date: 20170920
MDC IDC SESS DTM: 20181016141416

## 2017-03-04 ENCOUNTER — Ambulatory Visit (INDEPENDENT_AMBULATORY_CARE_PROVIDER_SITE_OTHER): Payer: BLUE CROSS/BLUE SHIELD | Admitting: *Deleted

## 2017-03-04 DIAGNOSIS — I639 Cerebral infarction, unspecified: Secondary | ICD-10-CM

## 2017-03-05 NOTE — Progress Notes (Signed)
Carelink Summary Report / Loop Recorder 

## 2017-03-22 LAB — CUP PACEART REMOTE DEVICE CHECK
MDC IDC PG IMPLANT DT: 20170920
MDC IDC SESS DTM: 20181115151201

## 2017-04-05 ENCOUNTER — Ambulatory Visit (INDEPENDENT_AMBULATORY_CARE_PROVIDER_SITE_OTHER): Payer: 59 | Admitting: *Deleted

## 2017-04-05 DIAGNOSIS — I639 Cerebral infarction, unspecified: Secondary | ICD-10-CM

## 2017-04-05 NOTE — Progress Notes (Signed)
Carelink Summary Report / Loop Recorder 

## 2017-04-20 ENCOUNTER — Encounter: Payer: Self-pay | Admitting: Adult Health

## 2017-04-21 ENCOUNTER — Ambulatory Visit: Payer: 59 | Admitting: Adult Health

## 2017-04-21 ENCOUNTER — Encounter: Payer: Self-pay | Admitting: Adult Health

## 2017-04-21 VITALS — BP 119/76 | HR 70 | Wt 195.0 lb

## 2017-04-21 DIAGNOSIS — R202 Paresthesia of skin: Secondary | ICD-10-CM | POA: Diagnosis not present

## 2017-04-21 DIAGNOSIS — R200 Anesthesia of skin: Secondary | ICD-10-CM | POA: Diagnosis not present

## 2017-04-21 DIAGNOSIS — Z9989 Dependence on other enabling machines and devices: Secondary | ICD-10-CM

## 2017-04-21 DIAGNOSIS — G4733 Obstructive sleep apnea (adult) (pediatric): Secondary | ICD-10-CM | POA: Diagnosis not present

## 2017-04-21 LAB — CUP PACEART REMOTE DEVICE CHECK
Implantable Pulse Generator Implant Date: 20170920
MDC IDC SESS DTM: 20181215163944

## 2017-04-21 NOTE — Progress Notes (Addendum)
PATIENT: Caroline [name] DOB: April 04, 1956  REASON FOR VISIT: follow up HISTORY FROM: patient  HISTORY OF PRESENT ILLNESS: Today 04/21/17 Caroline [name] is a 62 year old female with a history of obstructive sleep apnea on CPAP.  She returns today for complaint family.  Her download indicates that she use her machine 30 out of 30 days for compliance of 100%.  She used her machine greater than 4 hours 29 out of 30 days for compliance of 97%.  On average she uses her machine 7 hours and 18 minutes.  Her residual AHI is 0.4 on 7 cm of water with EPR 2.  She has a leak in the 95th percentile 21.4 L/min.  She reports that she did have her mask refitted but she still feels that the mask is too big for her face.  She also reports that she is been having intermittent tingling in the left hand.  She reports it can occur with activity and at rest.  She denies any specific pattern.  She reports that in the past she had an epidural steroid injection in the cervical spine.  She is unsure if this is a spinal issue but is concerned due to her history of stroke.  She returns today for an evaluation.  HISTORY Caroline [name] is a 62 year old left-handed woman with an underlying medical history of type 2 diabetes, recent stroke in September 2017 of the left MCA, hypertension, hyperlipidemia and overweight state, who presents for follow-up consultation of her obstructive sleep apnea, after recent sleep study testing. The patient is unaccompanied today. I first met her on 04/22/2016 at the request of Dr. Jaynee Eagles, at which time she reported snoring and excessive daytime somnolence. She was invited for a sleep study testing. She had a baseline sleep study, followed by a CPAP titration study. I went over her test results in detail with her today. Her baseline sleep study from 05/03/2016 showed a sleep efficiency of 83.1%, sleep latency was prolonged at 52 minutes, REM latency was 60 minutes. She had an increased percentage of stage II  sleep, a very small amount of slow-wave sleep and REM sleep was 21.4%. She had a total AHI of 8.5 per hour, REM AHI was 23.9 per hour, supine AHI was 20 per hour. Average oxygen saturation was 84%, nadir was 79%. Time below 89% saturation was 26 minutes. She had no significant PLMS. Based on her sleep-related complaints, her medical history including stroke, and her sleep study results I suggested she return for a full night CPAP titration study. She had this on 05/24/2016. Sleep efficiency was 95.7%, sleep latency was 24 minutes, REM latency was 67 minutes. She had an increased percentage of light stage sleep, slow-wave sleep was absent, REM sleep was 18.6%. She had no significant PLMS, average oxygen saturation was 96%, nadir was 90%. She was fitted with a small nasal mask. CPAP was titrated from 5 cm to 7 cm. On the final pressure she had an AHI of 0 per hour with supine REM sleep achieved an O2 nadir of 90%.   07/29/2016  I reviewed her CPAP compliance data from 06/28/2016 through 07/27/2016, which is a total of 30 days, during which time she used her CPAP every night with percent used days greater than 4 hours at 100%, indicating superb compliance with an average usage of 7 hours and 41 minutes, residual AHI low at 0.6 per hour, leak acceptable with the 95th percentile at 17.9 L/m on a pressure of 7 cm with EPR  of 2. She reports feeling better rested, better quality, no longer any nocturia. She recently saw Dr. Leonie Man on 07/02/2016 for stroke follow-up. I reviewed the office note.  She reports other than some marks on her face from the nasal mask and mouth dryness she is tolerating the treatment and feels improved. She often looks forward to sleeping because she feels well rested when she wakes up in the morning.  The patient's allergies, current medications, family history, past medical history, past social history, past surgical history and problem list were reviewed and updated as appropriate.    REVIEW OF SYSTEMS: Out of a complete 14 system review of symptoms, the patient complains only of the following symptoms, and all other reviewed systems are negative.  See HPI  ALLERGIES: Allergies  Allergen Reactions  . Amoxicillin Rash  . Azithromycin Rash  . Homatropine Rash  . Hydrocodone-Homatropine Rash  . Penicillin G Rash    Dyspepsia, ingestion  . Tape Rash    HOME MEDICATIONS: Outpatient Medications Prior to Visit  Medication Sig Dispense Refill  . atorvastatin (LIPITOR) 10 MG tablet Take 1 tablet (10 mg total) by mouth daily at 6 PM. 30 tablet 3  . cetirizine (ZYRTEC) 5 MG tablet Take 5 mg by mouth daily.    . clopidogrel (PLAVIX) 75 MG tablet Take 1 tablet (75 mg total) by mouth daily. 30 tablet 3  . FIBER PO Take by mouth daily.    Marland Kitchen losartan (COZAAR) 25 MG tablet Take 25 mg by mouth daily. as directed  0  . pantoprazole (PROTONIX) 40 MG tablet Take 40 mg by mouth daily.    . psyllium (REGULOID) 0.52 g capsule Take 0.52 g by mouth daily.    . sitaGLIPtin-metformin (JANUMET) 50-500 MG tablet Take by mouth.    . valACYclovir (VALTREX) 500 MG tablet Take 500 mg by mouth daily.      No facility-administered medications prior to visit.     PAST MEDICAL HISTORY: Past Medical History:  Diagnosis Date  . CVA (cerebral vascular accident) ([place])   . Diabetes mellitus without complication ([place])   . Reflux     PAST SURGICAL HISTORY: Past Surgical History:  Procedure Laterality Date  . ABDOMINAL HYSTERECTOMY    . APPENDECTOMY    . EP IMPLANTABLE DEVICE N/A 01/08/2016   Procedure: Loop Recorder Insertion;  Surgeon: Evans Lance, MD;  Location: [facility];  Service: Cardiovascular;  Laterality: N/A;  . IR GENERIC HISTORICAL  01/02/2016   IR ANGIO VERTEBRAL SEL VERTEBRAL UNI L MOD SED 01/02/2016 Luanne Bras, MD [facility]  . IR GENERIC HISTORICAL  01/02/2016   IR ANGIO INTRA EXTRACRAN SEL COM CAROTID INNOMINATE BILAT MOD SED 01/02/2016 Luanne Bras,  MD [facility]  . IR GENERIC HISTORICAL  01/02/2016   IR ANGIO VERTEBRAL SEL SUBCLAVIAN INNOMINATE UNI R MOD SED 01/02/2016 Luanne Bras, MD [facility]  . left cyst from breast remove    . RADIOLOGY WITH ANESTHESIA N/A 01/02/2016   Procedure: RADIOLOGY WITH ANESTHESIA;  Surgeon: Medication Radiologist, MD;  Location: [place];  Service: Radiology;  Laterality: N/A;  . TEE WITHOUT CARDIOVERSION N/A 01/08/2016   Procedure: TRANSESOPHAGEAL ECHOCARDIOGRAM (TEE);  Surgeon: Larey Dresser, MD;  Location: [facility];  Service: Cardiovascular;  Laterality: N/A;    FAMILY HISTORY: Family History  Problem Relation Age of Onset  . Atrial fibrillation Mother   . Suicidality Father        20  . Stroke Maternal Grandfather  SOCIAL HISTORY: Social History   Socioeconomic History  . Marital status: Married    Spouse name: Not on file  . Number of children: 2  . Years of education: HS  . Highest education level: Not on file  Social Needs  . Financial resource strain: Not on file  . Food insecurity - worry: Not on file  . Food insecurity - inability: Not on file  . Transportation needs - medical: Not on file  . Transportation needs - non-medical: Not on file  Occupational History  . Occupation: [employer]  Tobacco Use  . Smoking status: Never Smoker  . Smokeless tobacco: Never Used  Substance and Sexual Activity  . Alcohol use: No  . Drug use: No  . Sexual activity: Not on file  Other Topics Concern  . Not on file  Social History Narrative   Lives at home with her husband.   Left-handed   Occasional caffeine use.      PHYSICAL EXAM  Vitals:   04/21/17 1451  BP: 119/76  Pulse: 70  Weight: 195 lb (88.5 kg)   Body mass index is 34.54 kg/m.  Generalized: Well developed, in no acute distress   Neurological examination  Mentation: Alert oriented to time, place, history taking. Follows all commands speech and language fluent Cranial nerve II-XII: Pupils were  equal round reactive to light. Extraocular movements were full, visual field were full on confrontational test. Facial sensation and strength were normal. Uvula tongue midline. Head turning and shoulder shrug  were normal and symmetric. Motor: The motor testing reveals 5 over 5 strength of all 4 extremities. Good symmetric motor tone is noted throughout.  Sensory: Sensory testing is intact to soft touch on all 4 extremities. No evidence of extinction is noted.  Coordination: Cerebellar testing reveals good finger-nose-finger and heel-to-shin bilaterally.  Gait and station: Gait is normal. Tandem gait is normal. Romberg is negative. No drift is seen.  Reflexes: Deep tendon reflexes are symmetric and normal bilaterally.   DIAGNOSTIC DATA (LABS, IMAGING, TESTING) - I reviewed patient records, labs, notes, testing and imaging myself where available.  Lab Results  Component Value Date   WBC 7.2 01/02/2016   HGB 16.3 (H) 01/02/2016   HCT 48.0 (H) 01/02/2016   MCV 97.4 01/02/2016   PLT 169 01/02/2016      Component Value Date/Time   NA 138 01/02/2016 1156   K 4.0 01/02/2016 1156   CL 103 01/02/2016 1156   CO2 22 01/02/2016 1152   GLUCOSE 154 (H) 01/02/2016 1156   BUN 10 01/02/2016 1156   CREATININE 0.90 01/02/2016 1156   CALCIUM 9.2 01/02/2016 1152   PROT 7.0 01/02/2016 1152   ALBUMIN 3.8 01/02/2016 1152   AST 27 01/02/2016 1152   ALT 30 01/02/2016 1152   ALKPHOS 63 01/02/2016 1152   BILITOT 0.8 01/02/2016 1152   GFRNONAA >60 01/02/2016 1152   GFRAA >60 01/02/2016 1152   Lab Results  Component Value Date   CHOL 136 01/03/2016   HDL 35 (L) 01/03/2016   LDLCALC 68 01/03/2016   TRIG 163 (H) 01/03/2016   CHOLHDL 3.9 01/03/2016   Lab Results  Component Value Date   HGBA1C 6.7 (H) 01/03/2016     ASSESSMENT AND PLAN 62 y.o. year old female  has a past medical history of CVA (cerebral vascular accident) ([place]), Diabetes mellitus without complication ([place]), and Reflux. here  with:  1.  Obstructive sleep apnea on CPAP 2.  Tingling left hand  The patient CPAP  download shows excellent compliance and good treatment of her apnea.  She does still have issues with her mask.  I will order a mask refitting with her DME company.  She is also reporting tingling in the left hand that is intermittent.  She does report cervical radiculopathy and in the past  had an ESI.  Advised the patient that this may need to be followed up on by that physician.  I did advise that if she develops strokelike symptoms she should call 911.  She does have a follow-up appointment with Dr. Leonie Man in March.    Ward Givens, MSN, NP-C 04/21/2017, 12:02 PM [facility] [address], [place], [place] [address] [phone]  I reviewed the above note and documentation by the Nurse Practitioner and agree with the history, physical exam, assessment and plan as outlined above. I was immediately available for face-to-face consultation. Star Age, MD, PhD [facility])

## 2017-04-21 NOTE — Patient Instructions (Addendum)
Your Plan:  Continue using CPAP nightly.  Download shows excellent compliance and good treatment of apnea. Mask refitting If your symptoms worsen or you develop new symptoms please let us know.    Please call [employer] at [phone], and press option 1 when prompted. Their customer service representatives will be glad to assist you. If they are unable to answer, please leave a message and they will call you back. Make sure to leave your name and return phone number.  Thank you for coming to see us at [facility]. I hope we have been able to provide you high quality care today.  You may receive a patient satisfaction survey over the next few weeks. We would appreciate your feedback and comments so that we may continue to improve ourselves and the health of our patients.

## 2017-04-21 NOTE — Progress Notes (Addendum)
DME order successfully faxed to [employer], re: mask. 

## 2017-05-03 ENCOUNTER — Ambulatory Visit (INDEPENDENT_AMBULATORY_CARE_PROVIDER_SITE_OTHER): Payer: 59 | Admitting: *Deleted

## 2017-05-03 DIAGNOSIS — I639 Cerebral infarction, unspecified: Secondary | ICD-10-CM

## 2017-05-05 NOTE — Progress Notes (Signed)
Carelink Summary Report / Loop Recorder 

## 2017-05-11 LAB — CUP PACEART REMOTE DEVICE CHECK
MDC IDC PG IMPLANT DT: 20170920
MDC IDC SESS DTM: 20190114184152

## 2017-06-02 ENCOUNTER — Ambulatory Visit (INDEPENDENT_AMBULATORY_CARE_PROVIDER_SITE_OTHER): Payer: 59 | Admitting: *Deleted

## 2017-06-02 DIAGNOSIS — I639 Cerebral infarction, unspecified: Secondary | ICD-10-CM

## 2017-06-03 NOTE — Progress Notes (Signed)
Carelink Summary Report / Loop Recorder 

## 2017-06-12 ENCOUNTER — Other Ambulatory Visit: Payer: Self-pay

## 2017-06-12 ENCOUNTER — Encounter (HOSPITAL_COMMUNITY): Payer: Self-pay | Admitting: Emergency Medicine

## 2017-06-12 ENCOUNTER — Emergency Department (HOSPITAL_COMMUNITY)
Admission: EM | Admit: 2017-06-12 | Discharge: 2017-06-12 | Disposition: A | Discharge status: Home / Self Care | Payer: 59 | Attending: Emergency Medicine | Admitting: Emergency Medicine

## 2017-06-12 ENCOUNTER — Emergency Department (HOSPITAL_BASED_OUTPATIENT_CLINIC_OR_DEPARTMENT_OTHER): Admit: 2017-06-12 | Discharge: 2017-06-12 | Disposition: A | Discharge status: Home / Self Care | Payer: 59

## 2017-06-12 DIAGNOSIS — R600 Localized edema: Secondary | ICD-10-CM | POA: Insufficient documentation

## 2017-06-12 DIAGNOSIS — Z7902 Long term (current) use of antithrombotics/antiplatelets: Secondary | ICD-10-CM | POA: Insufficient documentation

## 2017-06-12 DIAGNOSIS — M79661 Pain in right lower leg: Secondary | ICD-10-CM | POA: Diagnosis present

## 2017-06-12 DIAGNOSIS — M79609 Pain in unspecified limb: Secondary | ICD-10-CM

## 2017-06-12 DIAGNOSIS — Z7984 Long term (current) use of oral hypoglycemic drugs: Secondary | ICD-10-CM | POA: Diagnosis not present

## 2017-06-12 DIAGNOSIS — I824Z3 Acute embolism and thrombosis of unspecified deep veins of distal lower extremity, bilateral: Secondary | ICD-10-CM | POA: Diagnosis not present

## 2017-06-12 DIAGNOSIS — Z79899 Other long term (current) drug therapy: Secondary | ICD-10-CM | POA: Insufficient documentation

## 2017-06-12 DIAGNOSIS — Z8673 Personal history of transient ischemic attack (TIA), and cerebral infarction without residual deficits: Secondary | ICD-10-CM | POA: Insufficient documentation

## 2017-06-12 DIAGNOSIS — M7989 Other specified soft tissue disorders: Secondary | ICD-10-CM

## 2017-06-12 DIAGNOSIS — E119 Type 2 diabetes mellitus without complications: Secondary | ICD-10-CM | POA: Insufficient documentation

## 2017-06-12 LAB — CBG MONITORING, ED: Glucose-Capillary: 145 mg/dL — ABNORMAL HIGH (ref 65–99)

## 2017-06-12 MED ORDER — ACETAMINOPHEN 500 MG PO TABS
1000.0000 mg | ORAL_TABLET | Freq: Once | ORAL | Status: AC
Start: 2017-06-12 — End: 2017-06-12
  Administered 2017-06-12: 1000 mg via ORAL
  Filled 2017-06-12: qty 2

## 2017-06-12 MED ORDER — APIXABAN 5 MG PO TABS: 5.0000 mg | ORAL_TABLET | Freq: Two times a day (BID) | ORAL | Status: DC

## 2017-06-12 MED ORDER — APIXABAN 5 MG PO TABS: ORAL_TABLET | ORAL | 0 refills | Status: DC

## 2017-06-12 MED ORDER — APIXABAN (ELIQUIS) EDUCATION KIT FOR DVT/PE PATIENTS
PACK | Freq: Once | Status: AC
  Administered 2017-06-12: 22:00:00
  Filled 2017-06-12: qty 1

## 2017-06-12 MED ORDER — APIXABAN 5 MG PO TABS
10.0000 mg | ORAL_TABLET | Freq: Two times a day (BID) | ORAL | Status: DC
  Administered 2017-06-12: 10 mg via ORAL
  Filled 2017-06-12 (×2): qty 2

## 2017-06-12 NOTE — Progress Notes (Signed)
ANTICOAGULATION CONSULT NOTE - Initial Consult  Pharmacy Consult for  Apixaban Indication: VTE treatment  Acute DVT  RLE  Allergies  Allergen Reactions  . Amoxicillin Rash  . Azithromycin Rash  . Homatropine Rash  . Hydrocodone-Homatropine Rash  . Penicillin G Rash    Dyspepsia, ingestion  . Tape Rash    Patient Measurements: Height: 5\' 2"  (157.5 cm) Weight: 189 lb (85.7 kg) IBW/kg (Calculated) : 50.1   Vital Signs: Temp: 98.6 F (37 C) (02/23 1532) Temp Source: Oral (02/23 1532) BP: 112/61 (02/23 1532) Pulse Rate: 80 (02/23 1532)  Labs: No results for input(s): HGB, HCT, PLT, APTT, LABPROT, INR, HEPARINUNFRC, HEPRLOWMOCWT, CREATININE, CKTOTAL, CKMB, TROPONINI in the last 72 hours.  CrCl cannot be calculated (Patient's most recent lab result is older than the maximum 21 days allowed.).   Medical History: Past Medical History:  Diagnosis Date  . CVA (cerebral vascular accident) (HCC)   . Diabetes mellitus without complication (HCC)   . OSA on CPAP   . Reflux    Assessment: 62 y.o female presents to ED for assessment of right calf pain starting upon waking this morning. Denies injury. Pain is to the right, rear of leg, lower. No swelling, redness or warmth noted.  2/23: RLE venous duplex preliminary: acut DVT in the right posterior tibial and peroneal veins. Unable to adequately visualize the right popliteal vein.  Goal of Therapy:  Monitor platelets by anticoagulation protocol: Yes   Plan:  Apixaban 10mg   BID x 7 days then 5mg  bid.  Will educate patient.  Noah Delaineuth Anahit Klumb, RPh Clinical Pharmacist Pager: [phone] 06/12/2017,8:21 PM

## 2017-06-12 NOTE — Discharge Instructions (Signed)
Take eliquis as prescribed. Take 10 mg 2 times a day for 7 days, and 5 mg 2 times a day after that.  Follow up with your primary care doctor next week for further evaluation and management.  You may use compression socks/ted hose for leg swelling.  You may take 1000 mg of tylenol 3 times a day for pain.  Return to the ER immediately if you develop chest pain, shortness of breath, or difficulty breathing. Return if you have any new or concerning symptoms

## 2017-06-12 NOTE — ED Notes (Signed)
Pt understood dc material. NAD noted. Eliquis starter pack reviewed and given at dc

## 2017-06-12 NOTE — Progress Notes (Signed)
VASCULAR LAB PRELIMINARY  PRELIMINARY  PRELIMINARY  PRELIMINARY  Right lower extremity venous duplex completed.    Preliminary report:  There is acute DVT noted in the right posterior tibial and peroneal veins. Unable to adequately visualize the right popliteal vein.  [facility]  Jolleen Seman, RVT 06/12/2017, 7:27 PM

## 2017-06-12 NOTE — ED Triage Notes (Signed)
US Tech at bedside.

## 2017-06-12 NOTE — ED Triage Notes (Signed)
Vascular Tech contacted  For doppler.

## 2017-06-12 NOTE — ED Provider Notes (Signed)
[facility] EMERGENCY DEPARTMENT Provider Note   CSN: [id number] Arrival date & time: 06/12/17  1329     History   Chief Complaint Chief Complaint  Patient presents with  . Leg Pain    HPI Caroline [name] is a 62 y.o. female presenting for evaluation of R lower leg pain.   Pt states that she developed posteior R lower leg pain today. There was no injury or trauma.  Pain is described as a cramping, starting in her calf extending up her leg.  She denies history of similar.  She states she had a stroke in 2017, presumed to be caused by a blood clot.  She has been on Plavix since.  No other blood thinners.  She denies recent travel, surgeries, immobilization.  She denies history of cancer.  She used to be on estrogen, but stopped this after the stroke.  She has not had anything for pain including Tylenol.  She denies fevers, chills, chest pain, shortness of breath, nausea, vomiting, abdominal pain, urinary symptoms, abnormal bowel movements.  She denies significant leg swelling.  She denies redness or warmth of the skin.  Pain is worse with movement.  HPI  Past Medical History:  Diagnosis Date  . CVA (cerebral vascular accident) ([place])   . Diabetes mellitus without complication ([place])   . OSA on CPAP   . Reflux     Patient Active Problem List   Diagnosis Date Noted  . 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 Stroke (cerebrum) ([place]) 01/02/2016    Past Surgical History:  Procedure Laterality Date  . ABDOMINAL HYSTERECTOMY    . APPENDECTOMY    . EP IMPLANTABLE DEVICE N/A 01/08/2016   Procedure: Loop Recorder Insertion;  Surgeon: Evans Lance, MD;  Location: [facility];  Service: Cardiovascular;  Laterality: N/A;  . IR GENERIC HISTORICAL  01/02/2016   IR ANGIO VERTEBRAL SEL VERTEBRAL UNI L MOD SED 01/02/2016 Luanne Bras, MD [facility]  . IR GENERIC HISTORICAL  01/02/2016   IR ANGIO INTRA EXTRACRAN SEL COM CAROTID INNOMINATE BILAT MOD SED  01/02/2016 Luanne Bras, MD [facility]  . IR GENERIC HISTORICAL  01/02/2016   IR ANGIO VERTEBRAL SEL SUBCLAVIAN INNOMINATE UNI R MOD SED 01/02/2016 Luanne Bras, MD [facility]  . left cyst from breast remove    . RADIOLOGY WITH ANESTHESIA N/A 01/02/2016   Procedure: RADIOLOGY WITH ANESTHESIA;  Surgeon: Medication Radiologist, MD;  Location: [facility];  Service: Radiology;  Laterality: N/A;  . TEE WITHOUT CARDIOVERSION N/A 01/08/2016   Procedure: TRANSESOPHAGEAL ECHOCARDIOGRAM (TEE);  Surgeon: Larey Dresser, MD;  Location: [facility];  Service: Cardiovascular;  Laterality: N/A;    OB History    No data available       Home Medications    Prior to Admission medications   Medication Sig Start Date End Date Taking? Authorizing Provider  atorvastatin (LIPITOR) 10 MG tablet Take 1 tablet (10 mg total) by mouth daily at 6 PM. 01/08/16  Yes Rinehuls, Early Chars, PA-C  clopidogrel (PLAVIX) 75 MG tablet Take 1 tablet (75 mg total) by mouth daily. 01/09/16  Yes Rinehuls, Early Chars, PA-C  FIBER PO Take by mouth daily.   Yes [provider]  fluticasone (FLONASE) 50 MCG/ACT nasal spray Use 2 sprays in each nostril once daily 09/29/16  Yes [provider]  glipiZIDE (GLUCOTROL) 5 MG tablet Take 5 mg by mouth daily after supper.   Yes [provider]  Linagliptin-metFORMIN HCl (JENTADUETO)  2.5-500 MG TABS Take 1 tablet by mouth 2 (two) times daily.   Yes [provider]  losartan (COZAAR) 25 MG tablet Take 25 mg by mouth daily. as directed 03/25/16  Yes [provider]  montelukast (SINGULAIR) 10 MG tablet Take 10 mg by mouth at bedtime.  04/05/17  Yes [provider]  Multiple Vitamin (MULTIVITAMIN WITH MINERALS) TABS tablet Take 1 tablet by mouth daily.   Yes [provider]  pantoprazole (PROTONIX) 40 MG tablet Take 40 mg by mouth daily.   Yes [provider]  valACYclovir (VALTREX) 500 MG tablet Take 500 mg by mouth daily.     Yes [provider]  apixaban (ELIQUIS) 5 MG TABS tablet Take 10 mg (2 tabs) twice a day for 7 days.  Then take 5 mg (1 tab) twice a day. 06/12/17   Yomira Flitton, PA-C    Family History Family History  Problem Relation Age of Onset  . Atrial fibrillation Mother   . Suicidality Father        64  . Stroke Maternal Grandfather     Social History Social History   Tobacco Use  . Smoking status: Never Smoker  . Smokeless tobacco: Never Used  Substance Use Topics  . Alcohol use: No  . Drug use: No     Allergies   Amoxicillin; Azithromycin; Homatropine; Hydrocodone-homatropine; Penicillin g; and Tape   Review of Systems Review of Systems  Musculoskeletal: Positive for myalgias.  All other systems reviewed and are negative.    Physical Exam Updated Vital Signs BP 110/67 (BP Location: Right Arm)   Pulse (!) 58   Temp 98.6 F (37 C) (Oral)   Resp 14   Ht 5' 2" (1.575 m)   Wt 85.7 kg (189 lb)   SpO2 100%   BMI 34.57 kg/m   Physical Exam  Constitutional: She is oriented to person, place, and time. She appears well-developed and well-nourished. No distress.  HENT:  Head: Normocephalic and atraumatic.  Eyes: EOM are normal.  Neck: Normal range of motion.  Cardiovascular: Normal rate, regular rhythm and intact distal pulses.  Pulmonary/Chest: Effort normal and breath sounds normal. No respiratory distress. She has no wheezes.  Abdominal: Soft. She exhibits no distension. There is no tenderness. There is no guarding.  Musculoskeletal: She exhibits edema and tenderness.  Tenderness to palpation of posterior right lower leg.  No obvious warmth.  Leg appears slightly more swollen than the left.  Pedal pulses 1+ bilaterally.  Multiple varicose veins noted. Bilateral feet cool to touch with good and equal cap refill.   Neurological: She is alert and oriented to person, place, and time. No sensory deficit.  Skin: Skin is warm and dry.  Psychiatric: She has a  normal mood and affect.  Nursing note and vitals reviewed.    ED Treatments / Results  Labs (all labs ordered are listed, but only abnormal results are displayed) Labs Reviewed  CBG MONITORING, ED - Abnormal; Notable for the following components:      Result Value   Glucose-Capillary 145 (*)    All other components within normal limits    EKG  EKG Interpretation None       Radiology No results found.  Procedures Procedures (including critical care time)  Medications Ordered in ED Medications  acetaminophen (TYLENOL) tablet 1,000 mg (1,000 mg Oral Given 06/12/17 1742)  apixaban [facility]) Education Kit for DVT/PE patients ( Does not apply Given 06/12/17 2134)     Initial Impression /  Assessment and Plan / ED Course  I have reviewed the triage vital signs and the nursing notes.  Pertinent labs & imaging results that were available during my care of the patient were reviewed by me and considered in my medical decision making (see chart for details).     Pt presenting for evaluation of right calf pain.  This began gradually today.  Physical exam shows patient is neurovascularly intact.  No warmth or redness, doubt cellulitis.  Concern for possible DVT, as patient has had stroke in the past likely due to clot.  DDX also includes claudication, as pain is improved at rest.  Good pedal pulses and cap refill at this time.  Will obtain ultrasound of the legs for further evaluation.  Ultrasound shows bilateral DVTs.  Patient without chest pain or shortness of breath, doubt PE at this time.  No tachycardia.  Consulted with pharmacist, as patient is on Plavix, patient is safe to start a NOAC.  Will start patient on Eliquis.  Starter kit given.  Patient states she will be able to follow-up with primary care next week for further evaluation and management.  Discussed bleeding risks.  At this time, patient appears safe for discharge.  Return precautions given.  Patient states she understands  and agrees plan.  Final Clinical Impressions(s) / ED Diagnoses   Final diagnoses:  DVT, lower extremity, distal, acute, bilateral [facility])    ED Discharge Orders        Ordered    apixaban (ELIQUIS) 5 MG TABS tablet     06/12/17 2111       Franchot Heidelberg, PA-C 06/13/17 0054    Margette Fast, MD 06/13/17 [phone]

## 2017-06-12 NOTE — ED Triage Notes (Signed)
Patient presents to ED for assessment of right calf pain starting upon waking this morning.  Denies injury.  Pain is to the right, rear of leg, lower.  No swelling, redness or warmth noted.

## 2017-07-01 LAB — CUP PACEART REMOTE DEVICE CHECK
Date Time Interrogation Session: 20190213214230
MDC IDC PG IMPLANT DT: 20170920

## 2017-07-05 ENCOUNTER — Ambulatory Visit (INDEPENDENT_AMBULATORY_CARE_PROVIDER_SITE_OTHER): Payer: 59 | Admitting: *Deleted

## 2017-07-05 DIAGNOSIS — I639 Cerebral infarction, unspecified: Secondary | ICD-10-CM | POA: Diagnosis not present

## 2017-07-06 NOTE — Progress Notes (Signed)
Carelink Summary Report / Loop Recorder 

## 2017-07-12 ENCOUNTER — Encounter: Payer: Self-pay | Admitting: Neurology

## 2017-07-12 ENCOUNTER — Ambulatory Visit: Payer: BLUE CROSS/BLUE SHIELD | Admitting: Neurology

## 2017-07-12 VITALS — BP 115/77 | HR 61 | Wt 197.2 lb

## 2017-07-12 DIAGNOSIS — G2581 Restless legs syndrome: Secondary | ICD-10-CM | POA: Diagnosis not present

## 2017-07-12 MED ORDER — TOPIRAMATE 50 MG PO TABS: 50.0000 mg | ORAL_TABLET | Freq: Every day | ORAL | 2 refills | Status: DC

## 2017-07-12 NOTE — Progress Notes (Signed)
[facility] [address] [place]. [place] [address] [phone]       OFFICE FOLLOW-UP NOTE  Ms. 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 She was seen and last visit byDr. Leland Johns   on 02/2116 and subsequently referred to Dr. Frances Furbish for sleep apnoea who did polysomnogram and confirmed sleep apnea. She has since been started on CPAP and is tolerating it well and has noted improvement. Return for follow-up today to see me. She is a complaint by daughter and husband. The patient states her stroke symptoms have improved significantly but she still has some word finding difficulties but equally towards the afternoon and and of the day. This is more prominent when she is tired and exhausted. 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 She states that her mind is not as sharp and it takes her longer to get things done. She does have a remote history of depression in the past but she is currently not on any medications. She states she gets tired easily and does not have the stamina to do a lot of physical work. She is tolerating Plavix well without bleeding or bruising. She states her blood pressure is well controlled today it is [id number]. She is tolerating Lipitor well without muscle aches and pains. She is using CPAP regularly and finds it helpful.. She has a loop recorder in place and so for paroxysmal atrial fibrillation has not yet been found Update 07/12/2017 : She returns for follow-up after last ultrasound on 06/12/17 and started o eliquis. She had lab work done on 07/08/17 ordered by her hematologist which showed negative anti-phospholipid antibodies and beta 2  glycoproteins..she is tolerating eliquis well but also remains on Plavix.She continues to have some word finding difficulties particularly towards the evening time or when she is tired. She admits to being under increased stress due to to her medical issues. She complains of significant pain and discomfort in her left leg at night at times she wakes up in the middle due to discomfort and pain. This may occasionally go up her leg up to her hip. She has history of degenerative cervical spine disease with history of left arm numbness in the past.She denies any new stroke or TIA symptoms. ROS:   14 system review of systems is positive for speech and word finding difficulties. Memory difficulties,  Sleep apnea, numbness, neck pain, reand all other systems negative  PMH:  Past Medical History:  Diagnosis Date  . CVA (cerebral vascular accident) (HCC)   . Diabetes mellitus without complication (HCC)   . OSA on CPAP   . Reflux     Social History:  Social History   Socioeconomic History  . Marital status: Married    Spouse name: Not on file  . Number of children: 2  . Years of education: HS  . Highest education level: Not on file  Occupational History  . Occupation: [employer]  Social Needs  . Financial resource strain: Not on file  . Food insecurity:    Worry: Not on file    Inability: Not on file  . Transportation needs:    Medical: Not on file    Non-medical: Not on file  Tobacco Use  . Smoking status: Never Smoker  . Smokeless tobacco: Never Used  Substance and Sexual Activity  . Alcohol use: No  . Drug use: No  . Sexual activity: Not on file  Lifestyle  . Physical activity:    Days per week: Not on file    Minutes per session: Not on file  . Stress: Not on file  Relationships  . Social connections:    Talks on phone: Not on file    Gets together: Not on file    Attends religious service: Not on file    Active member of club or organization: Not on file    Attends  meetings of clubs or organizations: Not on file    Relationship status: Not on file  . Intimate partner violence:    Fear of current or ex partner: Not on file    Emotionally abused: Not on file    Physically abused: Not on file    Forced sexual activity: Not on file  Other Topics Concern  . Not on file  Social History Narrative   Lives at home with her husband.   Left-handed   Occasional caffeine use.    Medications:   Current Outpatient Medications on File Prior to Visit  Medication Sig Dispense Refill  . apixaban (ELIQUIS) 5 MG TABS tablet Take 10 mg (2 tabs) twice a day for 7 days.  Then take 5 mg (1 tab) twice a day. 74 tablet 0  . atorvastatin (LIPITOR) 10 MG tablet Take 1 tablet (10 mg total) by mouth daily at 6 PM. 30 tablet 3  . clotrimazole-betamethasone (LOTRISONE) cream Apply to affected area twice daily times  For 2 weeks    . FIBER PO Take by mouth daily.    . fluticasone (FLONASE) 50 MCG/ACT nasal spray Use 2 sprays in each nostril once daily    . glipiZIDE (GLUCOTROL) 5 MG tablet Take 5 mg by mouth daily after supper.    . Linagliptin-metFORMIN HCl (JENTADUETO) 2.5-500 MG TABS Take 1 tablet by mouth 2 (two) times daily.    Marland Kitchen. losartan (COZAAR) 25 MG tablet Take 25 mg by mouth daily. as directed  0  . montelukast (SINGULAIR) 10 MG tablet Take 10 mg by mouth at bedtime.   11  . Multiple Vitamin (MULTIVITAMIN WITH MINERALS) TABS tablet Take 1 tablet by mouth daily.    Marland Kitchen. nystatin (MYCOSTATIN/NYSTOP) powder Apply to affected area twice daily.    . pantoprazole (PROTONIX) 40 MG tablet Take 40 mg by mouth daily.    . valACYclovir (VALTREX) 500 MG tablet Take 500 mg by mouth daily.      No current facility-administered medications on file prior to visit.     Allergies:   Allergies  Allergen Reactions  . Amoxicillin Rash  . Azithromycin Rash  . Homatropine Rash  . Hydrocodone-Homatropine Rash  . Penicillin G Rash    Dyspepsia, ingestion  . Tape Rash    Physical  Exam General: well developed, well nourished middle-age Caucasian lady, seated, in no evident distress Head: head normocephalic and atraumatic.  Neck: supple with no carotid or supraclavicular bruits Cardiovascular: regular rate and rhythm, no murmurs Musculoskeletal: no deformity Skin:  no rash/petichiae Vascular:  Normal pulses all extremities Vitals:   07/12/17 1606  BP: 115/77  Pulse: 61   Neurologic Exam Mental Status: Awake and fully alert. Oriented to place and time. Recent and remote memory intact. Attention span, concentration and fund of knowledge appropriate. Mood and affect appropriate.  Speech mostly fluent with only occasional word finding difficulties. Good naming, comprehension and repetition. Recall 3/3. Able to name  8 animals with for legs. Cranial Nerves: Fundoscopic exam reveals sharp disc margins. Pupils equal, briskly reactive to light. Extraocular movements full without nystagmus. Visual fields full to confrontation. Hearing intact. Facial sensation intact. Face, tongue, palate moves normally and symmetrically.  Motor: Normal bulk and tone. Normal strength in all tested extremity muscles. Sensory.: intact to touch ,pinprick .position and vibratory sensation. Diminished vibration sensation over  Coordination: Rapid alternating movements normal in all extremities. Finger-to-nose and heel-to-shin performed accurately bilaterally. Gait and Station: Arises from chair without difficulty. Stance is normal. Gait demonstrates normal stride length and balance . Able to heel, toe and tandem walk without difficulty.  Reflexes: 1+ and symmetric except left ankle jerk is depressed. Toes downgoing.      ASSESSMENT: 7 year Caucasian lady with left middle cerebral artery branch infarct in September 2017 of cryptogenic etiology was doing well except for only minor's but finding difficulties. She is having some mild cognitive difficulties likely related to combination of mild depression  as well as post stroke mild cognitive impairment Which appear stable. New complaint pain and discomfort at night likely Underlying lumbar radiculopathy versus diabetic neuropathy also possible.    PLAN: I had a long discussion with the patient about her history of remote stroke, recent lower extremity DVT and new complaints of possible restless leg syndrome and answered questions. I recommend she stop plavix as long as she is on eliquis. She may resume Plavix in the future if she were to stop   Continue strict control of hypertension with blood pressure goal below 130/90 and diabetes with hemoglobin A1c goal below 6.5, lipids with LDL goal below 70 mg%.Recommend trial of Topamax 50 mg at night for restless leg syndrome/diabetic n/radiculopathy pain. Check EMG nerve conduction study. She was advised to call me in a few weeks  to discuss her response. She will return for follow-up in 2 months or call earlier if necessary Greater than 50% of time during this 25 minute visit was spent on counseling,explanation of diagnosis of cryptogenic stroke, restless legs mild cognitive impairment, planning of further management, discussion with patient and family and coordination of care Delia Heady, MD Medical Director [facility] Pager: [phone] 07/12/2017 5:01 PM Note: This document was prepared with digital dictation and possible smart phrase technology. Any transcriptional errors that result from this process are unintentional

## 2017-07-12 NOTE — Patient Instructions (Addendum)
I had a long discussion with the patient about her history of remote stroke, recent lower extremity DVT and new complaints of possible restless leg syndrome and answered questions. I recommend she stop plavix as long as she is on eliquis. She may resume Plavix in the future if she were to stop   Continue strict control of hypertension with blood pressure goal below 130/90 and diabetes with hemoglobin A1c goal below 6.5, lipids with LDL goal below 70 mg%.Recommend trial of Topamax 50 mg at night for restless leg syndrome/diabetic n/radiculopathy pain. Check EMG nerve conduction study. She was advised to call me in a few weeks  to discuss her response. She will return for follow-up in 2 months or call earlier if necessary Restless Legs Syndrome Restless legs syndrome is a condition that causes uncomfortable feelings or sensations in the legs, especially while sitting or lying down. The sensations usually cause an overwhelming urge to move the legs. The arms can also sometimes be affected. The condition can range from mild to severe. The symptoms often interfere with a person's ability to sleep. What are the causes? The cause of this condition is not known. What increases the risk? This condition is more likely to develop in:  People who are older than age 64.  Pregnant women. In general, restless legs syndrome is more common in women than in men.  People who have a family history of the condition.  People who have certain medical conditions, such as iron deficiency, kidney disease, Parkinson disease, or nerve damage.  People who take certain medicines, such as medicines for high blood pressure, nausea, colds, allergies, depression, and some heart conditions.  What are the signs or symptoms? The main symptom of this condition is uncomfortable sensations in the legs. These sensations may be:  Described as pulling, tingling, prickling, throbbing, crawling, or burning.  Worse while you are sitting  or lying down.  Worse during periods of rest or inactivity.  Worse at night, often interfering with your sleep.  Accompanied by a very strong urge to move your legs.  Temporarily relieved by movement of your legs.  The sensations usually affect both sides of the body. The arms can also be affected, but this is rare. People who have this condition often have tiredness during the day because of their lack of sleep at night. How is this diagnosed? This condition may be diagnosed based on your description of the symptoms. You may also have tests, including blood tests, to check for other conditions that may lead to your symptoms. In some cases, you may be asked to spend some time in a sleep lab so your sleeping can be monitored. How is this treated? Treatment for this condition is focused on managing the symptoms. Treatment may include:  Self-help and lifestyle changes.  Medicines.  Follow these instructions at home:  Take medicines only as directed by your health care provider.  Try these methods to get temporary relief from the uncomfortable sensations: ? Massage your legs. ? Walk or stretch. ? Take a cold or hot bath.  Practice good sleep habits. For example, go to bed and get up at the same time every day.  Exercise regularly.  Practice ways of relaxing, such as yoga or meditation.  Avoid caffeine and alcohol.  Do not use any tobacco products, including cigarettes, chewing tobacco, or electronic cigarettes. If you need help quitting, ask your health care provider.  Keep all follow-up visits as directed by your health care provider. This  is important. Contact a health care provider if: Your symptoms do not improve with treatment, or they get worse. This information is not intended to replace advice given to you by your health care provider. Make sure you discuss any questions you have with your health care provider. Document Released: 03/27/2002 Document Revised: 09/12/2015  Document Reviewed: 04/02/2014 Elsevier Interactive Patient Education  [employer].

## 2017-07-13 ENCOUNTER — Encounter: Payer: Self-pay | Admitting: Neurology

## 2017-07-13 ENCOUNTER — Ambulatory Visit (INDEPENDENT_AMBULATORY_CARE_PROVIDER_SITE_OTHER): Payer: 59 | Admitting: Neurology

## 2017-07-13 ENCOUNTER — Ambulatory Visit: Payer: 59 | Admitting: Neurology

## 2017-07-13 ENCOUNTER — Telehealth: Payer: Self-pay | Admitting: Neurology

## 2017-07-13 DIAGNOSIS — R202 Paresthesia of skin: Secondary | ICD-10-CM | POA: Diagnosis not present

## 2017-07-13 DIAGNOSIS — G5602 Carpal tunnel syndrome, left upper limb: Secondary | ICD-10-CM

## 2017-07-13 DIAGNOSIS — G2581 Restless legs syndrome: Secondary | ICD-10-CM

## 2017-07-13 HISTORY — DX: Carpal tunnel syndrome, left upper limb: G56.02

## 2017-07-13 NOTE — Procedures (Signed)
HISTORY:  Caroline [name] is a 62 year old patient with a history of leg discomfort in the evening hours, she indicates that when she is up walking the discomfort abates.  She believes that there is left greater than right hip discomfort as well.  She has some tingling sensations in the left hand.  She is being evaluated for these issues.  NERVE CONDUCTION STUDIES:  Nerve conduction studies were performed on the left upper extremity.  The distal motor latency for the left median nerve is slightly prolonged with a normal motor amplitude.  The distal motor latency and motor amplitude for the left ulnar nerve is normal.  The nerve conduction velocities for the left median and ulnar nerves were normal.  The sensory latencies for the median nerve on the left was prolonged, normal for the left ulnar nerve.  The F wave latency for the left ulnar nerve was normal.  Nerve conduction studies were performed on both lower extremities.  The distal motor latency for the right peroneal nerve is borderline normal with a normal motor amplitude.  Slight slowing seen above and below the fibular head for this nerve.  The distal motor latency motor amplitude for the left peroneal nerve is normal with a normal nerve conduction velocity.  The distal motor latencies for the posterior tibial nerves were slightly prolonged on the right and normal on the left with slight slowing of the right posterior tibial nerve, borderline normal nerve conduction velocities for the left posterior tibial nerve.  The sensory latencies for the sural nerves were within normal limits bilaterally, and the peroneal sensory latencies were within normal limits bilaterally.  The F-wave latencies for the posterior tibial nerves were prolonged bilaterally.  EMG STUDIES:  EMG study was performed on the left lower extremity:  The tibialis anterior muscle reveals 2 to 4K motor units with full recruitment. No fibrillations or positive waves were  seen. The peroneus tertius muscle reveals 2 to 4K motor units with full recruitment. No fibrillations or positive waves were seen. The medial gastrocnemius muscle reveals 1 to 3K motor units with full recruitment. No fibrillations or positive waves were seen. The vastus lateralis muscle reveals 2 to 4K motor units with full recruitment. No fibrillations or positive waves were seen. The iliopsoas muscle reveals 2 to 4K motor units with full recruitment. No fibrillations or positive waves were seen. The biceps femoris muscle (long head) reveals 2 to 4K motor units with full recruitment. No fibrillations or positive waves were seen. The lumbosacral paraspinal muscles were tested at 3 levels, and revealed no abnormalities of insertional activity at all 3 levels tested. There was good relaxation.  EMG study was performed on the right lower extremity:  The tibialis anterior muscle reveals 2 to 4K motor units with full recruitment. No fibrillations or positive waves were seen. The peroneus tertius muscle reveals 2 to 4K motor units with full recruitment. No fibrillations or positive waves were seen. The medial gastrocnemius muscle reveals 1 to 3K motor units with full recruitment. No fibrillations or positive waves were seen. The vastus lateralis muscle reveals 2 to 4K motor units with slightly reduced recruitment. No fibrillations or positive waves were seen. The iliopsoas muscle reveals 2 to 4K motor units with full recruitment. No fibrillations or positive waves were seen. The biceps femoris muscle (long head) reveals 2 to 4K motor units with full recruitment. No fibrillations or positive waves were seen. The lumbosacral paraspinal muscles were tested at 3 levels, and revealed no  abnormalities of insertional activity at all 3 levels tested. There was good relaxation.   IMPRESSION:  Nerve conduction studies done on the left upper extremity shows evidence of a mild left carpal tunnel syndrome.  Nerve  conductions of both lower extremities show borderline normal/slight slowing for the peroneal and posterior tibial nerves bilaterally with borderline normal sural sensory latencies.  It is possible that there may be a very mild evolving peripheral neuropathy, clinical correlation is required.  EMG evaluation of the lower extremities were essentially normal bilaterally, no clear evidence of an overlying lumbosacral radiculopathy is seen on either side.  Marlan Palau MD 07/13/2017 12:54 PM  [facility] [address] [place], [place] [address]  Phone [phone] Fax [phone]

## 2017-07-13 NOTE — Telephone Encounter (Signed)
[facility] for NCV and EMG.

## 2017-07-13 NOTE — Progress Notes (Signed)
Please refer to EMG and nerve conduction study procedure note. 

## 2017-07-13 NOTE — Telephone Encounter (Signed)
If Patient is coming to the the office a PA is does not need to be done. I would ask Debbie in billing I only handle out going PA's

## 2017-07-13 NOTE — Telephone Encounter (Signed)
Pt called stating [employer] is needing a PA for her NCV and EMG today. Stating it is 100% covered by insurance but will need PA before appt. (PA phone # [phone])

## 2017-07-13 NOTE — Progress Notes (Signed)
[facility]    Nerve / Sites Muscle Latency Ref. Amplitude Ref. Rel Amp Segments Distance Velocity Ref. Area    ms ms mV mV %  cm m/s m/s mVms  L Median - APB     Wrist APB 4.1 ?4.4 8.2 ?4.0 100 Wrist - APB 7   30.0     Upper arm APB 8.1  7.8  95 Upper arm - Wrist 20 50 ?49 27.7  L Ulnar - ADM     Wrist ADM 3.0 ?3.3 6.4 ?6.0 100 Wrist - ADM 7   23.0     B.Elbow ADM 6.7  6.2  97.4 B.Elbow - Wrist 20 54 ?49 22.4     A.Elbow ADM 8.5  5.8  93.1 A.Elbow - B.Elbow 10 55 ?49 20.5         A.Elbow - Wrist      R Peroneal - EDB     Ankle EDB 6.5 ?6.5 2.5 ?2.0 100 Ankle - EDB 9   8.1     Fib head EDB 13.9  2.7  109 Fib head - Ankle 30 41 ?44 8.6     Pop fossa EDB 16.5  2.3  84.3 Pop fossa - Fib head 11 42 ?44 7.5         Pop fossa - Ankle      L Peroneal - EDB     Ankle EDB 5.2 ?6.5 7.4 ?2.0 100 Ankle - EDB 9   27.2     Fib head EDB 12.0  6.7  90.4 Fib head - Ankle 30 44 ?44 25.6     Pop fossa EDB 14.5  6.3  95.1 Pop fossa - Fib head 11 44 ?44 24.5         Pop fossa - Ankle      R Tibial - AH     Ankle AH 6.0 ?5.8 6.0 ?4.0 100 Ankle - AH 9   15.5     Pop fossa AH 15.5  4.7  77.5 Pop fossa - Ankle 37 39 ?41 10.9  L Tibial - AH     Ankle AH 5.3 ?5.8 12.0 ?4.0 100 Ankle - AH 9   28.1     Pop fossa AH 14.4  7.2  59.7 Pop fossa - Ankle 37 41 ?41 23.6                      [facility]    Nerve / Sites Rec. Site Peak Lat Ref.  Amp Ref. Segments Distance    ms ms V V  cm  R Sural - Ankle (Calf)     Calf Ankle 4.3 ?4.4 11 ?6 Calf - Ankle 14  L Sural - Ankle (Calf)     Calf Ankle 4.4 ?4.4 9 ?6 Calf - Ankle 14  R Superficial peroneal - Ankle     Lat leg Ankle 3.5 ?4.4 7 ?6 Lat leg - Ankle 14  L Superficial peroneal - Ankle     Lat leg Ankle 3.7 ?4.4 8 ?6 Lat leg - Ankle 14  L Median - Orthodromic (Dig II, Mid palm)     Dig II Wrist 3.8 ?3.4 17 ?10 Dig II - Wrist 13  L Ulnar - Orthodromic, (Dig V, Mid palm)     Dig V Wrist 2.8 ?3.1 13 ?5 Dig V - Wrist 11                 F  Wave    Nerve  F Lat Ref.    ms ms  R Tibial - AH 60.4 ?56.0  L Tibial - AH 58.0 ?56.0  L Ulnar - ADM 28.1 ?32.0           EMG full

## 2017-07-28 ENCOUNTER — Telehealth: Payer: Self-pay

## 2017-07-28 NOTE — Telephone Encounter (Signed)
Rn call patient about her EMG/NCV results. Rn stated the study shows evidence of mild carpal tunnel i.e pinched nerve in left wrist But no other definite evidence of peripheral neuropathy.Advise her to wear a left wrist extensiion splint at night and avoid activities which involve rapid wrist flexion activities. RN stated there is also per DR. SEthi no peripheral neuropathy in the lower extremities. RN stated if she works full time using her hands to wear a day time splint. PT verbalized understanding.Marland Kitchen. ------

## 2017-07-28 NOTE — Telephone Encounter (Signed)
-----   Message from Micki RileyPramod S Sethi, MD sent at 07/28/2017 12:20 PM EDT ----- Joneen RoachKindly advise patient that EMG/NCV study shows evidence of mild carpal tunnel i.e pinched nerve in left wrist  But no other definite evidence of peripheral neuropathy.Advise her to wear a left wrist extensiion splint at night and avoid activities which involve rapid wrist flexion activities

## 2017-08-09 ENCOUNTER — Ambulatory Visit (INDEPENDENT_AMBULATORY_CARE_PROVIDER_SITE_OTHER): Payer: 59 | Admitting: *Deleted

## 2017-08-09 DIAGNOSIS — I639 Cerebral infarction, unspecified: Secondary | ICD-10-CM

## 2017-08-09 NOTE — Progress Notes (Signed)
Carelink Summary Report / Loop Recorder 

## 2017-08-13 LAB — CUP PACEART REMOTE DEVICE CHECK
Implantable Pulse Generator Implant Date: 20170920
MDC IDC SESS DTM: 20190318220526

## 2017-08-30 IMAGING — DX DG CHEST 2V
2 series · 2 of 2 positions shown · non-contrast
Comparison: None.

CLINICAL DATA: Stroke. Aphasia. History of diabetes and
hypertension.

EXAM:
CHEST  2 VIEW

[chest lat]
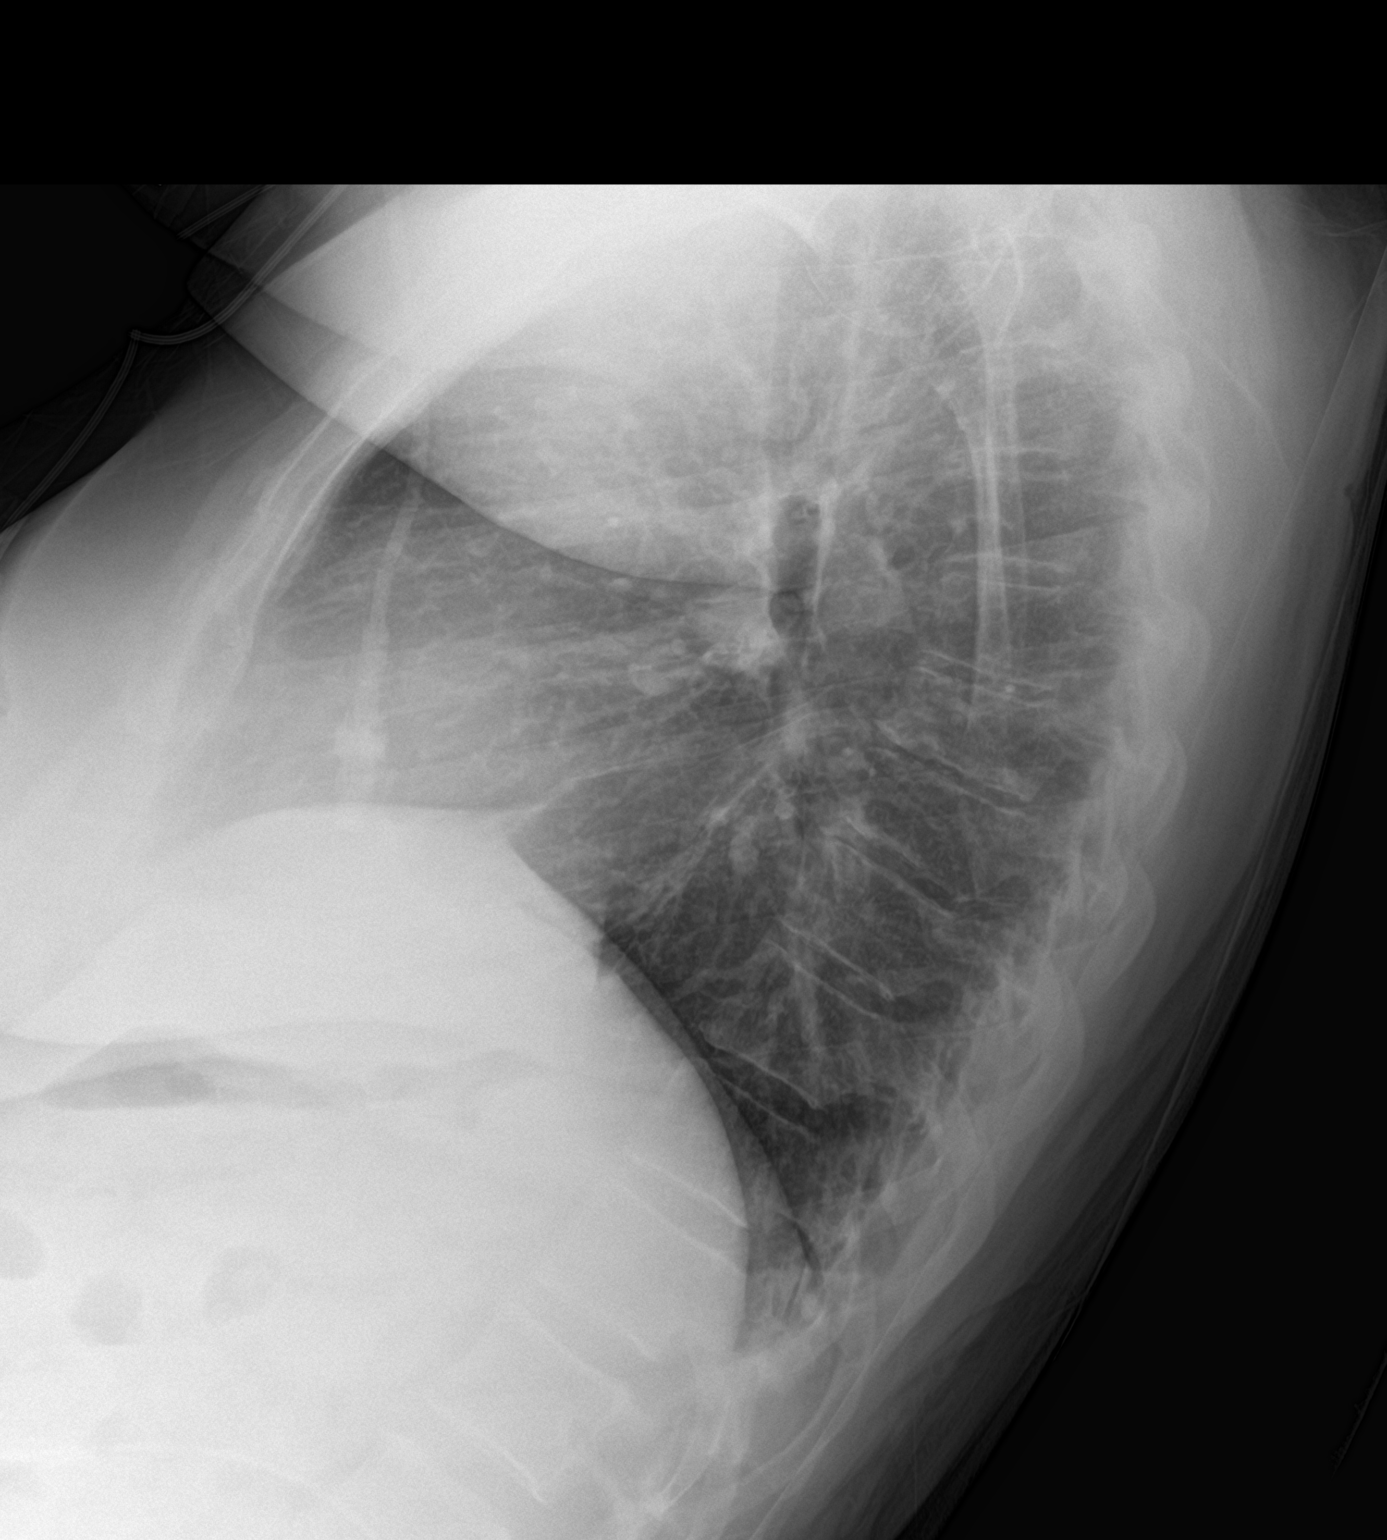

[chest ap]
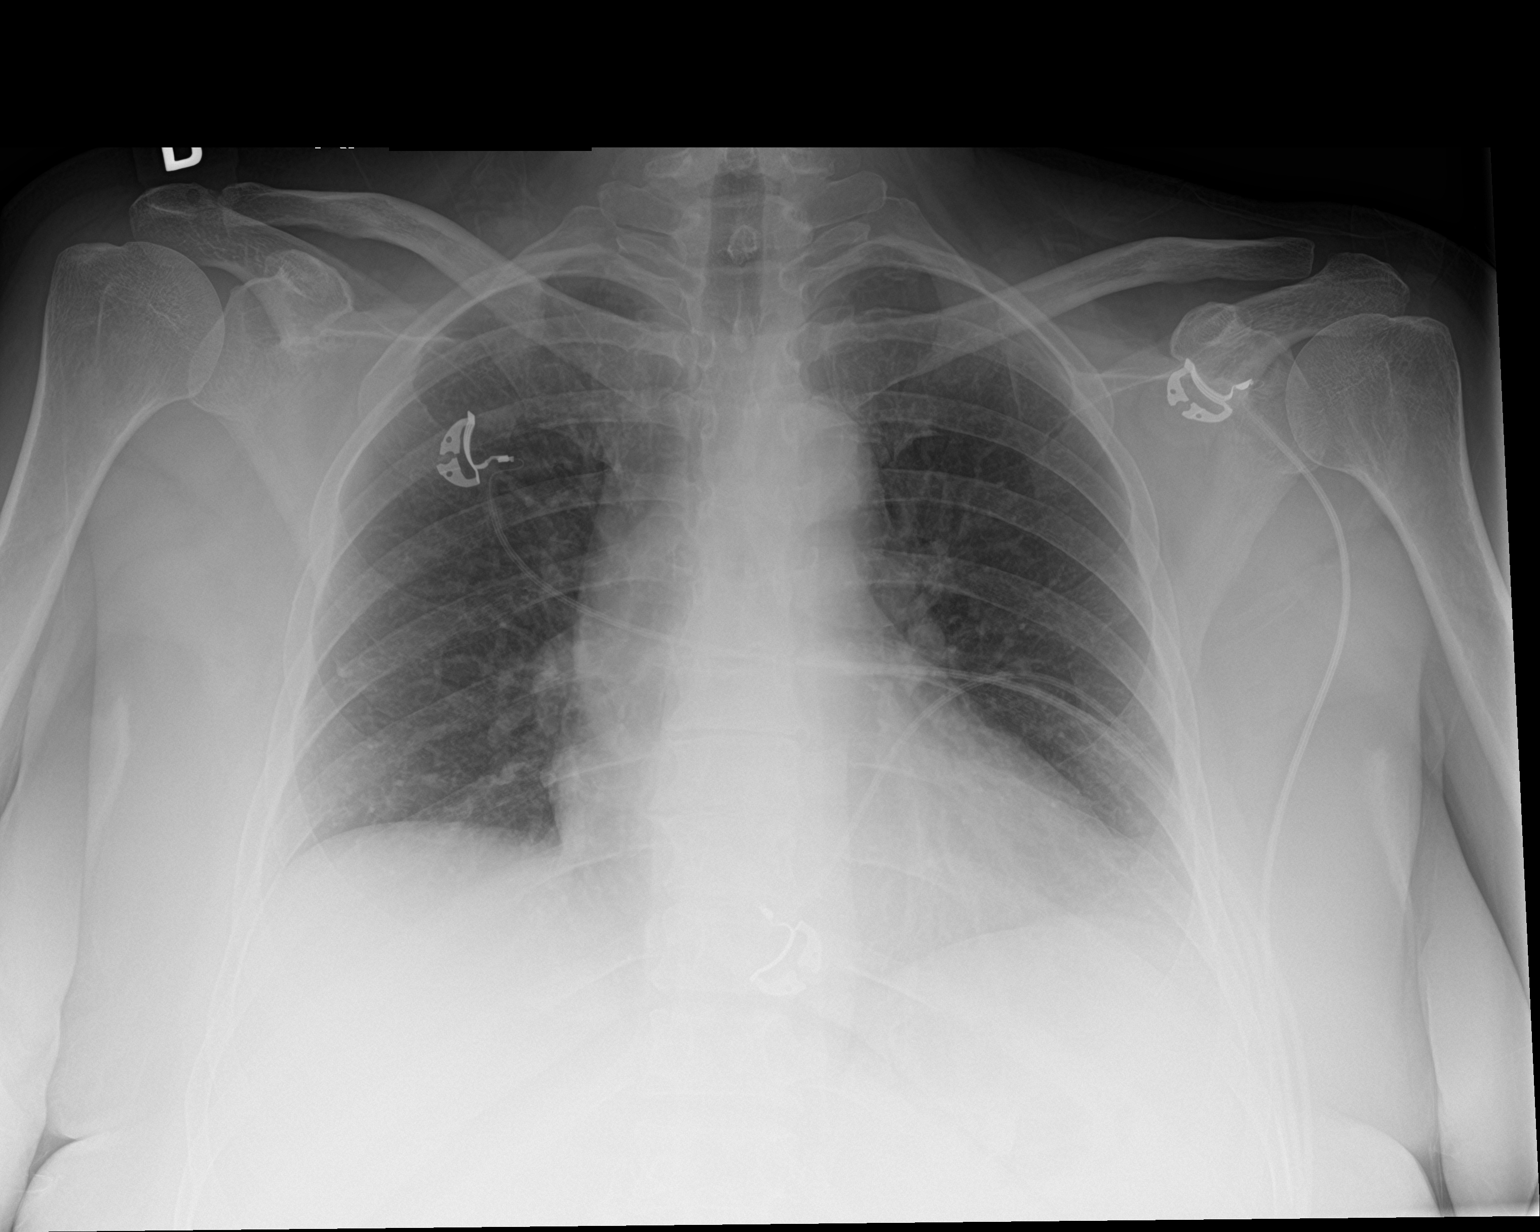

[2 of 2 positions shown; findings below may reference images not displayed]

FINDINGS: The cardiac silhouette is upper limits of normal in size,
accentuated by AP technique and mildly low lung volumes. No airspace
consolidation, edema, pleural effusion, or pneumothorax is
identified. No acute osseous abnormality is seen.
IMPRESSION: No active cardiopulmonary disease.

## 2017-08-30 IMAGING — CT CT HEAD CODE STROKE
3 of 4 series · 17 of 47 positions shown, 20 images · non-contrast
Comparison: None.

CLINICAL DATA: Code stroke.

EXAM:
CT HEAD WITHOUT CONTRAST
TECHNIQUE: Contiguous axial images were obtained from the base of the skull
through the vertex without intravenous contrast.

[Series 201: head w/o, idose (1) · axial · non-contrast · 0.40mm/px · z∈[+1223,+1343]mm · 11 of 30 slices shown, 14 images]
[im 3/30  brain]
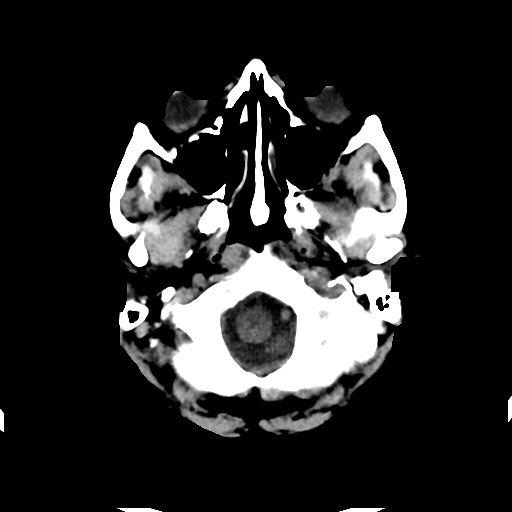
[im 3/30  bone]
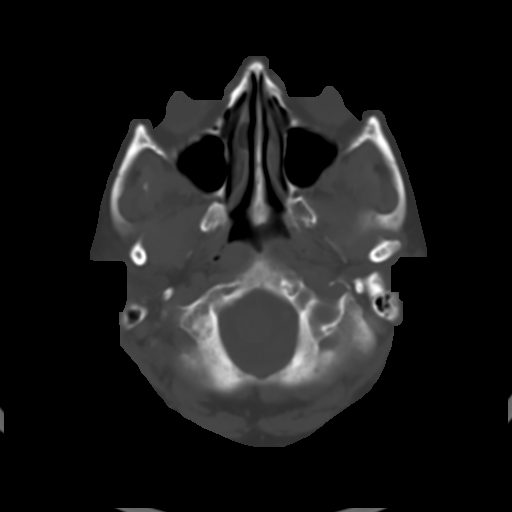
[im 5/30  brain]
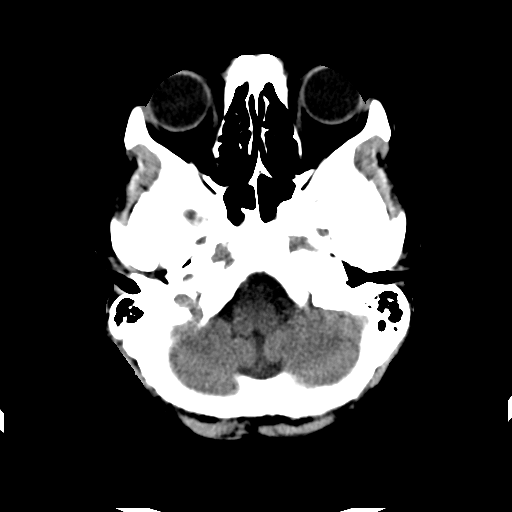
[im 7/30  brain]
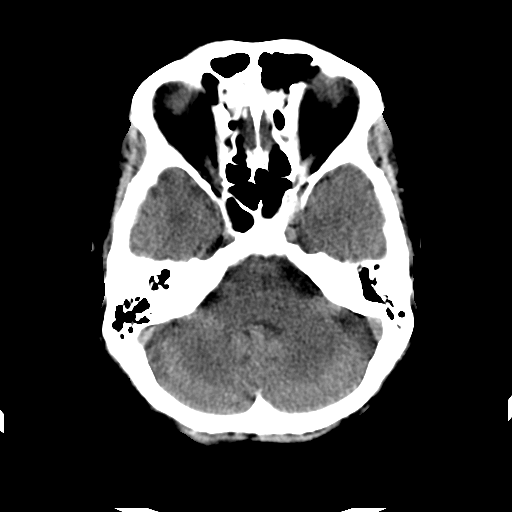
[im 11/30  brain]
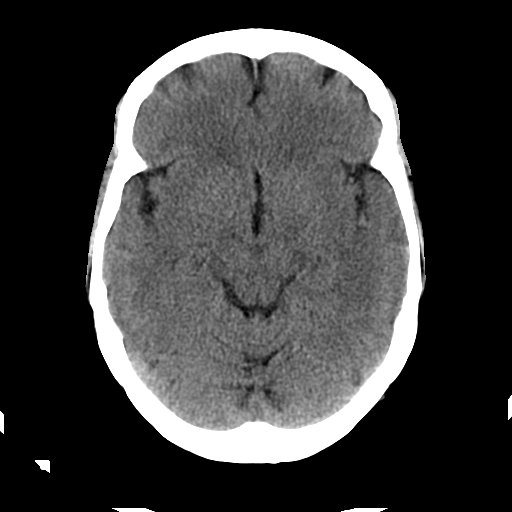
[im 13/30  brain]
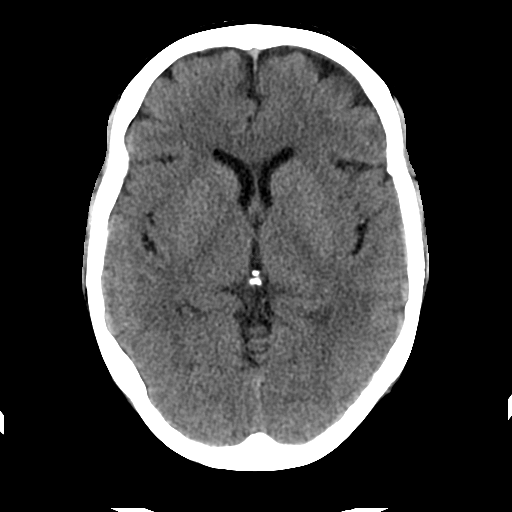
[im 13/30  bone]
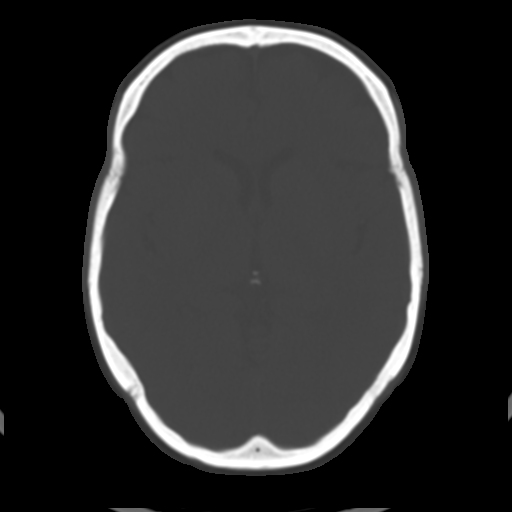
[im 15/30  brain]
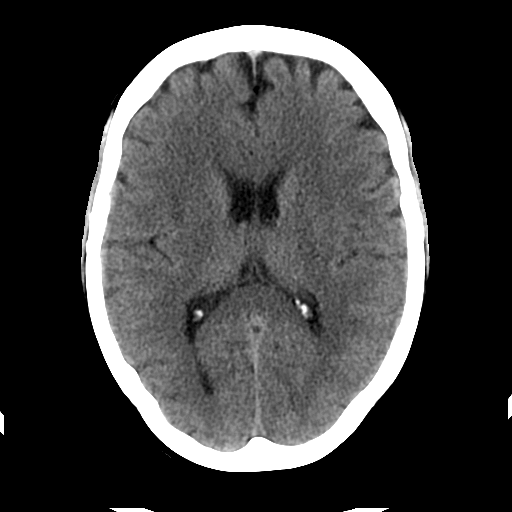
[im 17/30  brain]
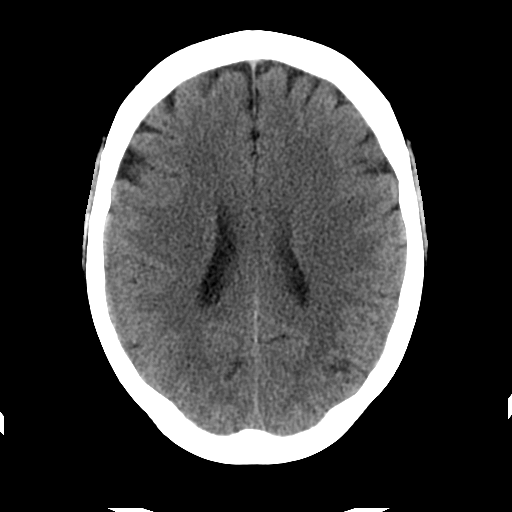
[im 19/30  brain]
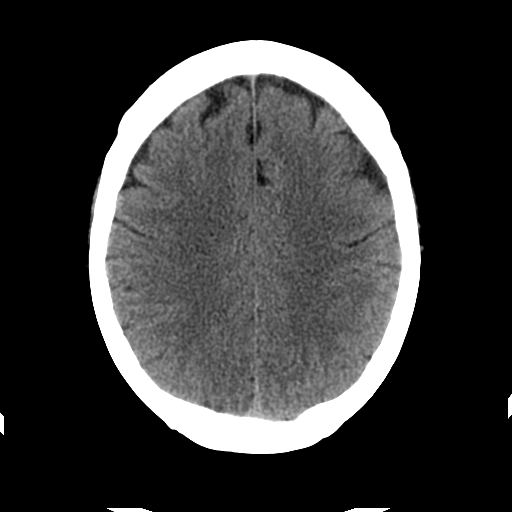
[im 23/30  brain]
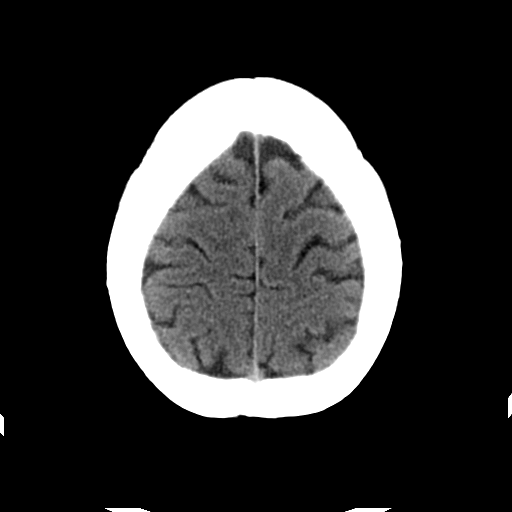
[im 23/30  bone]
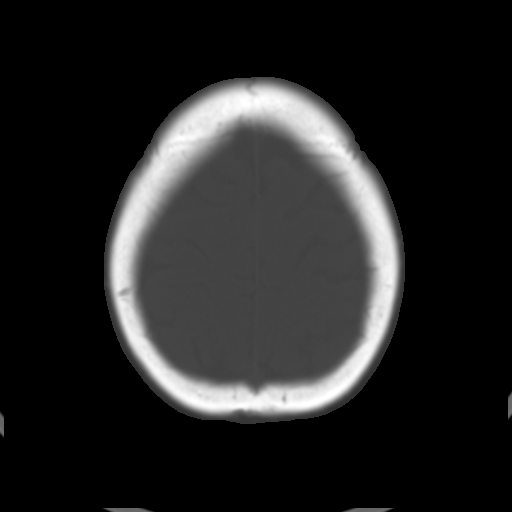
[im 25/30  brain]
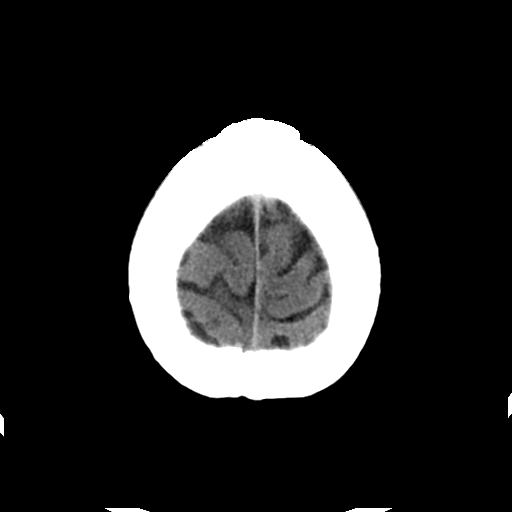
[im 27/30  brain]
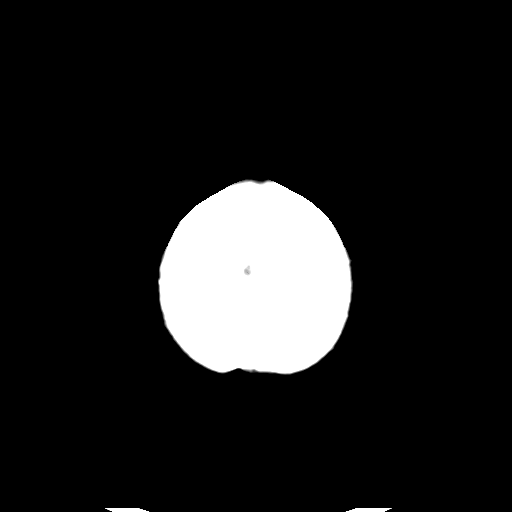

[Series 203: coronal st, idose (1) · coronal · 0.39mm/px · 3 of 66 slices shown]
[im 22/66  brain]
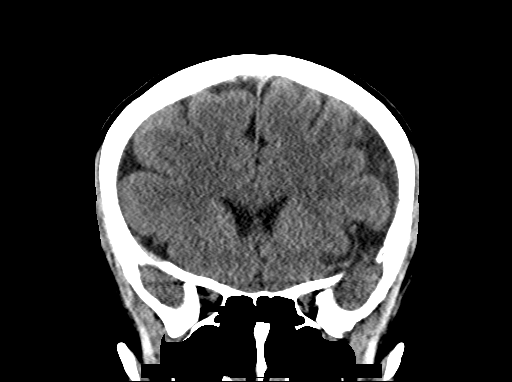
[im 29/66  brain]
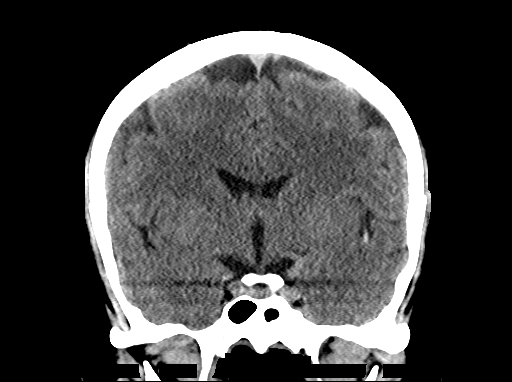
[im 37/66  brain]
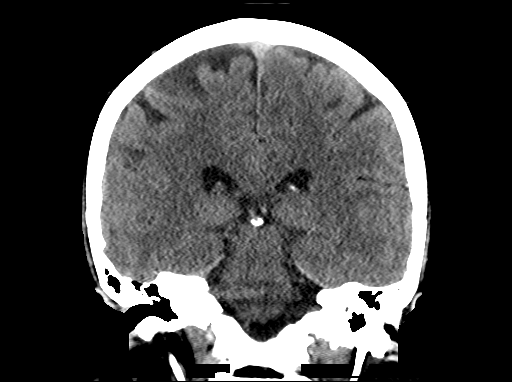

[Series 204: sagittal st, idose (1) · sagittal · 0.39mm/px · 3 of 66 slices shown]
[im 22/66  brain]
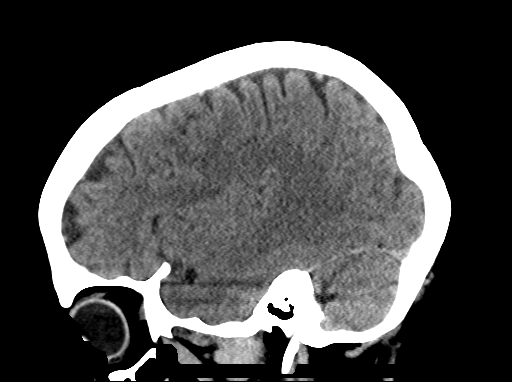
[im 33/66  brain]
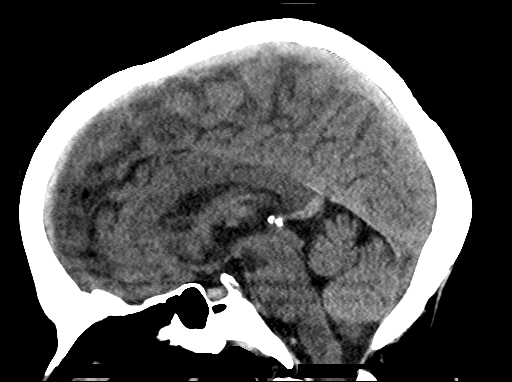
[im 44/66  brain]
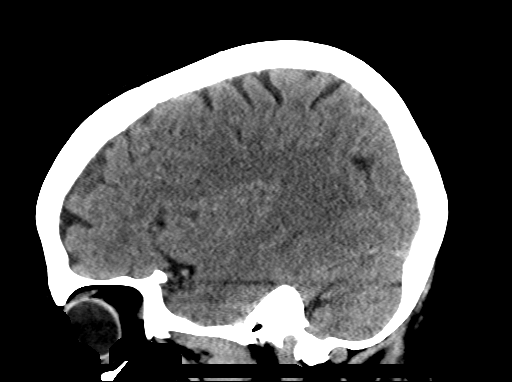

[17 of 47 positions shown; findings below may reference images not displayed]

Asymmetric hypodensity affects the LEFT insula, and LEFT temporal
cortex consistent with acute infarction. There is a dense LEFT MCA
M2 or M3 branch in the sylvian fissure (image 12 series 201).
Asymmetric but more subtle hyperdensity of the LEFT ICA terminus as
seen on image 29 series 203 coronal and LEFT M1 MCA as seen on axial
image 10 could signify a more proximal thrombosis.

No hemorrhage, mass lesion, hydrocephalus, or extra-axial fluid.

Normal for age cerebral volume. Suspected mild chronic microvascular
ischemic change. Old LEFT parietal cortical infarct as seen on image
18.

Calvarium intact.  No sinus or mastoid disease.

ASPECTS (Alberta Stroke Program Early CT Score)

- Ganglionic level infarction (caudate, lentiform nuclei, internal
capsule, insula, M1-M3 cortex): 5

- Supraganglionic infarction (M4-M6 cortex): 3

Total score (0-10 with 10 being normal): 8
IMPRESSION: 1. Acute LEFT hemisphere affecting the insula and LEFT temporal
lobe.

2. ASPECTS is 8.

3. Hyperdense LEFT MCA M2/M3 vessel in the sylvian fissure. Subtle
asymmetric hyperdensity of the LEFT ICA terminus and LEFT M1 MCA
could signify a more proximal thrombosis. CTA head/neck is pending.

These results were called by telephone at the time of interpretation
on 01/02/2016 at [DATE] to Dr. Pushplata, who verbally
acknowledged these results.

## 2017-09-06 LAB — CUP PACEART REMOTE DEVICE CHECK
Date Time Interrogation Session: 20190420223939
MDC IDC PG IMPLANT DT: 20170920

## 2017-09-09 ENCOUNTER — Ambulatory Visit (INDEPENDENT_AMBULATORY_CARE_PROVIDER_SITE_OTHER): Payer: 59 | Admitting: *Deleted

## 2017-09-09 DIAGNOSIS — I639 Cerebral infarction, unspecified: Secondary | ICD-10-CM | POA: Diagnosis not present

## 2017-09-09 NOTE — Progress Notes (Signed)
Carelink Summary Report / Loop Recorder 

## 2017-10-04 LAB — CUP PACEART REMOTE DEVICE CHECK
Date Time Interrogation Session: 20190523233933
MDC IDC PG IMPLANT DT: 20170920

## 2017-10-07 ENCOUNTER — Ambulatory Visit: Payer: 59 | Admitting: Neurology

## 2017-10-12 ENCOUNTER — Ambulatory Visit (INDEPENDENT_AMBULATORY_CARE_PROVIDER_SITE_OTHER): Payer: 59 | Admitting: *Deleted

## 2017-10-12 DIAGNOSIS — I639 Cerebral infarction, unspecified: Secondary | ICD-10-CM | POA: Diagnosis not present

## 2017-10-13 NOTE — Progress Notes (Signed)
Carelink Summary Report / Loop Recorder 

## 2017-10-20 ENCOUNTER — Encounter: Payer: Self-pay | Admitting: Adult Health

## 2017-10-20 ENCOUNTER — Ambulatory Visit: Payer: 59 | Admitting: Neurology

## 2017-10-20 ENCOUNTER — Ambulatory Visit: Payer: 59 | Admitting: Adult Health

## 2017-10-20 VITALS — BP 125/74 | HR 63 | Ht 63.0 in | Wt 193.2 lb

## 2017-10-20 DIAGNOSIS — I10 Essential (primary) hypertension: Secondary | ICD-10-CM

## 2017-10-20 DIAGNOSIS — E785 Hyperlipidemia, unspecified: Secondary | ICD-10-CM

## 2017-10-20 DIAGNOSIS — I63312 Cerebral infarction due to thrombosis of left middle cerebral artery: Secondary | ICD-10-CM

## 2017-10-20 DIAGNOSIS — G5602 Carpal tunnel syndrome, left upper limb: Secondary | ICD-10-CM | POA: Diagnosis not present

## 2017-10-20 MED ORDER — CITALOPRAM HYDROBROMIDE 20 MG PO TABS: 20.0000 mg | ORAL_TABLET | Freq: Every evening | ORAL | 2 refills | Status: DC

## 2017-10-20 MED ORDER — TOPIRAMATE 50 MG PO TABS: 50.0000 mg | ORAL_TABLET | Freq: Every day | ORAL | 4 refills | Status: DC

## 2017-10-20 NOTE — Progress Notes (Signed)
[facility] [address] [place]. [place] [address] [phone]       OFFICE FOLLOW-UP NOTE  Ms. 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 She was seen and last visit byDr. Leland Johns   on 02/2116 and subsequently referred to Dr. Frances Furbish for sleep apnoea who did polysomnogram and confirmed sleep apnea. She has since been started on CPAP and is tolerating it well and has noted improvement. Return for follow-up today to see me. She is a complaint by daughter and husband. The patient states her stroke symptoms have improved significantly but she still has some word finding difficulties but equally towards the afternoon and and of the day. This is more prominent when she is tired and exhausted. 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 She states that her mind is not as sharp and it takes her longer to get things done. She does have a remote history of depression in the past but she is currently not on any medications. She states she gets tired easily and does not have the stamina to do a lot of physical work. She is tolerating Plavix well without bleeding or bruising. She states her blood pressure is well controlled today it is [id number]. She is tolerating Lipitor well without muscle aches and pains. She is using CPAP regularly and finds it helpful.. She has a loop recorder in place and so for paroxysmal atrial fibrillation has not yet been found Update 07/12/2017 : She returns for follow-up after last ultrasound on 06/12/17 and started on eliquis. She had lab work done on 07/08/17 ordered by her hematologist which showed negative anti-phospholipid antibodies and beta 2  glycoproteins..she is tolerating eliquis well but also remains on Plavix.She continues to have some word finding difficulties particularly towards the evening time or when she is tired. She admits to being under increased stress due to to her medical issues. She complains of significant pain and discomfort in her left leg at night at times she wakes up in the middle due to discomfort and pain. This may occasionally go up her leg up to her hip. She has history of degenerative cervical spine disease with history of left arm numbness in the past.She denies any new stroke or TIA symptoms.  10/20/2017 update: Patient returns today for 20-month follow-up and is accompanied by her husband.  EEG performed on 07/13/2017 and did show evidence of mild carpal tunnel in her left wrist along with very mild evolving peripheral neuropathy.  She continues to take Topamax 50 mg nightly and states this has been helping with her restless leg syndrome at night along with left wrist pain from carpal tunnel.  She has been using her left wrist brace at night and states she no longer experiences the tingling and numbness.  She continues to take Eliquis due to clot and denies bleeding or bruising.  She does have a follow-up appointment with Dr. Lindaann Pascal in September and they will review whether continuation of Eliquis or return back to Plavix.  She states approximately 3 weeks ago, she had her right index finger knuckle on her bed post and since then has been having pain along with swelling and feeling of weakness when attempting to  grab an object or make a fist.  Patient denies any radiating pain and is unsure if she has pain when attempting to use that hand to pick up any objects.  Patient has not been seen by PCP for this complaint.  Patient also recently expressed concerns of increased depression recently.  States that she has not had a problem in the past with depression but since her stroke she has had mild depression but this is only  worsened in the past few months.  Numerous situational causes at this time but patient is questioning whether she can start on depression medication.  Continues to have very mild dysarthria from prior stroke in 2017.  Loop recorder negative for atrial fibrillation thus far.  Patient continues to stay active without new or worsening stroke/TIA symptoms.    ROS:   14 system review of systems is positive for itching, constipation, and depression and all other systems negative  PMH:  Past Medical History:  Diagnosis Date  . CVA (cerebral vascular accident) (HCC)   . Diabetes mellitus without complication (HCC)   . Left carpal tunnel syndrome 07/13/2017  . OSA on CPAP   . Reflux     Social History:  Social History   Socioeconomic History  . Marital status: Married    Spouse name: Not on file  . Number of children: 2  . Years of education: HS  . Highest education level: Not on file  Occupational History  . Occupation: [employer]  Social Needs  . Financial resource strain: Not on file  . Food insecurity:    Worry: Not on file    Inability: Not on file  . Transportation needs:    Medical: Not on file    Non-medical: Not on file  Tobacco Use  . Smoking status: Never Smoker  . Smokeless tobacco: Never Used  Substance and Sexual Activity  . Alcohol use: No  . Drug use: No  . Sexual activity: Not on file  Lifestyle  . Physical activity:    Days per week: Not on file    Minutes per session: Not on file  . Stress: Not on file  Relationships  . Social connections:    Talks on phone: Not on file    Gets together: Not on file    Attends religious service: Not on file    Active member of club or organization: Not on file    Attends meetings of clubs or organizations: Not on file    Relationship status: Not on file  . Intimate partner violence:    Fear of current or ex partner: Not on file    Emotionally abused: Not on file    Physically abused: Not on file    Forced sexual  activity: Not on file  Other Topics Concern  . Not on file  Social History Narrative   Lives at home with her husband.   Left-handed   Occasional caffeine use.    Medications:   Current Outpatient Medications on File Prior to Visit  Medication Sig Dispense Refill  . apixaban (ELIQUIS) 5 MG TABS tablet Take 10 mg (2 tabs) twice a day for 7 days.  Then take 5 mg (1 tab) twice a day. 74 tablet 0  . atorvastatin (LIPITOR) 10 MG tablet Take 1 tablet (10 mg total) by mouth daily at 6 PM. 30 tablet 3  . clotrimazole-betamethasone (LOTRISONE) cream Apply to affected area twice daily times  For 2 weeks prn    . FIBER  PO Take by mouth daily.    . fluticasone (FLONASE) 50 MCG/ACT nasal spray Use 2 sprays in each nostril once daily    . glipiZIDE (GLUCOTROL) 5 MG tablet Take 5 mg by mouth daily after supper.    . Linagliptin-metFORMIN HCl (JENTADUETO) 2.5-500 MG TABS Take 1 tablet by mouth 2 (two) times daily.    Marland Kitchen losartan (COZAAR) 25 MG tablet Take 25 mg by mouth daily. as directed  0  . Melatonin 10 MG TABS Take 10 mg by mouth at bedtime.    . montelukast (SINGULAIR) 10 MG tablet Take 10 mg by mouth at bedtime.   11  . Multiple Vitamin (MULTIVITAMIN WITH MINERALS) TABS tablet Take 1 tablet by mouth daily.    Marland Kitchen nystatin (MYCOSTATIN/NYSTOP) powder Apply to affected area twice daily.prn    . pantoprazole (PROTONIX) 40 MG tablet Take 40 mg by mouth daily.    . valACYclovir (VALTREX) 500 MG tablet Take 500 mg by mouth daily.      No current facility-administered medications on file prior to visit.     Allergies:   Allergies  Allergen Reactions  . Amoxicillin Rash  . Azithromycin Rash  . Homatropine Rash  . Hydrocodone-Homatropine Rash  . Penicillin G Rash    Dyspepsia, ingestion  . Tape Rash    Physical Exam General: well developed, well nourished middle-age Caucasian lady, seated, in no evident distress Head: head normocephalic and atraumatic.  Neck: supple with no carotid or  supraclavicular bruits Cardiovascular: regular rate and rhythm, no murmurs Musculoskeletal: no deformity Skin:  no rash/petichiae Vascular:  Normal pulses all extremities Vitals:   10/20/17 1423  BP: 125/74  Pulse: 63   Neurologic Exam Mental Status: Awake and fully alert. Oriented to place and time. Recent and remote memory intact. Attention span, concentration and fund of knowledge appropriate. Mood and affect appropriate. Speech mostly fluent with only occasional word finding difficulties.  Number Cranial Nerves: Fundoscopic exam reveals sharp disc margins. Pupils equal, briskly reactive to light. Extraocular movements full without nystagmus. Visual fields full to confrontation. Hearing intact. Facial sensation intact. Face, tongue, palate moves normally and symmetrically.  Motor: Normal bulk and tone. Normal strength in all tested extremity muscles. Sensory.: intact to touch ,pinprick .position and vibratory sensation. Diminished vibration sensation over  Coordination: Rapid alternating movements normal in all extremities. Finger-to-nose and heel-to-shin performed accurately bilaterally. Gait and Station: Arises from chair without difficulty. Stance is normal. Gait demonstrates normal stride length and balance . Able to heel, toe and tandem walk without difficulty.  Reflexes: 1+ and symmetric except left ankle jerk is depressed. Toes downgoing.   IMAGING:   and then reviewing all her medications she is on taking anything currently  EMG/NCV 07/13/17  Nerve conduction studies done on the left upper extremity shows evidence of a mild left carpal tunnel syndrome.  Nerve conductions of both lower extremities show borderline normal/slight slowing for the peroneal and posterior tibial nerves bilaterally with borderline normal sural sensory latencies.  It is possible that there may be a very mild evolving peripheral neuropathy, clinical correlation is required.  EMG evaluation of the lower  extremities were essentially normal bilaterally, no clear evidence of an overlying lumbosacral radiculopathy is seen on either side.     ASSESSMENT: 70 year Caucasian lady with left middle cerebral artery branch infarct in September 2017 of cryptogenic etiology was doing well except for only minor word finding difficulties. She is having some mild cognitive difficulties likely related to combination of mild  depression as well as post stroke mild cognitive impairment Which appear stable. New complaint pain and discomfort at night likely Underlying lumbar radiculopathy versus diabetic neuropathy also possible.  Patient returns today for follow-up visit and overall doing well.    PLAN: -Continue Eliquis (apixaban) daily  and lipitor  for secondary stroke prevention -F/u with PCP regarding your HLD and HTN management -continue to follow up on loop recorder -start Celexa 20mg  nightly for depression - follow up with PCP regarding depression and medication medication -continue to monitor BP at home -Maintain strict control of hypertension with blood pressure goal below 130/90, diabetes with hemoglobin A1c goal below 6.5% and cholesterol with LDL cholesterol (bad cholesterol) goal below 70 mg/dL. I also advised the patient to eat a healthy diet with plenty of whole grains, cereals, fruits and vegetables, exercise regularly and maintain ideal body weight.  Follow up in 6 months or call earlier if needed  Greater than 50% of time during this 25 minute visit was spent on counseling,explanation of diagnosis of cryptogenic stroke, restless legs mild cognitive impairment, planning of further management, discussion with patient and family and coordination of care  George HughJessica Aastha Dayley, AGNP-BC  [facility] [address] [place], [place] [id number]  Phone [phone] Fax [phone] Note: This document was prepared with digital dictation and possible smart phrase  technology. Any transcriptional errors that result from this process are unintentional.

## 2017-10-20 NOTE — Patient Instructions (Addendum)
Continue Eliquis (apixaban) daily  and lipitor  for secondary stroke prevention  Follow up with Dr. Lindaann PascalHaris for follow up on Eliquis and DVT monitoring    Continue to follow up with loop recorder   Start celexa 20mg  nightly for depression - follow up with PCP regarding depression and medication management  Continue to follow up with PCP regarding hypertension and cholesterol management   Continue to monitor blood pressure at home  Maintain strict control of hypertension with blood pressure goal below 130/90, diabetes with hemoglobin A1c goal below 6.5% and cholesterol with LDL cholesterol (bad cholesterol) goal below 70 mg/dL. I also advised the patient to eat a healthy diet with plenty of whole grains, cereals, fruits and vegetables, exercise regularly and maintain ideal body weight.  Followup in the future with me in 6 months or call earlier if needed         [facility] you for [facility]. I hope we have been able to provide you high quality care today.  You may receive a patient satisfaction survey over the next few weeks. We would appreciate your feedback and comments so that we may continue to improve ourselves and the health of our patients.

## 2017-10-25 NOTE — Progress Notes (Signed)
I agree with the above plan 

## 2017-11-11 ENCOUNTER — Other Ambulatory Visit: Payer: Self-pay | Admitting: Adult Health

## 2017-11-15 ENCOUNTER — Ambulatory Visit (INDEPENDENT_AMBULATORY_CARE_PROVIDER_SITE_OTHER): Payer: 59 | Admitting: *Deleted

## 2017-11-15 DIAGNOSIS — I639 Cerebral infarction, unspecified: Secondary | ICD-10-CM | POA: Diagnosis not present

## 2017-11-15 NOTE — Progress Notes (Signed)
Carelink Summary Report / Loop Recorder 

## 2017-11-16 LAB — CUP PACEART REMOTE DEVICE CHECK
Date Time Interrogation Session: 20190625233659
MDC IDC PG IMPLANT DT: 20170920

## 2017-12-17 ENCOUNTER — Ambulatory Visit (INDEPENDENT_AMBULATORY_CARE_PROVIDER_SITE_OTHER): Payer: 59 | Admitting: *Deleted

## 2017-12-17 DIAGNOSIS — I639 Cerebral infarction, unspecified: Secondary | ICD-10-CM | POA: Diagnosis not present

## 2017-12-18 NOTE — Progress Notes (Signed)
Carelink Summary Report / Loop Recorder 

## 2017-12-27 LAB — CUP PACEART REMOTE DEVICE CHECK
Implantable Pulse Generator Implant Date: 20170920
MDC IDC SESS DTM: 20190728233745

## 2018-01-08 ENCOUNTER — Other Ambulatory Visit: Payer: Self-pay | Admitting: Adult Health

## 2018-01-10 ENCOUNTER — Other Ambulatory Visit: Payer: Self-pay

## 2018-01-10 MED ORDER — CITALOPRAM HYDROBROMIDE 20 MG PO TABS: 20.0000 mg | ORAL_TABLET | Freq: Every evening | ORAL | 3 refills | Status: DC

## 2018-01-12 LAB — CUP PACEART REMOTE DEVICE CHECK
Date Time Interrogation Session: 20190831001020
Implantable Pulse Generator Implant Date: 20170920

## 2018-01-19 ENCOUNTER — Ambulatory Visit (INDEPENDENT_AMBULATORY_CARE_PROVIDER_SITE_OTHER): Payer: 59 | Admitting: *Deleted

## 2018-01-19 DIAGNOSIS — I639 Cerebral infarction, unspecified: Secondary | ICD-10-CM | POA: Diagnosis not present

## 2018-01-20 LAB — CUP PACEART REMOTE DEVICE CHECK
MDC IDC PG IMPLANT DT: 20170920
MDC IDC SESS DTM: 20191003003933

## 2018-01-20 NOTE — Progress Notes (Signed)
Carelink Summary Report / Loop Recorder 

## 2018-02-21 ENCOUNTER — Ambulatory Visit (INDEPENDENT_AMBULATORY_CARE_PROVIDER_SITE_OTHER): Payer: 59 | Admitting: *Deleted

## 2018-02-21 DIAGNOSIS — I639 Cerebral infarction, unspecified: Secondary | ICD-10-CM

## 2018-02-22 NOTE — Progress Notes (Signed)
Carelink Summary Report / Loop Recorder 

## 2018-03-28 ENCOUNTER — Ambulatory Visit (INDEPENDENT_AMBULATORY_CARE_PROVIDER_SITE_OTHER): Payer: 59

## 2018-03-28 DIAGNOSIS — I639 Cerebral infarction, unspecified: Secondary | ICD-10-CM

## 2018-03-29 NOTE — Progress Notes (Signed)
Carelink Summary Report / Loop Recorder 

## 2018-04-16 LAB — CUP PACEART REMOTE DEVICE CHECK
Date Time Interrogation Session: 20191105004103
MDC IDC PG IMPLANT DT: 20170920

## 2018-04-26 ENCOUNTER — Ambulatory Visit: Payer: 59 | Admitting: Adult Health

## 2018-04-27 ENCOUNTER — Encounter: Payer: Self-pay | Admitting: Adult Health

## 2018-04-27 ENCOUNTER — Ambulatory Visit: Payer: 59 | Admitting: Adult Health

## 2018-04-27 VITALS — BP 102/67 | HR 58 | Wt 184.2 lb

## 2018-04-27 DIAGNOSIS — I10 Essential (primary) hypertension: Secondary | ICD-10-CM

## 2018-04-27 DIAGNOSIS — F32A Depression, unspecified: Secondary | ICD-10-CM

## 2018-04-27 DIAGNOSIS — I63312 Cerebral infarction due to thrombosis of left middle cerebral artery: Secondary | ICD-10-CM | POA: Diagnosis not present

## 2018-04-27 DIAGNOSIS — E785 Hyperlipidemia, unspecified: Secondary | ICD-10-CM | POA: Diagnosis not present

## 2018-04-27 DIAGNOSIS — F329 Major depressive disorder, single episode, unspecified: Secondary | ICD-10-CM | POA: Diagnosis not present

## 2018-04-27 MED ORDER — CITALOPRAM HYDROBROMIDE 20 MG PO TABS: 10.0000 mg | ORAL_TABLET | Freq: Every evening | ORAL | Status: DC

## 2018-04-27 NOTE — Patient Instructions (Signed)
Continue Eliquis (apixaban) daily  and lipitor  for secondary stroke prevention  Continue to follow up with PCP regarding cholesterol and blood pressure management   Continue to follow with hematology in 06/2018 for duration of eliquis  We will start to taper the celexa - decrease celexa from 20mg  daily to 10mg  for the next 2 weeks and then you can discontinue. If you do start to experience depression symptoms again, please call office   We can consider stopping the topamax once you are completely off celexa and doing well  Continue to monitor headaches and notify us if they worsen  Continue to monitor blood pressure at home  Maintain strict control of hypertension with blood pressure goal below 130/90, diabetes with hemoglobin A1c goal below 6.5% and cholesterol with LDL cholesterol (bad cholesterol) goal below 70 mg/dL. I also advised the patient to eat a healthy diet with plenty of whole grains, cereals, fruits and vegetables, exercise regularly and maintain ideal body weight.  Followup in the future with me in 6 months or call earlier if needed       Thank you for coming [facility] us at [facility]. I hope we have been able to provide you high quality care today.  You may receive a patient satisfaction survey over the next few weeks. We would appreciate your feedback and comments so that we may continue to improve ourselves and the health of our patients.

## 2018-04-27 NOTE — Progress Notes (Signed)
[facility] [address] [place]. [place] [address] [phone]       OFFICE FOLLOW-UP NOTE  Caroline. 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 She was seen and last visit byDr. Leland Johns   on 02/2116 and subsequently referred to Dr. Frances Furbish for sleep apnoea who did polysomnogram and confirmed sleep apnea. She has since been started on CPAP and is tolerating it well and has noted improvement. Return for follow-up today to see me. She is a complaint by daughter and husband. The patient states her stroke symptoms have improved significantly but she still has some word finding difficulties but equally towards the afternoon and and of the day. This is more prominent when she is tired and exhausted. 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 She states that her mind is not as sharp and it takes her longer to get things done. She does have a remote history of depression in the past but she is currently not on any medications. She states she gets tired easily and does not have the stamina to do a lot of physical work. She is tolerating Plavix well without bleeding or bruising. She states her blood pressure is well controlled today it is [id number]. She is tolerating Lipitor well without muscle aches and pains. She is using CPAP regularly and finds it helpful.. She has a loop recorder in place and so for paroxysmal atrial fibrillation has not yet been found Update 07/12/2017 : She returns for follow-up after last ultrasound on 06/12/17 and started on eliquis. She had lab work done on 07/08/17 ordered by her hematologist which showed negative anti-phospholipid antibodies and beta 2  glycoproteins..she is tolerating eliquis well but also remains on Plavix.She continues to have some word finding difficulties particularly towards the evening time or when she is tired. She admits to being under increased stress due to to her medical issues. She complains of significant pain and discomfort in her left leg at night at times she wakes up in the middle due to discomfort and pain. This may occasionally go up her leg up to her hip. She has history of degenerative cervical spine disease with history of left arm numbness in the past.She denies any new stroke or TIA symptoms.  10/20/2017 update: Patient returns today for 71-month follow-up and is accompanied by her husband.  EMG/NCS performed on 07/13/2017 and did show evidence of mild carpal tunnel in her left wrist along with very mild evolving peripheral neuropathy.  She continues to take Topamax 50 mg nightly and states this has been helping with her restless leg syndrome at night along with left wrist pain from carpal tunnel.  She has been using her left wrist brace at night and states she no longer experiences the tingling and numbness.  She continues to take Eliquis due to clot and denies bleeding or bruising.  She does have a follow-up appointment with Dr. Lindaann Pascal in September and they will review whether continuation of Eliquis or return back to Plavix.  She states approximately 3 weeks ago, she had her right index finger knuckle on her bed post and since then has been having pain along with swelling and feeling of weakness when attempting to  grab an object or make a fist.  Patient denies any radiating pain and is unsure if she has pain when attempting to use that hand to pick up any objects.  Patient has not been seen by PCP for this complaint.  Patient also recently expressed concerns of increased depression recently.  States that she has not had a problem in the past with depression but since her stroke she has had mild depression but this is only  worsened in the past few months.  Numerous situational causes at this time but patient is questioning whether she can start on depression medication.  Continues to have very mild dysarthria from prior stroke in 2017.  Loop recorder negative for atrial fibrillation thus far.  Patient continues to stay active without new or worsening stroke/TIA symptoms.  Interval history 04/27/2018: Patient is being seen today for 5-month follow-up visit and is accompanied by her husband.  She comes in today with multiple concerns including occasional occipital headache, left arm numbness/tingling, occasional right hand numbness, and decreased libido.  She states she is been experiencing occipital headaches over the past few months but are dull in characteristic and are not debilitating.  She does have a history of headaches.  She does state that these headaches come and go and will resolve without intervention.  They do not radiate down into neck or up into head.  Left arm numbness/tingling has been persistent and has had a EMG with diagnosis of carpal tunnel.  She states that she was using her brace but has stopped.  She states numbness and tingling are intermittent and do not last for long period of time and typically occur when she is doing some type of activity or in the morning upon awakening.  She has continued on Topamax 50 mg nightly.  She states she has been experiencing decreased libido over the past few months and was questioning whether this could be due to her stroke.  At prior visit, patient was started on Celexa due to depression type symptoms.  She states the Celexa did help her depression as she has not experienced any further depression/anxiety symptoms.  She did have follow-up appointment with hematologist on 12/27/2017 for possible discontinuation of Eliquis but obtain labs with d-dimer 850 therefore recommended continuation of Eliquis.  She does have follow-up appointment scheduled on 07/07/2018.  She continues on  atorvastatin without side effects myalgias.  Blood pressure today stable at 102/67.  Loop recorder has not shown atrial fibrillation thus far.  No further concerns at this time.    ROS:   14 system review of systems is positive for headache and numbness and all other systems negative  PMH:  Past Medical History:  Diagnosis Date  . CVA (cerebral vascular accident) (HCC)   . Diabetes mellitus without complication (HCC)   . Left carpal tunnel syndrome 07/13/2017  . OSA on CPAP   . Reflux     Social History:  Social History   Socioeconomic History  . Marital status: Married    Spouse name: Not on file  . Number of children: 2  . Years of education: HS  . Highest education level: Not on file  Occupational History  . Occupation: [employer]  Social Needs  . Financial resource strain: Not on file  . Food insecurity:    Worry: Not on file    Inability: Not on file  . Transportation needs:    Medical: Not on file    Non-medical: Not on file  Tobacco  Use  . Smoking status: Never Smoker  . Smokeless tobacco: Never Used  Substance and Sexual Activity  . Alcohol use: No  . Drug use: No  . Sexual activity: Not on file  Lifestyle  . Physical activity:    Days per week: Not on file    Minutes per session: Not on file  . Stress: Not on file  Relationships  . Social connections:    Talks on phone: Not on file    Gets together: Not on file    Attends religious service: Not on file    Active member of club or organization: Not on file    Attends meetings of clubs or organizations: Not on file    Relationship status: Not on file  . Intimate partner violence:    Fear of current or ex partner: Not on file    Emotionally abused: Not on file    Physically abused: Not on file    Forced sexual activity: Not on file  Other Topics Concern  . Not on file  Social History Narrative   Lives at home with her husband.   Left-handed   Occasional caffeine use.    Medications:     Current Outpatient Medications on File Prior to Visit  Medication Sig Dispense Refill  . apixaban (ELIQUIS) 2.5 MG TABS tablet Take by mouth.    Marland Kitchen. atorvastatin (LIPITOR) 10 MG tablet Take 1 tablet (10 mg total) by mouth daily at 6 PM. 30 tablet 3  . citalopram (CELEXA) 20 MG tablet Take 1 tablet (20 mg total) by mouth every evening. 30 tablet 3  . clotrimazole-betamethasone (LOTRISONE) cream as needed.     Marland Kitchen. DOCUSATE SODIUM PO Take 100 mg by mouth.    . FIBER PO Take by mouth daily.    . fluticasone (FLONASE) 50 MCG/ACT nasal spray Use 2 sprays in each nostril once daily    . losartan (COZAAR) 25 MG tablet Take 25 mg by mouth daily. as directed  0  . Melatonin 10 MG TABS Take 10 mg by mouth as needed.     . montelukast (SINGULAIR) 10 MG tablet Take 10 mg by mouth at bedtime.   11  . Multiple Vitamin (MULTIVITAMIN WITH MINERALS) TABS tablet Take 1 tablet by mouth daily.    Marland Kitchen. nystatin (MYCOSTATIN/NYSTOP) powder Apply to affected area twice daily.prn    . pantoprazole (PROTONIX) 40 MG tablet Take 40 mg by mouth daily.    Marland Kitchen. sAXagliptin-metFORMIN (KOMBIGLYZE XR PO) Take 1,000 mg by mouth daily.     Marland Kitchen. topiramate (TOPAMAX) 50 MG tablet Take 1 tablet (50 mg total) by mouth daily. Take at night only 90 tablet 4  . valACYclovir (VALTREX) 500 MG tablet Take 500 mg by mouth daily.      No current facility-administered medications on file prior to visit.     Allergies:   Allergies  Allergen Reactions  . Amoxicillin Rash  . Azithromycin Rash  . Homatropine Rash  . Hydrocodone-Homatropine Rash  . Penicillin G Rash    Dyspepsia, ingestion  . Tape Rash    Physical Exam General: well developed, well nourished pleasant middle-age Caucasian lady, seated, in no evident distress Head: head normocephalic and atraumatic.  Neck: supple with no carotid or supraclavicular bruits Cardiovascular: regular rate and rhythm, no murmurs Musculoskeletal: no deformity Skin:  no rash/petichiae Vascular:   Normal pulses all extremities Vitals:   04/27/18 1455  BP: 102/67  Pulse: (!) 58   Neurologic Exam Mental Status: Awake  and fully alert. Oriented to place and time. Recent and remote memory intact. Attention span, concentration and fund of knowledge appropriate. Mood and affect appropriate. Speech mostly fluent with only occasional word finding difficulties.  Number Cranial Nerves: Fundoscopic exam reveals sharp disc margins. Pupils equal, briskly reactive to light. Extraocular movements full without nystagmus. Visual fields full to confrontation. Hearing intact. Facial sensation intact. Face, tongue, palate moves normally and symmetrically.  Motor: Normal bulk and tone. Normal strength in all tested extremity muscles. Sensory.: intact to touch ,pinprick .position and vibratory sensation.  Coordination: Rapid alternating movements normal in all extremities. Finger-to-nose and heel-to-shin performed accurately bilaterally. Gait and Station: Arises from chair without difficulty. Stance is normal. Gait demonstrates normal stride length and balance . Able to heel, toe and tandem walk without difficulty.  Reflexes: 1+ and symmetric except left ankle jerk is depressed. Toes downgoing.     IMAGING:   and then reviewing all her medications she is on taking anything currently  EMG/NCV 07/13/17  Nerve conduction studies done on the left upper extremity shows evidence of a mild left carpal tunnel syndrome.  Nerve conductions of both lower extremities show borderline normal/slight slowing for the peroneal and posterior tibial nerves bilaterally with borderline normal sural sensory latencies.  It is possible that there may be a very mild evolving peripheral neuropathy, clinical correlation is required.  EMG evaluation of the lower extremities were essentially normal bilaterally, no clear evidence of an overlying lumbosacral radiculopathy is seen on either side.     ASSESSMENT: [age] year Caucasian lady  with left middle cerebral artery branch infarct in September 2017 of cryptogenic etiology was doing well except for only minor word finding difficulties. She is having some mild cognitive difficulties likely related to combination of mild depression as well as post stroke mild cognitive impairment Which appear stable.  She is being seen today for follow-up visit with complaints of occasional abdominal occipital headache, continued left arm numbness and decreased libido.    PLAN: -Continue Eliquis (apixaban) daily  and lipitor  for secondary stroke prevention -As depression stable, recommended tapering off from Celexa as this could be causing the decreased libido.  Advised patient that if she starts to experience depression-like symptoms in the future, to notify office and we can consider initiating a different antidepressant at that time. -Patient questioning whether she can discontinue Topamax at this time.  She was originally prescribed this for restless leg syndrome and neuropathic pain.  Advised her that we will discontinue Celexa first and then we can consider discontinuing Topamax -F/u with PCP regarding your HLD and HTN management -continue to follow up on loop recorder -As patient is neurologically intact and headaches are only on occasion, advised her to continue to monitor for now and use Tylenol as needed.  Did advise her that she starts to experience any type of neck pain or radiating head pain or any other neurological changes to proceed to ED for further evaluation -f/u with hematology on 06/2018 for continuation of Eliquis -continue to monitor BP at home -Maintain strict control of hypertension with blood pressure goal below 130/90, diabetes with hemoglobin A1c goal below 6.5% and cholesterol with LDL cholesterol (bad cholesterol) goal below 70 mg/dL. I also advised the patient to eat a healthy diet with plenty of whole grains, cereals, fruits and vegetables, exercise regularly and  maintain ideal body weight.  Follow up in 6 months or call earlier if needed  Greater than 50% of time during this 25 minute  visit was spent on counseling,explanation of diagnosis of cryptogenic stroke, restless legs mild cognitive impairment, planning of further management, discussion with patient and family and coordination of care  George Hugh, [facility]  [facility] [address] [place], [place] [address]  Phone [phone] Fax [phone] Note: This document was prepared with digital dictation and possible smart phrase technology. Any transcriptional errors that result from this process are unintentional.

## 2018-04-28 ENCOUNTER — Ambulatory Visit (INDEPENDENT_AMBULATORY_CARE_PROVIDER_SITE_OTHER): Payer: 59

## 2018-04-28 DIAGNOSIS — I639 Cerebral infarction, unspecified: Secondary | ICD-10-CM

## 2018-04-29 NOTE — Progress Notes (Signed)
Carelink Summary Report / Loop Recorder 

## 2018-04-30 LAB — CUP PACEART REMOTE DEVICE CHECK
Date Time Interrogation Session: 20200110013832
Implantable Pulse Generator Implant Date: 20170920

## 2018-05-06 NOTE — Progress Notes (Signed)
I agree with the above plan 

## 2018-05-08 LAB — CUP PACEART REMOTE DEVICE CHECK
Implantable Pulse Generator Implant Date: 20170920
MDC IDC SESS DTM: 20191208013808

## 2018-05-31 ENCOUNTER — Ambulatory Visit (INDEPENDENT_AMBULATORY_CARE_PROVIDER_SITE_OTHER): Payer: 59

## 2018-05-31 DIAGNOSIS — I639 Cerebral infarction, unspecified: Secondary | ICD-10-CM

## 2018-06-01 LAB — CUP PACEART REMOTE DEVICE CHECK
Date Time Interrogation Session: 20200212013602
Implantable Pulse Generator Implant Date: 20170920

## 2018-06-13 NOTE — Progress Notes (Signed)
Carelink Summary Report / Loop Recorder 

## 2018-07-04 ENCOUNTER — Other Ambulatory Visit: Payer: Self-pay

## 2018-07-04 ENCOUNTER — Ambulatory Visit (INDEPENDENT_AMBULATORY_CARE_PROVIDER_SITE_OTHER): Payer: 59 | Admitting: *Deleted

## 2018-07-04 DIAGNOSIS — I639 Cerebral infarction, unspecified: Secondary | ICD-10-CM | POA: Diagnosis not present

## 2018-07-05 LAB — CUP PACEART REMOTE DEVICE CHECK
Date Time Interrogation Session: 20200316020818
Implantable Pulse Generator Implant Date: 20170920

## 2018-07-12 NOTE — Progress Notes (Signed)
Carelink Summary Report / Loop Recorder 

## 2018-08-01 ENCOUNTER — Encounter: Payer: Self-pay | Admitting: Adult Health

## 2018-08-01 ENCOUNTER — Telehealth: Payer: Self-pay | Admitting: Adult Health

## 2018-08-01 NOTE — Telephone Encounter (Signed)
Pt called in and stated her right leg is aching so bad it feels as if its going to fall off, she states it hurts if reclining back or sitting at a regular chair. She states she takes 2.5mg  eliquis  po 2 x day

## 2018-08-05 ENCOUNTER — Ambulatory Visit (INDEPENDENT_AMBULATORY_CARE_PROVIDER_SITE_OTHER): Payer: 59 | Admitting: *Deleted

## 2018-08-05 ENCOUNTER — Other Ambulatory Visit: Payer: Self-pay

## 2018-08-05 DIAGNOSIS — I639 Cerebral infarction, unspecified: Secondary | ICD-10-CM

## 2018-08-06 LAB — CUP PACEART REMOTE DEVICE CHECK
Date Time Interrogation Session: 20200418024034
Implantable Pulse Generator Implant Date: 20170920

## 2018-08-10 NOTE — Progress Notes (Signed)
Carelink Summary Report / Loop Recorder 

## 2018-09-07 ENCOUNTER — Ambulatory Visit (INDEPENDENT_AMBULATORY_CARE_PROVIDER_SITE_OTHER): Payer: 59 | Admitting: *Deleted

## 2018-09-07 DIAGNOSIS — I639 Cerebral infarction, unspecified: Secondary | ICD-10-CM | POA: Diagnosis not present

## 2018-09-08 LAB — CUP PACEART REMOTE DEVICE CHECK
Date Time Interrogation Session: 20200521023557
Implantable Pulse Generator Implant Date: 20170920

## 2018-09-16 NOTE — Progress Notes (Signed)
Carelink Summary Report / Loop Recorder 

## 2018-10-10 ENCOUNTER — Ambulatory Visit (INDEPENDENT_AMBULATORY_CARE_PROVIDER_SITE_OTHER): Payer: 59 | Admitting: *Deleted

## 2018-10-10 DIAGNOSIS — I63312 Cerebral infarction due to thrombosis of left middle cerebral artery: Secondary | ICD-10-CM | POA: Diagnosis not present

## 2018-10-11 LAB — CUP PACEART REMOTE DEVICE CHECK
Date Time Interrogation Session: 20200623074343
Implantable Pulse Generator Implant Date: 20170920

## 2018-10-18 ENCOUNTER — Telehealth: Payer: Self-pay | Admitting: Adult Health

## 2018-10-18 NOTE — Telephone Encounter (Signed)
I called patient and LVM regarding rescheduling her 7/8 appt with Janett Billow NP due to Janett Billow being out of office. Requested patient call back to reschedule.

## 2018-10-19 NOTE — Progress Notes (Signed)
Carelink Summary Report / Loop Recorder 

## 2018-10-25 ENCOUNTER — Telehealth: Payer: Self-pay

## 2018-10-25 NOTE — Telephone Encounter (Signed)
Called pt regarding LINQ tachy alert. Pt states she was working outside and does not recall having any symptoms.1 tachy alert, 28 second duration, max V rate 176 bpm. Available ECG suggest SVT.

## 2018-10-26 ENCOUNTER — Ambulatory Visit: Payer: 59 | Admitting: Adult Health

## 2018-10-27 NOTE — Telephone Encounter (Signed)
Follow up   Patient has additional questions per the previous message about her transmission. Please call.

## 2018-10-27 NOTE — Telephone Encounter (Signed)
Pt called back to clarify that she was very stressed out and upset with someone on 10/18/2018 and had palpitations.  No correlated event on her device. Questions answered. Pt knows to let us know if she has more symptoms.    Caroline [name] [address]" [place], PA-C 10/27/2018 4:45 PM

## 2018-11-13 LAB — CUP PACEART REMOTE DEVICE CHECK
Date Time Interrogation Session: 20200726095030
Implantable Pulse Generator Implant Date: 20170920

## 2018-11-14 ENCOUNTER — Ambulatory Visit (INDEPENDENT_AMBULATORY_CARE_PROVIDER_SITE_OTHER): Payer: Medicare HMO | Admitting: *Deleted

## 2018-11-14 DIAGNOSIS — I63312 Cerebral infarction due to thrombosis of left middle cerebral artery: Secondary | ICD-10-CM

## 2018-11-30 NOTE — Progress Notes (Signed)
Carelink Summary Report / Loop Recorder 

## 2018-12-16 ENCOUNTER — Ambulatory Visit (INDEPENDENT_AMBULATORY_CARE_PROVIDER_SITE_OTHER): Payer: Medicare HMO | Admitting: *Deleted

## 2018-12-16 DIAGNOSIS — I63312 Cerebral infarction due to thrombosis of left middle cerebral artery: Secondary | ICD-10-CM | POA: Diagnosis not present

## 2018-12-16 LAB — CUP PACEART REMOTE DEVICE CHECK
Date Time Interrogation Session: 20200828110753
Implantable Pulse Generator Implant Date: 20170920

## 2018-12-20 NOTE — Progress Notes (Signed)
Carelink Summary Report / Loop Recorder 

## 2019-01-18 ENCOUNTER — Ambulatory Visit (INDEPENDENT_AMBULATORY_CARE_PROVIDER_SITE_OTHER): Payer: Medicare HMO | Admitting: *Deleted

## 2019-01-18 DIAGNOSIS — I63312 Cerebral infarction due to thrombosis of left middle cerebral artery: Secondary | ICD-10-CM

## 2019-01-18 LAB — CUP PACEART REMOTE DEVICE CHECK
Date Time Interrogation Session: 20200930114441
Implantable Pulse Generator Implant Date: 20170920

## 2019-01-19 ENCOUNTER — Encounter

## 2019-01-19 ENCOUNTER — Ambulatory Visit: Payer: Medicare HMO | Admitting: Adult Health

## 2019-01-19 ENCOUNTER — Other Ambulatory Visit: Payer: Self-pay

## 2019-01-19 ENCOUNTER — Encounter: Payer: Self-pay | Admitting: Adult Health

## 2019-01-19 VITALS — BP 109/71 | HR 73 | Temp 97.1°F | Wt 191.4 lb

## 2019-01-19 DIAGNOSIS — G2581 Restless legs syndrome: Secondary | ICD-10-CM

## 2019-01-19 DIAGNOSIS — I10 Essential (primary) hypertension: Secondary | ICD-10-CM

## 2019-01-19 DIAGNOSIS — E785 Hyperlipidemia, unspecified: Secondary | ICD-10-CM

## 2019-01-19 DIAGNOSIS — Z8673 Personal history of transient ischemic attack (TIA), and cerebral infarction without residual deficits: Secondary | ICD-10-CM | POA: Diagnosis not present

## 2019-01-19 DIAGNOSIS — G5602 Carpal tunnel syndrome, left upper limb: Secondary | ICD-10-CM | POA: Diagnosis not present

## 2019-01-19 NOTE — Progress Notes (Signed)
[facility] [address] [place]. [place] [id number] [phone]       OFFICE FOLLOW-UP NOTE  Caroline. 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 Caroline [name] was seen and last visit byDr. Leland JohnsAihern   on 02/2116 and subsequently referred to Dr. Frances FurbishAthar for sleep apnoea who did polysomnogram and confirmed sleep apnea. Caroline [name] has since been started on CPAP and is tolerating it well and has noted improvement. Return for follow-up today to see me. Caroline [name] is a complaint by daughter and husband. The patient states her stroke symptoms have improved significantly but Caroline [name] still has some word finding difficulties but equally towards the afternoon and and of the day. This is more prominent when Caroline [name] is tired and exhausted. 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 Caroline [name] states that her mind is not as sharp and it takes her longer to get things done. Caroline [name] does have a remote history of depression in the past but Caroline [name] is currently not on any medications. Caroline [name] states Caroline [name] gets tired easily and does not have the stamina to do a lot of physical work. Caroline [name] is tolerating Plavix well without bleeding or bruising. Caroline [name] states her blood pressure is well controlled today it is [id number]. Caroline [name] is tolerating Lipitor well without muscle aches and pains. Caroline [name] is using CPAP regularly and finds it helpful.. Caroline [name] has a loop recorder in place and so for paroxysmal atrial fibrillation has not yet been found Update 07/12/2017 : Caroline [name] returns for follow-up after last ultrasound on 06/12/17 and started on eliquis. Caroline [name] had lab work done on 07/08/17 ordered by her hematologist which showed negative anti-phospholipid antibodies and beta 2  glycoproteins..Caroline [name] is tolerating eliquis well but also remains on Plavix.Caroline [name] continues to have some word finding difficulties particularly towards the evening time or when Caroline [name] is tired. Caroline [name] admits to being under increased stress due to to her medical issues. Caroline [name] complains of significant pain and discomfort in her left leg at night at times Caroline [name] wakes up in the middle due to discomfort and pain. This may occasionally go up her leg up to her hip. Caroline [name] has history of degenerative cervical spine disease with history of left arm numbness in the past.Caroline [name] denies any new stroke or TIA symptoms.  10/20/2017 update: Patient returns today for 788-month follow-up and is accompanied by her husband.  EMG/NCS performed on 07/13/2017 and did show evidence of mild carpal tunnel in her left wrist along with very mild evolving peripheral neuropathy.  Caroline [name] continues to take Topamax 50 mg nightly and states this has been helping with her restless leg syndrome at night along with left wrist pain from carpal tunnel.  Caroline [name] has been using her left wrist brace at night and states Caroline [name] no longer experiences the tingling and numbness.  Caroline [name] continues to take Eliquis due to clot and denies bleeding or bruising.  Caroline [name] does have a follow-up appointment with Dr. Lindaann PascalHaris in September and they will review whether continuation of Eliquis or return back to Plavix.  Caroline [name] states approximately 3 weeks ago, Caroline [name] had her right index finger knuckle on her bed post and since then has been having pain along with swelling and feeling of weakness when attempting to  grab an object or make a fist.  Patient denies any radiating pain and is unsure if Caroline [name] has pain when attempting to use that hand to pick up any objects.  Patient has not been seen by PCP for this complaint.  Patient also recently expressed concerns of increased depression recently.  States that Caroline [name] has not had a problem in the past with depression but since her stroke Caroline [name] has had mild depression but this is only  worsened in the past few months.  Numerous situational causes at this time but patient is questioning whether Caroline [name] can start on depression medication.  Continues to have very mild dysarthria from prior stroke in 2017.  Loop recorder negative for atrial fibrillation thus far.  Patient continues to stay active without new or worsening stroke/TIA symptoms.  Interval history 04/27/2018: Patient is being seen today for 87-month follow-up visit and is accompanied by her husband.  Caroline [name] comes in today with multiple concerns including occasional occipital headache, left arm numbness/tingling, occasional right hand numbness, and decreased libido.  Caroline [name] states Caroline [name] is been experiencing occipital headaches over the past few months but are dull in characteristic and are not debilitating.  Caroline [name] does have a history of headaches.  Caroline [name] does state that these headaches come and go and will resolve without intervention.  They do not radiate down into neck or up into head.  Left arm numbness/tingling has been persistent and has had a EMG with diagnosis of carpal tunnel.  Caroline [name] states that Caroline [name] was using her brace but has stopped.  Caroline [name] states numbness and tingling are intermittent and do not last for long period of time and typically occur when Caroline [name] is doing some type of activity or in the morning upon awakening.  Caroline [name] has continued on Topamax 50 mg nightly.  Caroline [name] states Caroline [name] has been experiencing decreased libido over the past few months and was questioning whether this could be due to her stroke.  At prior visit, patient was started on Celexa due to depression type symptoms.  Caroline [name] states the Celexa did help her depression as Caroline [name] has not experienced any further depression/anxiety symptoms.  Caroline [name] did have follow-up appointment with hematologist on 12/27/2017 for possible discontinuation of Eliquis but obtain labs with d-dimer 850 therefore recommended continuation of Eliquis.  Caroline [name] does have follow-up appointment scheduled on 07/07/2018.  Caroline [name] continues on  atorvastatin without side effects myalgias.  Blood pressure today stable at 102/67.  Loop recorder has not shown atrial fibrillation thus far.  No further concerns at this time.  Update 01/19/2019: Caroline [name] is being seen today for stroke follow-up accompanied by her daughter.  Since prior visit, Caroline [name] has discontinued use of Topamax and Celexa.  Caroline [name] intermittently has nerve pain or worsening RLS but overall has been stable.  Denies worsening depression on Celexa.  Caroline [name] continues on Eliquis without bleeding or bruising.  Continues to follow with hematology for ongoing monitoring and management with potential need of lifelong therapy.  Continues on atorvastatin without myalgias.  Blood pressure today 109/71.  Loop recorder has not shown atrial fibrillation thus far.  Caroline [name] questions ongoing use of monitor as it has been greater than 3 years.  Denies new or worsening stroke/TIA symptoms.   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  Social Network engineer strain: Not on file   Food insecurity    Worry: Not on file    Inability: Not on file   Transportation needs    Medical: Not on file    Non-medical: Not on file  Tobacco Use   Smoking status: Never Smoker   Smokeless tobacco: Never Used  Substance and Sexual Activity   Alcohol use: No   Drug use: No   Sexual activity: Not on file  Lifestyle   Physical activity    Days per week: Not on file    Minutes per  session: Not on file   Stress: Not on file  Relationships   Social connections    Talks on phone: Not on file    Gets together: Not on file    Attends religious service: Not on file    Active member of club or organization: Not on file    Attends meetings of clubs or organizations: Not on file    Relationship status: Not on file   Intimate partner violence    Fear of current or ex partner: Not on file    Emotionally abused: Not on file    Physically abused: Not on file    Forced sexual activity: Not on file  Other Topics Concern   Not on file  Social History Narrative   Lives at home with her husband.   Left-handed   Occasional caffeine use.    Medications:   Current Outpatient Medications on File Prior to Visit  Medication Sig Dispense Refill   apixaban (ELIQUIS) 2.5 MG TABS tablet Take by mouth.     atorvastatin (LIPITOR) 10 MG tablet Take 1 tablet (10 mg total) by mouth daily at 6 PM. 30 tablet 3   citalopram (CELEXA) 20 MG tablet Take 0.5 tablets (10 mg total) by mouth every evening for 14 days. 7 tablet    clotrimazole-betamethasone (LOTRISONE) cream as needed.      DOCUSATE SODIUM PO Take 100 mg by mouth.     FIBER PO Take by mouth daily.     fluticasone (FLONASE) 50 MCG/ACT nasal spray Use 2 sprays in each nostril once daily     losartan (COZAAR) 25 MG tablet Take 25 mg by mouth daily. as directed  0   Melatonin 10 MG TABS Take 10 mg by mouth as needed.      montelukast (SINGULAIR) 10 MG tablet Take 10 mg by mouth at bedtime.   11   Multiple Vitamin (MULTIVITAMIN WITH MINERALS) TABS tablet Take 1 tablet by mouth daily.     nystatin (MYCOSTATIN/NYSTOP) powder Apply to affected area twice daily.prn     pantoprazole (PROTONIX) 40 MG tablet Take 40 mg by mouth daily.     sAXagliptin-metFORMIN (KOMBIGLYZE XR PO) Take 1,000 mg by mouth daily.      topiramate (TOPAMAX) 50 MG tablet Take 1 tablet (50 mg total) by mouth daily. Take at night only 90 tablet 4     valACYclovir (VALTREX) 500 MG tablet Take 500 mg by mouth daily.      No current facility-administered medications  on file prior to visit.     Allergies:   Allergies  Allergen Reactions   Amoxicillin Rash   Azithromycin Rash   Homatropine Rash   Hydrocodone-Homatropine Rash   Penicillin G Rash    Dyspepsia, ingestion   Tape Rash    Physical Exam  Today's Vitals   01/19/19 1450  BP: 109/71  Pulse: 73  Temp: (!) 97.1 F (36.2 C)  Weight: 191 lb 6.4 oz (86.8 kg)   Body mass index is 33.9 kg/m.   General: well developed, well nourished pleasant middle-age Caucasian lady, seated, in no evident distress Head: head normocephalic and atraumatic.  Neck: supple with no carotid or supraclavicular bruits Cardiovascular: regular rate and rhythm, no murmurs Musculoskeletal: no deformity Skin:  no rash/petichiae Vascular:  Normal pulses all extremities  Neurologic Exam Mental Status: Awake and fully alert. Oriented to place and time. Recent and remote memory intact. Attention span, concentration and fund of knowledge appropriate. Mood and affect appropriate. Speech mostly fluent with only occasional word finding difficulties.  Number Cranial Nerves: Fundoscopic exam reveals sharp disc margins. Pupils equal, briskly reactive to light. Extraocular movements full without nystagmus. Visual fields full to confrontation. Hearing intact. Facial sensation intact. Face, tongue, palate moves normally and symmetrically.  Motor: Normal bulk and tone. Normal strength in all tested extremity muscles. Sensory.: intact to touch ,pinprick .position and vibratory sensation.  Coordination: Rapid alternating movements normal in all extremities. Finger-to-nose and heel-to-shin performed accurately bilaterally. Gait and Station: Arises from chair without difficulty. Stance is normal. Gait demonstrates normal stride length and balance .  Reflexes: 1+ and symmetric.  Toes downgoing.      EMG/NCV 07/13/17  Nerve conduction studies done on the left upper extremity shows evidence of a mild left carpal tunnel syndrome.  Nerve conductions of both lower extremities show borderline normal/slight slowing for the peroneal and posterior tibial nerves bilaterally with borderline normal sural sensory latencies.  It is possible that there may be a very mild evolving peripheral neuropathy, clinical correlation is required.  EMG evaluation of the lower extremities were essentially normal bilaterally, no clear evidence of an overlying lumbosacral radiculopathy is seen on either side.     ASSESSMENT: 46 year Caucasian lady with left middle cerebral artery branch infarct in September 2017 of cryptogenic etiology was doing well except for only minor word finding difficulties.  Loop recorder has been placed for greater than 3 years at this time not shown atrial fibrillation thus far.  Caroline [name] has been stable from a stroke standpoint without any new or worsening stroke/TIA symptoms.    PLAN: -Continue Eliquis (apixaban) daily  and lipitor  for secondary stroke prevention -F/u with PCP regarding your HLD and HTN management -continue to follow up on loop recorder -advised to contact cardiology office for ongoing monitoring versus removal -RLS, carpal tunnel syndrome: Has been stable and advised patient to continue to monitor -f/u with hematology as scheduled for ongoing monitoring and management with ongoing use of Eliquis -Ongoing follow-up with PCP for monitoring of worsening/unmanageable depression and anxiety -continue to monitor BP at home -Maintain strict control of hypertension with blood pressure goal below 130/90, diabetes with hemoglobin A1c goal below 6.5% and cholesterol with LDL cholesterol (bad cholesterol) goal below 70 mg/dL. I also advised the patient to eat a healthy diet with plenty of whole grains, cereals, fruits and vegetables, exercise regularly and maintain ideal body  weight.  Overall stable from stroke standpoint recommend follow-up as needed  Greater than 50% of time  during this 25 minute visit was spent on counseling,explanation of diagnosis of cryptogenic stroke, restless legs mild cognitive impairment, planning of further management, discussion with patient and family and coordination of care  Ihor Austin, [facility]  [facility] [address] [place], [place] [address]  Phone [phone] Fax [phone] Note: This document was prepared with digital dictation and possible smart phrase technology. Any transcriptional errors that result from this process are unintentional.

## 2019-01-19 NOTE — Patient Instructions (Signed)
Continue Eliquis (apixaban) daily  and lipitor  for secondary stroke prevention  Continue to follow up with PCP regarding cholesterol and blood pressure management   Continue to follow with hematologist as scheduled   Continue to monitor blood pressure at home  Maintain strict control of hypertension with blood pressure goal below 130/90, diabetes with hemoglobin A1c goal below 6.5% and cholesterol with LDL cholesterol (bad cholesterol) goal below 70 mg/dL. I also advised the patient to eat a healthy diet with plenty of whole grains, cereals, fruits and vegetables, exercise regularly and maintain ideal body weight.        Thank you for coming to see [place] at [facility]. I hope we have been able to provide you high quality care today.  You may receive a patient satisfaction survey over the next few weeks. We would appreciate your feedback and comments so that we may continue to improve ourselves and the health of our patients.

## 2019-01-20 NOTE — Progress Notes (Signed)
I agree with the above plan 

## 2019-01-24 NOTE — Progress Notes (Signed)
Carelink Summary Report / Loop Recorder 

## 2019-02-02 ENCOUNTER — Encounter: Payer: Self-pay | Admitting: Adult Health

## 2019-02-20 ENCOUNTER — Ambulatory Visit (INDEPENDENT_AMBULATORY_CARE_PROVIDER_SITE_OTHER): Payer: Medicare HMO | Admitting: *Deleted

## 2019-02-20 DIAGNOSIS — I63312 Cerebral infarction due to thrombosis of left middle cerebral artery: Secondary | ICD-10-CM | POA: Diagnosis not present

## 2019-02-20 LAB — CUP PACEART REMOTE DEVICE CHECK
Date Time Interrogation Session: 20201102083817
Implantable Pulse Generator Implant Date: 20170920

## 2019-03-15 NOTE — Progress Notes (Signed)
Carelink Summary Report / Loop Recorder 

## 2019-03-27 ENCOUNTER — Ambulatory Visit (INDEPENDENT_AMBULATORY_CARE_PROVIDER_SITE_OTHER): Payer: Medicare HMO | Admitting: *Deleted

## 2019-03-27 DIAGNOSIS — I63312 Cerebral infarction due to thrombosis of left middle cerebral artery: Secondary | ICD-10-CM

## 2019-03-27 LAB — CUP PACEART REMOTE DEVICE CHECK
Date Time Interrogation Session: 20201207093332
Implantable Pulse Generator Implant Date: 20170920

## 2019-04-23 NOTE — Progress Notes (Signed)
ILR remote 

## 2019-04-25 ENCOUNTER — Telehealth: Payer: Self-pay | Admitting: *Deleted

## 2019-04-25 NOTE — Telephone Encounter (Signed)
I let the pt know her battery still good. I told her sometimes it just goes a little longer than usual.  I let her know once we get the Alert from her monitor that she has reached RRT we will give her a call. Once the battery reached RRT we will make an appointment with the doctor and if she choose to have it removed the doctor can remove it in the office. She asked will she need another loop? I told her that is something her and the doctor can discuss.

## 2019-04-25 NOTE — Telephone Encounter (Signed)
New Message    Pt is calling and says she has had her device for over 3 years now and is wanting to know about the battery. She Is also wondering about getting it removed     Please call

## 2019-04-28 ENCOUNTER — Ambulatory Visit (INDEPENDENT_AMBULATORY_CARE_PROVIDER_SITE_OTHER): Payer: Medicare HMO | Admitting: *Deleted

## 2019-04-28 DIAGNOSIS — I6602 Occlusion and stenosis of left middle cerebral artery: Secondary | ICD-10-CM | POA: Diagnosis not present

## 2019-04-30 LAB — CUP PACEART REMOTE DEVICE CHECK
Date Time Interrogation Session: 20210109093452
Implantable Pulse Generator Implant Date: 20170920

## 2019-05-29 ENCOUNTER — Ambulatory Visit (INDEPENDENT_AMBULATORY_CARE_PROVIDER_SITE_OTHER): Payer: Medicare HMO | Admitting: *Deleted

## 2019-05-29 DIAGNOSIS — I6602 Occlusion and stenosis of left middle cerebral artery: Secondary | ICD-10-CM | POA: Diagnosis not present

## 2019-05-29 LAB — CUP PACEART REMOTE DEVICE CHECK
Date Time Interrogation Session: 20210207231439
Implantable Pulse Generator Implant Date: 20170920

## 2019-05-30 NOTE — Progress Notes (Signed)
ILR Remote 

## 2019-06-28 LAB — CUP PACEART REMOTE DEVICE CHECK
Date Time Interrogation Session: 20210310235731
Implantable Pulse Generator Implant Date: 20170920

## 2019-06-29 ENCOUNTER — Ambulatory Visit (INDEPENDENT_AMBULATORY_CARE_PROVIDER_SITE_OTHER): Payer: Medicare HMO | Admitting: *Deleted

## 2019-06-29 DIAGNOSIS — I6602 Occlusion and stenosis of left middle cerebral artery: Secondary | ICD-10-CM | POA: Diagnosis not present

## 2019-06-29 NOTE — Progress Notes (Signed)
ILR Remote 

## 2019-07-31 ENCOUNTER — Ambulatory Visit (INDEPENDENT_AMBULATORY_CARE_PROVIDER_SITE_OTHER): Payer: Medicare HMO | Admitting: *Deleted

## 2019-07-31 DIAGNOSIS — I6602 Occlusion and stenosis of left middle cerebral artery: Secondary | ICD-10-CM

## 2019-07-31 LAB — CUP PACEART REMOTE DEVICE CHECK
Date Time Interrogation Session: 20210411024853
Implantable Pulse Generator Implant Date: 20170920

## 2019-08-01 NOTE — Progress Notes (Signed)
ILR Remote 

## 2019-08-31 ENCOUNTER — Ambulatory Visit (INDEPENDENT_AMBULATORY_CARE_PROVIDER_SITE_OTHER): Payer: Medicare HMO | Admitting: *Deleted

## 2019-08-31 DIAGNOSIS — I63312 Cerebral infarction due to thrombosis of left middle cerebral artery: Secondary | ICD-10-CM

## 2019-09-02 LAB — CUP PACEART REMOTE DEVICE CHECK
Date Time Interrogation Session: 20210512025316
Implantable Pulse Generator Implant Date: 20170920

## 2019-09-04 NOTE — Progress Notes (Signed)
Carelink Summary Report / Loop Recorder 

## 2019-09-21 ENCOUNTER — Telehealth: Payer: Self-pay | Admitting: Internal Medicine

## 2019-09-21 NOTE — Telephone Encounter (Signed)
Patient wanted to check the status of the battery in her loop recorder. She thought the battery died in October 08, 2020but she is still getting charges from her insurance company about remote transmissions.  The patient wanted to know if the battery was indeed dead if she could unplug the phone device that sends the transmissions.   If she is allowed to unplug the device she also would like to know what to do with the device (throw it away, mail it back, keep it, etc.)

## 2019-09-22 NOTE — Telephone Encounter (Signed)
I let the pt know that her battery still say okay. I told her sometimes the loop recorders last longer than 3 years. I told her once we get the alert that says her battery reached RRT than she will get a call from the nurse to let her know that she can stop being monitored. The pt verbalized understanding and thanked me for the call.

## 2019-10-02 ENCOUNTER — Telehealth: Payer: Self-pay

## 2019-10-02 NOTE — Telephone Encounter (Signed)
[facility]  Alert received  10/01/19 that LINQ battery has reached RRT. Called patient, states she would like to have LINQ removed. Informed her I will send her information to the scheduling dept. And they will call her to schedule. Patients address verified and information discussed on how to send back home monitor. Patient advised to call DC back if she has further questions or concerns at [phone]. Verbalizes understanding.

## 2020-01-03 ENCOUNTER — Ambulatory Visit: Payer: Medicare HMO | Admitting: Internal Medicine

## 2020-01-03 ENCOUNTER — Encounter: Payer: Self-pay | Admitting: Internal Medicine

## 2020-01-03 ENCOUNTER — Other Ambulatory Visit: Payer: Self-pay

## 2020-01-03 VITALS — BP 144/82 | HR 76 | Ht 63.0 in | Wt 194.0 lb

## 2020-01-03 DIAGNOSIS — I63312 Cerebral infarction due to thrombosis of left middle cerebral artery: Secondary | ICD-10-CM | POA: Diagnosis not present

## 2020-01-03 DIAGNOSIS — I10 Essential (primary) hypertension: Secondary | ICD-10-CM | POA: Diagnosis not present

## 2020-01-03 NOTE — Patient Instructions (Signed)
Medication Instructions:  Your physician recommends that you continue on your current medications as directed. Please refer to the Current Medication list given to you today.  Labwork: None ordered.  Testing/Procedures: None ordered.  Follow-Up:  Your physician wants you to follow-up in: as needed with Dr. Taylor   Implantable Loop Recorder Removal, Care After This sheet gives you information about how to care for yourself after your procedure. Your health care provider may also give you more specific instructions. If you have problems or questions, contact your health care provider. What can I expect after the procedure? After the procedure, it is common to have:  Soreness or discomfort near the incision.  Some swelling or bruising near the incision.  Follow these instructions at home: Incision care  1.  Leave your outer dressing on for 72 hours.  After 72 hours you can remove your outer dressing and shower. 2. Leave adhesive strips in place. These skin closures may need to stay in place for 1-2 weeks. If adhesive strip edges start to loosen and curl up, you may trim the loose edges.  You may remove the strips if they have not fallen off after 2 weeks. 3. Check your incision area every day for signs of infection. Check for: a. Redness, swelling, or pain. b. Fluid or blood. c. Warmth. d. Pus or a bad smell. 4. Do not take baths, swim, or use a hot tub until your incision is completely healed. 5. If your wound site starts to bleed apply pressure.      If you have any questions/concerns please call the device clinic at [phone].  Activity  Return to your normal activities.  Contact a health care provider if:  You have redness, swelling, or pain around your incision.  You have a fever.  

## 2020-01-03 NOTE — Progress Notes (Signed)
HPI Ms. Schmuck returns today after a long absence from our EP clinic. She is a pleasant 64 yo woman with a cryptogenic stroke, who underwent ILR insertion. She has reached RRT. She presents for ILR removal. She denies chest pain, sob, or palpitations.  Allergies  Allergen Reactions   Amoxicillin Rash   Azithromycin Rash   Homatropine Rash   Hydrocodone-Homatropine Rash   Penicillin G Rash    Dyspepsia, ingestion   Tape Rash     Current Outpatient Medications  Medication Sig Dispense Refill   apixaban (ELIQUIS) 2.5 MG TABS tablet Take 2.5 mg by mouth 2 (two) times daily.     Ascorbic Acid (VITAMIN C GUMMIES PO) Take 1,000 mg by mouth daily.      atorvastatin (LIPITOR) 10 MG tablet Take 1 tablet (10 mg total) by mouth daily at 6 PM. 30 tablet 3   clotrimazole-betamethasone (LOTRISONE) cream Apply 1 application topically as needed (itching).      DOCUSATE SODIUM PO Take 100 mg by mouth daily.      famotidine (PEPCID) 20 MG tablet Take 1 tablet by mouth daily.     fluticasone (FLONASE) 50 MCG/ACT nasal spray Use 2 sprays in each nostril once daily     glipiZIDE (GLUCOTROL) 10 MG tablet Take 1 tablet by mouth in the morning and at bedtime.     losartan (COZAAR) 25 MG tablet Take 25 mg by mouth daily. as directed  0   Melatonin 10 MG TABS Take 10 mg by mouth as needed (sleep).      montelukast (SINGULAIR) 10 MG tablet Take 10 mg by mouth at bedtime.   11   Multiple Vitamin (MULTIVITAMIN WITH MINERALS) TABS tablet Take 1 tablet by mouth daily.     Multiple Vitamins-Minerals (MULTIVITAMIN ADULTS PO) Take 1 tablet by mouth daily.      nystatin (MYCOSTATIN/NYSTOP) powder Apply to affected area twice daily.prn     pantoprazole (PROTONIX) 40 MG tablet Take 40 mg by mouth daily.     prednisoLONE acetate (PRED FORTE) 1 % ophthalmic suspension Place 1 drop into the left eye in the morning and at bedtime.     sAXagliptin-metFORMIN (KOMBIGLYZE XR PO) Take 1,000 mg by  mouth daily.      valACYclovir (VALTREX) 500 MG tablet Take 1 tablet by mouth daily.     No current facility-administered medications for this visit.     Past Medical History:  Diagnosis Date   CVA (cerebral vascular accident) (HCC)    Diabetes mellitus without complication (HCC)    Left carpal tunnel syndrome 07/13/2017   OSA on CPAP    Reflux     ROS:   All systems reviewed and negative except as noted in the HPI.   Past Surgical History:  Procedure Laterality Date   ABDOMINAL HYSTERECTOMY     APPENDECTOMY     EP IMPLANTABLE DEVICE N/A 01/08/2016   Procedure: Loop Recorder Insertion;  Surgeon: Marinus Maw, MD;  Location: [facility];  Service: Cardiovascular;  Laterality: N/A;   IR GENERIC HISTORICAL  01/02/2016   IR ANGIO VERTEBRAL SEL VERTEBRAL UNI L MOD SED 01/02/2016 Julieanne Cotton, MD [facility]   IR GENERIC HISTORICAL  01/02/2016   IR ANGIO INTRA EXTRACRAN SEL COM CAROTID INNOMINATE BILAT MOD SED 01/02/2016 Julieanne Cotton, MD [facility]   IR GENERIC HISTORICAL  01/02/2016   IR ANGIO VERTEBRAL SEL SUBCLAVIAN INNOMINATE UNI R MOD SED 01/02/2016 Julieanne Cotton, MD [facility]  left cyst from breast remove     RADIOLOGY WITH ANESTHESIA N/A 01/02/2016   Procedure: RADIOLOGY WITH ANESTHESIA;  Surgeon: Medication Radiologist, MD;  Location: [facility] OR;  Service: Radiology;  Laterality: N/A;   TEE WITHOUT CARDIOVERSION N/A 01/08/2016   Procedure: TRANSESOPHAGEAL ECHOCARDIOGRAM (TEE);  Surgeon: Laurey Morale, MD;  Location: [facility] ENDOSCOPY;  Service: Cardiovascular;  Laterality: N/A;     Family History  Problem Relation Age of Onset   Atrial fibrillation Mother    Suicidality Father        20   Stroke Maternal Grandfather      Social History   Socioeconomic History   Marital status: Married    Spouse name: Not on file   Number of children: 2   Years of education: HS   Highest education level: Not on file  Occupational  History   Occupation: [employer]  Tobacco Use   Smoking status: Never Smoker   Smokeless tobacco: Never Used  Substance and Sexual Activity   Alcohol use: No   Drug use: No   Sexual activity: Not on file  Other Topics Concern   Not on file  Social History Narrative   Lives at home with her husband.   Left-handed   Occasional caffeine use.   Social Determinants of Health   Financial Resource Strain:    Difficulty of Paying Living Expenses: Not on file  Food Insecurity:    Worried About Programme researcher, broadcasting/film/video in the Last Year: Not on file   [employer] of Food in the Last Year: Not on file  Transportation Needs:    Lack of Transportation (Medical): Not on file   Lack of Transportation (Non-Medical): Not on file  Physical Activity:    Days of Exercise per Week: Not on file   Minutes of Exercise per Session: Not on file  Stress:    Feeling of Stress : Not on file  Social Connections:    Frequency of Communication with Friends and Family: Not on file   Frequency of Social Gatherings with Friends and Family: Not on file   Attends Religious Services: Not on file   Active Member of Clubs or Organizations: Not on file   Attends Banker Meetings: Not on file   Marital Status: Not on file  Intimate Partner Violence:    Fear of Current or Ex-Partner: Not on file   Emotionally Abused: Not on file   Physically Abused: Not on file   Sexually Abused: Not on file     BP (!) 144/82    Pulse 76    Ht 5\' 3"  (1.6 m)    Wt 194 lb (88 kg)    SpO2 96%    BMI 34.37 kg/m   Physical Exam:  Well appearing NAD HEENT: Unremarkable Neck:  No JVD, no thyromegally Back:  No CVA tenderness Lungs:  Clear with no wheezes HEART:  Regular rate rhythm, no murmurs, no rubs, no clicks Abd:  soft, positive bowel sounds, no organomegally, no rebound, no guarding Ext:  2 plus pulses, no edema, no cyanosis, no clubbing Skin:  No rashes no nodules Neuro:  CN II through  XII intact, motor grossly intact  DEVICE  Normal device function.  See PaceArt for details.   Assess/Plan: 1. Cryptogenic stroke - she is s/p ILR and her device is now at RRT. I have discussed the indications/risks/benefits of ILR removal and she wishes to proceed. 2. HTN -her bp is up a bit but she  is a little anxious about her ILR removal.  EP Procedure Note  Procedure Performed: ILR removal  Indication: s/p ILR insertion with the device at RRT.  Preoperative diagnosis: cryptogenic stroke  Description of the procedure: after informed consent was obtained, the patient was prepped and draped in a sterile fashion. 10 cc of lidocaine was infiltrated into the pectoral region. A one cm stab incision was carried out. A combination of blunt and sharp dissection was utilized and the ILR was grasped. The fibrous scar tissure was removed. The ILR was removed with gentle traction. Benzoin and steristrips were painted on the skin. A bandage was applied and the patient recovered in the usual manner.  Sharlot Gowda Tyan Lasure,MD

## 2023-04-15 ENCOUNTER — Other Ambulatory Visit: Payer: Self-pay

## 2023-04-15 ENCOUNTER — Emergency Department (HOSPITAL_BASED_OUTPATIENT_CLINIC_OR_DEPARTMENT_OTHER)
Admission: EM | Admit: 2023-04-15 | Discharge: 2023-04-15 | Disposition: A | Discharge status: Home / Self Care | Payer: PPO | Attending: Emergency Medicine | Admitting: Emergency Medicine

## 2023-04-15 ENCOUNTER — Emergency Department (HOSPITAL_BASED_OUTPATIENT_CLINIC_OR_DEPARTMENT_OTHER): Payer: PPO

## 2023-04-15 ENCOUNTER — Encounter (HOSPITAL_BASED_OUTPATIENT_CLINIC_OR_DEPARTMENT_OTHER): Payer: Self-pay

## 2023-04-15 DIAGNOSIS — E119 Type 2 diabetes mellitus without complications: Secondary | ICD-10-CM | POA: Diagnosis not present

## 2023-04-15 DIAGNOSIS — Z7901 Long term (current) use of anticoagulants: Secondary | ICD-10-CM | POA: Insufficient documentation

## 2023-04-15 DIAGNOSIS — U071 COVID-19: Secondary | ICD-10-CM | POA: Diagnosis not present

## 2023-04-15 DIAGNOSIS — R059 Cough, unspecified: Secondary | ICD-10-CM | POA: Diagnosis present

## 2023-04-15 DIAGNOSIS — Z7984 Long term (current) use of oral hypoglycemic drugs: Secondary | ICD-10-CM | POA: Insufficient documentation

## 2023-04-15 LAB — RESP PANEL BY RT-PCR (RSV, FLU A&B, COVID)  RVPGX2
Influenza A by PCR: NEGATIVE
Influenza B by PCR: NEGATIVE
Resp Syncytial Virus by PCR: NEGATIVE
SARS Coronavirus 2 by RT PCR: POSITIVE — AB

## 2023-04-15 MED ORDER — ONDANSETRON 4 MG PO TBDP: 4.0000 mg | ORAL_TABLET | Freq: Three times a day (TID) | ORAL | 0 refills | Status: DC | PRN

## 2023-04-15 MED ORDER — BENZONATATE 100 MG PO CAPS: 100.0000 mg | ORAL_CAPSULE | Freq: Three times a day (TID) | ORAL | 0 refills | Status: DC

## 2023-04-15 NOTE — ED Provider Notes (Signed)
[facility] Provider Note   CSN: [id number] Arrival date & time: 04/15/23  1331     History  Chief Complaint  Patient presents with   Cough   Generalized Body Aches    Caroline [name] is a 67 y.o. female.  Patient with history of stroke, diabetes, hyperlipidemia, obesity presents today with complaints of cough and bodyaches.  She states that same has been ongoing for the last 3 days.  She denies any known sick contacts.  Is taking Tylenol with some improvement.  Denies fevers or chills.  No chest pain or shortness of breath. Also endorses diminished appetite and intermittent nausea without vomiting or diarrhea. Does note she has received 2 COVID vaccines.   The history is provided by the patient. No language interpreter was used.  Cough      Home Medications Prior to Admission medications   Medication Sig Start Date End Date Taking? Authorizing Provider  apixaban (ELIQUIS) 2.5 MG TABS tablet Take 2.5 mg by mouth 2 (two) times daily. 01/14/18   [provider]  Ascorbic Acid (VITAMIN C GUMMIES PO) Take 1,000 mg by mouth daily.     [provider]  atorvastatin (LIPITOR) 10 MG tablet Take 1 tablet (10 mg total) by mouth daily at 6 PM. 01/08/16   Rinehuls, Kinnie Scales, PA-C  clotrimazole-betamethasone (LOTRISONE) cream Apply 1 application topically as needed (itching).  06/28/17   [provider]  DOCUSATE SODIUM PO Take 100 mg by mouth daily.     [provider]  famotidine (PEPCID) 20 MG tablet Take 1 tablet by mouth daily. 12/19/19   [provider]  fluticasone Aleda Grana) 50 MCG/ACT nasal spray Use 2 sprays in each nostril once daily 09/29/16   [provider]  glipiZIDE (GLUCOTROL) 10 MG tablet Take 1 tablet by mouth in the morning and at bedtime. 09/02/19   [provider]  losartan (COZAAR) 25 MG tablet Take 25 mg by mouth daily. as directed 03/25/16   [provider]   Melatonin 10 MG TABS Take 10 mg by mouth as needed (sleep).     [provider]  montelukast (SINGULAIR) 10 MG tablet Take 10 mg by mouth at bedtime.  04/05/17   [provider]  Multiple Vitamin (MULTIVITAMIN WITH MINERALS) TABS tablet Take 1 tablet by mouth daily.    [provider]  Multiple Vitamins-Minerals (MULTIVITAMIN ADULTS PO) Take 1 tablet by mouth daily.     [provider]  nystatin (MYCOSTATIN/NYSTOP) powder Apply to affected area twice daily.prn 06/14/17   [provider]  pantoprazole (PROTONIX) 40 MG tablet Take 40 mg by mouth daily.    [provider]  prednisoLONE acetate (PRED FORTE) 1 % ophthalmic suspension Place 1 drop into the left eye in the morning and at bedtime. 08/30/19   [provider]  sAXagliptin-metFORMIN (KOMBIGLYZE XR PO) Take 1,000 mg by mouth daily.     [provider]  valACYclovir (VALTREX) 500 MG tablet Take 1 tablet by mouth daily. 09/21/19   [provider]      Allergies    Amoxicillin, Azithromycin, Homatropine, Hydrocodone bit-homatrop mbr, Penicillin g, and Tape    Review of Systems   Review of Systems  HENT:  Positive for congestion.   Respiratory:  Positive for cough.   All other systems reviewed and are negative.   Physical Exam Updated Vital Signs BP 128/81   Pulse (!) 112   Temp 99.4 F (37.4  C) (Oral)   Resp 18   Ht 5\' 3"  (1.6 m)   Wt 88 kg   SpO2 95%   BMI 34.37 kg/m  Physical Exam Vitals and nursing note reviewed.  Constitutional:      General: She is not in acute distress.    Appearance: Normal appearance. She is normal weight. She is not ill-appearing, toxic-appearing or diaphoretic.  HENT:     Head: Normocephalic and atraumatic.  Neck:     Comments: No meningismus Cardiovascular:     Rate and Rhythm: Normal rate and regular rhythm.     Heart sounds: Normal heart sounds.  Pulmonary:     Effort: Pulmonary effort is normal. No respiratory  distress.     Breath sounds: Normal breath sounds.  Abdominal:     General: Abdomen is flat.     Palpations: Abdomen is soft.     Tenderness: There is no abdominal tenderness.  Musculoskeletal:        General: Normal range of motion.     Cervical back: Normal range of motion and neck supple.  Skin:    General: Skin is warm and dry.  Neurological:     General: No focal deficit present.     Mental Status: She is alert.  Psychiatric:        Mood and Affect: Mood normal.        Behavior: Behavior normal.     ED Results / Procedures / Treatments   Labs (all labs ordered are listed, but only abnormal results are displayed) Labs Reviewed  RESP PANEL BY RT-PCR (RSV, FLU A&B, COVID)  RVPGX2 - Abnormal; Notable for the following components:      Result Value   SARS Coronavirus 2 by RT PCR POSITIVE (*)    All other components within normal limits    EKG None  Radiology DG Chest 2 View Result Date: 04/15/2023 CLINICAL DATA:  Cough.  Chills and headache. EXAM: CHEST - 2 VIEW COMPARISON:  Chest radiograph dated 08/06/2022. FINDINGS: Minimal left lung base atelectasis. No focal consolidation, pleural effusion, or pneumothorax. The cardiac silhouette is within normal limits. No acute osseous pathology. IMPRESSION: No active cardiopulmonary disease. Electronically Signed   By: Elgie Collard M.D.   On: 04/15/2023 15:19    Procedures Procedures    Medications Ordered in ED Medications - No data to display  ED Course/ Medical Decision Making/ A&P                                 Medical Decision Making Amount and/or Complexity of Data Reviewed Radiology: ordered.   Patient presents today with complaints of cough and congestion x 3 days. She is afebrile, non-toxic appearing, and in no acute distress with reassuring vital signs. Physical exam reveals lung sounds clear to auscultation in all fields. Patient speaking in complete sentences in no acute distress. Patient positive for  COVID, symptoms consistent with same. Chest x-ray shows NAD.  I have personally reviewed and interpreted this imaging and agree with radiology interpretation. Given patients age and comorbid risk factors, offered paxlovid which patient declined. Will give zofran for nausea and tessalon for cough. OTC recommendations provided and discussed as well. Emphasized importance of maintaining adequate oral hydration. Evaluation and diagnostic testing in the emergency department does not suggest an emergent condition requiring admission or immediate intervention beyond what has been performed at this time.  Plan for discharge with close PCP follow-up.  Patient is understanding and amenable with plan, educated on red flag symptoms that would prompt immediate return.  Patient discharged in stable condition.  Final Clinical Impression(s) / ED Diagnoses Final diagnoses:  COVID    Rx / DC Orders ED Discharge Orders          Ordered    ondansetron (ZOFRAN-ODT) 4 MG disintegrating tablet  Every 8 hours PRN        04/15/23 1835    benzonatate (TESSALON) 100 MG capsule  Every 8 hours        04/15/23 1835          An After Visit Summary was printed and given to the patient.     Vear Clock 04/15/23 1837    Rondel Baton, MD 04/16/23 1224

## 2023-04-15 NOTE — ED Triage Notes (Signed)
The patient having cough, chills, body aches for three days.

## 2023-04-15 NOTE — Discharge Instructions (Addendum)
As we discussed, you tested positive for COVID today which is likely the cause of your symptoms.  As this is a virus, no antibiotics are indicated.  I did offer you the COVID antiviral which you did not want.  I recommend that you get plenty of rest and focus on symptomatic relief which includes Cepacol throat lozenges for sore throat, Mucinex for congestion, and tylenol/ibuprofen as needed for fevers and bodyaches. I have also given you a prescription for Tessalon and Zofran which is a cough suppressant and a nausea medication for you to take as prescribed as needed for management of your symptoms. I also recommend:  Increased fluid intake. Sports drinks offer valuable electrolytes, sugars, and fluids.  Breathing heated mist or steam (vaporizer or shower).  Eating chicken soup or other clear broths, and maintaining good nutrition.   Increasing usage of your inhaler if you have asthma.  Return to work when your temperature has returned to normal.  Gargle warm salt water and spit it out for sore throat. Take benadryl or Zyrtec to decrease sinus secretions.  Follow Up: Follow up with your primary care doctor in 5-7 days for recheck of ongoing symptoms.  Return to emergency department for emergent changing or worsening of symptoms.

## 2023-07-21 ENCOUNTER — Encounter: Payer: Self-pay | Admitting: Neurology

## 2023-07-21 ENCOUNTER — Ambulatory Visit: Payer: PPO | Admitting: Neurology

## 2023-07-21 VITALS — BP 115/72 | HR 72 | Ht 63.0 in | Wt 173.0 lb

## 2023-07-21 DIAGNOSIS — R4702 Dysphasia: Secondary | ICD-10-CM | POA: Diagnosis not present

## 2023-07-21 DIAGNOSIS — G4733 Obstructive sleep apnea (adult) (pediatric): Secondary | ICD-10-CM

## 2023-07-21 DIAGNOSIS — Z8673 Personal history of transient ischemic attack (TIA), and cerebral infarction without residual deficits: Secondary | ICD-10-CM

## 2023-07-21 DIAGNOSIS — R404 Transient alteration of awareness: Secondary | ICD-10-CM | POA: Diagnosis not present

## 2023-07-21 DIAGNOSIS — G3184 Mild cognitive impairment, so stated: Secondary | ICD-10-CM

## 2023-07-21 NOTE — Patient Instructions (Addendum)
 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  I recommend further evaluation with checking EEG memory panel labs and MR angiogram of the brain and neck.  We also discussed results of MRI scan of the brain she had in December 2024 which suggested interval new strokes which may perhaps be contributing to her mild cognitive impairment and speech difficulties.  Continue Eliquis for stroke prevention given her history of hypercoagulability and maintain aggressive risk factor modification with strict control of hypertension with blood pressure goal below 130/90, lipids with LDL cholesterol goal below 70 mg percent and diabetes with hemoglobin A1c goal below 6.5%.  We also discussed memory compensation strategies and I encouraged her to increase participation in cognitively challenging activities like solving crossword puzzles, playing bridge and sudoku.  She was also counseled to be compliant with using her CPAP every night and recommend follow-up with sleep clinic with Dr. Frances Furbish management of her sleep apnea.  Return for follow-up to see me me in 6 months or call earlier if necessary.  Memory Compensation Strategies  Use "WARM" strategy.  W= write it down  A= associate it  R= repeat it  M= make a mental note  2.   You can keep a Glass blower/designer.  Use a 3-ring notebook with sections for the following: calendar, important names and phone numbers,  medications, doctors' names/phone numbers, lists/reminders, and a section to journal what you did  each day.   3.    Use a calendar to write appointments down.  4.    Write yourself a schedule for the day.  This can be placed on the calendar or in a separate section of the Memory Notebook.  Keeping a  regular schedule can help memory.  5.    Use  medication organizer with sections for each day or morning/evening pills.  You may need help loading it  6.    Keep a basket, or pegboard by the door.  Place items that you need to take out with you in the basket or on the pegboard.  You may also want to  include a message board for reminders.  7.    Use sticky notes.  Place sticky notes with reminders in a place where the task is performed.  For example: " turn off the  stove" placed by the stove, "lock the door" placed on the door at eye level, " take your medications" on  the bathroom mirror or by the place where you normally take your medications.  8.    Use alarms/timers.  Use while cooking to remind yourself to check on food or as a reminder to take your medicine, or as a  reminder to make a call, or as a reminder to perform another task, etc.
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
## 2023-07-22 ENCOUNTER — Telehealth: Payer: Self-pay | Admitting: Neurology

## 2023-07-22 NOTE — Telephone Encounter (Signed)
 no auth required sent to [facility] [phone]

## 2023-07-23 LAB — DEMENTIA PANEL
Homocysteine: 10.8 umol/L (ref 0.0–17.2)
RPR Ser Ql: NONREACTIVE
TSH: 2.01 u[IU]/mL (ref 0.450–4.500)
Vitamin B-12: >2000 pg/mL — ABNORMAL HIGH (ref 232–1245)

## 2023-07-23 NOTE — Progress Notes (Signed)
 Kindly inform the patient that lab work for reversible causes of memory loss was all satisfactory

## 2023-07-26 ENCOUNTER — Ambulatory Visit: Admitting: Neurology

## 2023-07-26 DIAGNOSIS — R4182 Altered mental status, unspecified: Secondary | ICD-10-CM

## 2023-07-26 DIAGNOSIS — R4702 Dysphasia: Secondary | ICD-10-CM

## 2023-07-26 DIAGNOSIS — G3184 Mild cognitive impairment, so stated: Secondary | ICD-10-CM

## 2023-08-03 ENCOUNTER — Telehealth: Payer: Self-pay

## 2023-08-03 NOTE — Telephone Encounter (Signed)
 Called pt and asked her to bring her CPAP Machine with her to her appointment tomorrow 08/04/2023

## 2023-08-04 ENCOUNTER — Ambulatory Visit (INDEPENDENT_AMBULATORY_CARE_PROVIDER_SITE_OTHER): Admitting: Neurology

## 2023-08-04 ENCOUNTER — Encounter: Payer: Self-pay | Admitting: Neurology

## 2023-08-04 VITALS — BP 128/72 | HR 61 | Ht 63.0 in | Wt 175.0 lb

## 2023-08-04 DIAGNOSIS — G4733 Obstructive sleep apnea (adult) (pediatric): Secondary | ICD-10-CM | POA: Diagnosis not present

## 2023-08-04 DIAGNOSIS — R351 Nocturia: Secondary | ICD-10-CM

## 2023-08-04 DIAGNOSIS — Z8673 Personal history of transient ischemic attack (TIA), and cerebral infarction without residual deficits: Secondary | ICD-10-CM | POA: Diagnosis not present

## 2023-08-04 DIAGNOSIS — E66811 Obesity, class 1: Secondary | ICD-10-CM | POA: Diagnosis not present

## 2023-08-04 NOTE — Patient Instructions (Addendum)
 It was nice to see you today.  As discussed, I will order a home sleep test for re-evaluation of your sleep apnea, as testing was over 5 years ago. The home sleep test is done (HST) to re-establish/confirm the sleep apnea diagnosis and to get your a new machine through the insurance. Our sleep lab staff will reach out to you to arrange for pickup and for tutorial of the test equipment. I will write for a new machine after the HST confirms the OSA (obstructive sleep apnea) diagnosis. Please remember, you will not use your CPAP machine during the night of testing so we get diagnostic data, not treatment data. We will schedule a follow-up appointment after the new machine set-up date, typically within 31 to 89 days post treatment start.  Per insurance requirement, you will need to show compliance with usage and fulfill a minimum usage percentage.  You can hopefully use the ResMed Airtouch F20 full face sample mask in the meantime.

## 2023-08-04 NOTE — Progress Notes (Signed)
 Subjective:    Patient ID: Caroline [name] is a 68 y.o. female.  HPI    Huston Foley, MD, PhD [facility] [address], [address] [address] [place], [place] [address]  Dear Janalyn Shy,   I saw your patient, Makaylee Spielberg, upon your kind request in my sleep clinic today for initial consultation of her sleep disorder, in particular, evaluation of her prior diagnosis of obstructive sleep apnea.  The patient is unaccompanied today. As you know, Ms. Nunnelley is a 68 year old female with an underlying medical history of hypercoagulable state, DVT (on Eliquis), stroke in 2017, diabetes, carpal tunnel syndrome, reflux disease, and borderline obesity, who was previously diagnosed with obstructive sleep apnea and placed on PAP therapy.  She reports full compliance with her CPAP and ongoing good results but has not had recent supplies.  She uses a small ResMed AirTouch fullface mask.  She is a mouth breather.  She would like to get a new machine.  Bedtime is generally between 9 and 9:30 PM and rise time around 6 AM.  She works in Audiological scientist at [employer].  She has nocturia about twice per average night.  She drinks caffeine in the form of diet soda or tea, about 3 servings per day on average.  She denies recurrent morning headaches.  She is a non-smoker and does not drink alcohol.  She lives with her husband.  Prior sleep study results were reviewed today, testing was in 2018.  Her Epworth sleepiness score is 7 out of 24, fatigue severity score is 16 out of 63.  I reviewed your office note from 07/21/2023.  She is scheduled for a head and neck MR angiogram next week. I had evaluated her in 2018.  She has not been seen in my sleep clinic since 2018.  Her baseline sleep study from 05/03/2016 showed a total AHI of 8.5/h, REM AHI of 23.9/h, supine AHI of 20/h, O2 nadir 79%.  She had a subsequent CPAP titration study and did quite well on CPAP of 7 cm. I reviewed her CPAP compliance  data from the past 30 days, she used her machine every night with an average usage of 8 hours and 5 minutes, residual AHI at goal at 0.5/h, pressure of 7 cm with EPR of 2, leak on the higher side fairly consistently with the 95th percentile at 52.5 L/min.   Previously (copied from previous notes for reference):   07/29/2016: 68 year old left-handed woman with an underlying medical history of type 2 diabetes, recent stroke in September 2017 of the left MCA, hypertension, hyperlipidemia and overweight state, who presents for follow-up consultation of her obstructive sleep apnea, after recent sleep study testing. The patient is unaccompanied today. I first met her on 04/22/2016 at the request of Dr. Lucia Gaskins, at which time she reported snoring and excessive daytime somnolence. She was invited for a sleep study. She had a baseline sleep study, followed by a CPAP titration study. I went over her test results in detail with her today. Her baseline sleep study from 05/03/2016 showed a sleep efficiency of 83.1%, sleep latency was prolonged at 52 minutes, REM latency was 60 minutes. She had an increased percentage of stage II sleep, a very small amount of slow-wave sleep and REM sleep was 21.4%. She had a total AHI of 8.5 per hour, REM AHI was 23.9 per hour, supine AHI was 20 per hour. Average oxygen saturation was 84%, nadir was 79%. Time below 89% saturation was 26 minutes.  She had no significant PLMS. Based on her sleep-related complaints, her medical history including stroke, and her sleep study results I suggested she return for a full night CPAP titration study. She had this on 05/24/2016. Sleep efficiency was 95.7%, sleep latency was 24 minutes, REM latency was 67 minutes. She had an increased percentage of light stage sleep, slow-wave sleep was absent, REM sleep was 18.6%. She had no significant PLMS, average oxygen saturation was 96%, nadir was 90%. She was fitted with a small nasal mask. CPAP was titrated from 5 cm  to 7 cm. On the final pressure she had an AHI of 0 per hour with supine REM sleep achieved an O2 nadir of 90%.    I reviewed her CPAP compliance data from 06/28/2016 through 07/27/2016, which is a total of 30 days, during which time she used her CPAP every night with percent used days greater than 4 hours at 100%, indicating superb compliance with an average usage of 7 hours and 41 minutes, residual AHI low at 0.6 per hour, leak acceptable with the 95th percentile at 17.9 L/m on a pressure of 7 cm with EPR of 2. She reports feeling better rested, better quality, no longer any nocturia. She recently saw Dr. Janett Medin on 07/02/2016 for stroke follow-up. I reviewed the office note.   She reports other than some marks on her face from the nasal mask and mouth dryness she is tolerating the treatment and feels improved. She often looks forward to sleeping because she feels well rested when she wakes up in the morning.    04/22/2016: She reports snoring and excessive daytime somnolence. I reviewed your office note from 03/10/2016.  She is on metformin for her DM, she has been on Plavix and lipitor since the stroke. She sees Dr. Milly Almas at [facility] for her PCP.  She has occasional morning headaches, nocturia 1-2 times per night.  She has a implantable loop recorder. Snoring can be very loud, per husband's feedback to her.  She is back working FT, no residual Sx. Had slurring of speech. She is a nonsmoker, she does drink alcohol. Her MGF had a stroke. She had ST, no need for PT or OT.  She has multiple questions about her stroke and further management.  She snores, worse on her back. BT is 9:30 to 10 PM, WT at 6 AM. ESS is 13/24, FSS is 29/63. Some aching in legs at night. Her husband has obstructive sleep apnea and uses a CPAP. She has recently resumed driving. She gets fatigued easily but otherwise has no residual symptoms.     Her Past Medical History Is Significant For: Past Medical History:  Diagnosis Date    CVA (cerebral vascular accident) (HCC)    Diabetes mellitus without complication (HCC)    Left carpal tunnel syndrome 07/13/2017   OSA on CPAP    Reflux     Her Past Surgical History Is Significant For: Past Surgical History:  Procedure Laterality Date   ABDOMINAL HYSTERECTOMY     APPENDECTOMY     EP IMPLANTABLE DEVICE N/A 01/08/2016   Procedure: Loop Recorder Insertion;  Surgeon: Tammie Fall, MD;  Location: [facility] INVASIVE CV LAB;  Service: Cardiovascular;  Laterality: N/A;   IR GENERIC HISTORICAL  01/02/2016   IR ANGIO VERTEBRAL SEL VERTEBRAL UNI L MOD SED 01/02/2016 Luellen Sages, MD [facility]INTER[facility] RAD   IR GENERIC HISTORICAL  01/02/2016   IR ANGIO INTRA EXTRACRAN SEL COM CAROTID INNOMINATE BILAT MOD SED 01/02/2016 Luellen Sages, MD [facility]   IR GENERIC HISTORICAL  01/02/2016   IR ANGIO VERTEBRAL SEL SUBCLAVIAN INNOMINATE UNI R MOD SED 01/02/2016 Luellen Sages, MD [facility]   left cyst from breast remove     RADIOLOGY WITH ANESTHESIA N/A 01/02/2016   Procedure: RADIOLOGY WITH ANESTHESIA;  Surgeon: Medication Radiologist, MD;  Location: [facility] OR;  Service: Radiology;  Laterality: N/A;   TEE WITHOUT CARDIOVERSION N/A 01/08/2016   Procedure: TRANSESOPHAGEAL ECHOCARDIOGRAM (TEE);  Surgeon: Darlis Eisenmenger, MD;  Location: [facility] ENDOSCOPY;  Service: Cardiovascular;  Laterality: N/A;    Her Family History Is Significant For: Family History  Problem Relation Age of Onset   Atrial fibrillation Mother    Suicidality Father        64   Stroke Maternal Grandfather     Her Social History Is Significant For: Social History   Socioeconomic History   Marital status: Married    Spouse name: Not on file   Number of children: 2   Years of education: HS   Highest education level: Not on file  Occupational History   Occupation: [employer]  Tobacco Use   Smoking status: Never   Smokeless tobacco: Never  Substance and Sexual Activity   Alcohol use: No   Drug use: No   Sexual  activity: Not on file  Other Topics Concern   Not on file  Social History Narrative   Lives at home with her husband.   Left-handed   Occasional caffeine use.   Social Drivers of Corporate investment banker Strain: Not on file  Food Insecurity: Low Risk  (08/06/2022)   Received from [facility]   Hunger Vital Sign    Worried About Running Out of Food in the Last Year: Never true    Ran Out of Food in the Last Year: Never true  Transportation Needs: No Transportation Needs (08/06/2022)   Received from [facility]    In the past 12 months, has lack of reliable transportation kept you from medical appointments, meetings, work or from getting things needed for daily living? : No  Physical Activity: Not on file  Stress: Not on file  Social Connections: Not on file    Her Allergies Are:  Allergies  Allergen Reactions   Amoxicillin Rash   Azithromycin Rash   Homatropine Rash   Hydrocodone Bit-Homatrop Mbr Rash   Penicillin G Rash    Dyspepsia, ingestion   Tape Rash  :   Her Current Medications Are:  Outpatient Encounter Medications as of 08/04/2023  Medication Sig   apixaban (ELIQUIS) 2.5 MG TABS tablet Take 2.5 mg by mouth 2 (two) times daily.   Ascorbic Acid (VITAMIN C GUMMIES PO) Take 1,000 mg by mouth daily.    atorvastatin (LIPITOR) 10 MG tablet Take 1 tablet (10 mg total) by mouth daily at 6 PM.   benzonatate (TESSALON) 100 MG capsule Take 1 capsule (100 mg total) by mouth every 8 (eight) hours.   Blood Glucose Monitoring Suppl (ONETOUCH VERIO FLEX SYSTEM) w/Device KIT daily.   clotrimazole-betamethasone (LOTRISONE) cream Apply 1 application topically as needed (itching).    DOCUSATE SODIUM PO Take 100 mg by mouth daily.    famotidine (PEPCID) 20 MG tablet Take 1 tablet by mouth daily.   fluticasone (FLONASE) 50 MCG/ACT nasal spray Use 2 sprays in each nostril once daily   losartan (COZAAR) 25 MG tablet Take 25 mg by mouth daily. as directed    Melatonin 10 MG TABS Take 10 mg  by mouth as needed (sleep).    MOUNJARO 12.5 MG/0.5ML Pen Inject 12.5 mg into the skin once a week.   Multiple Vitamin (MULTIVITAMIN WITH MINERALS) TABS tablet Take 1 tablet by mouth daily.   Multiple Vitamins-Minerals (MULTIVITAMIN ADULTS PO) Take 1 tablet by mouth daily.    nystatin (MYCOSTATIN/NYSTOP) powder    omeprazole (PRILOSEC) 40 MG capsule Take 40 mg by mouth 2 (two) times daily.   ondansetron (ZOFRAN-ODT) 4 MG disintegrating tablet Take 1 tablet (4 mg total) by mouth every 8 (eight) hours as needed for nausea or vomiting.   valACYclovir (VALTREX) 500 MG tablet Take 1 tablet by mouth daily.   glipiZIDE (GLUCOTROL) 10 MG tablet Take 1 tablet by mouth in the morning and at bedtime. (Patient not taking: Reported on 08/04/2023)   montelukast (SINGULAIR) 10 MG tablet Take 10 mg by mouth at bedtime.  (Patient not taking: Reported on 08/04/2023)   pantoprazole (PROTONIX) 40 MG tablet Take 40 mg by mouth daily. (Patient not taking: Reported on 07/21/2023)   prednisoLONE acetate (PRED FORTE) 1 % ophthalmic suspension Place 1 drop into the left eye in the morning and at bedtime. (Patient not taking: Reported on 08/04/2023)   sAXagliptin-metFORMIN (KOMBIGLYZE XR PO) Take 1,000 mg by mouth daily.  (Patient not taking: Reported on 08/04/2023)   No facility-administered encounter medications on file as of 08/04/2023.  :   Review of Systems:  Out of a complete 14 point review of systems, all are reviewed and negative with the exception of these symptoms as listed below:  Review of Systems  Neurological:        Room 5 Pt is here Alone. Pt has had her machine for 8 years and is needing new supplies and possible a new machine. ESS 7  FSS 15     Objective:  Neurological Exam  Physical Exam Physical Examination:   Vitals:   08/04/23 1243  BP: 128/72  Pulse: 61    General Examination: The patient is a very pleasant 68 y.o. female in no acute distress. She appears  well-developed and well-nourished and well groomed.   HEENT: Normocephalic, atraumatic, pupils are equal, round and reactive to light and accommodation. Extraocular tracking is good without limitation to gaze excursion or nystagmus noted. Normal smooth pursuit is noted. Hearing is grossly intact. Face is symmetric with normal facial animation and normal facial sensation. Speech is clear with no dysarthria noted. There is no hypophonia. There is no lip, neck/head, jaw or voice tremor. Neck is supple with full range of passive and active motion. There are no carotid bruits on auscultation. Oropharynx exam reveals: mild mouth dryness, good dental hygiene and mild airway crowding, due to smaller airway and redundant soft palate. Mallampati is class II. Tongue protrudes centrally and palate elevates symmetrically. Tonsils are absent. Neck size is 15.25 inches. She has a Mild overbite.    Chest: Clear to auscultation without wheezing, rhonchi or crackles noted.  Heart: S1+S2+0, regular and normal without murmurs, rubs or gallops noted.   Abdomen: Soft, non-tender and non-distended.  Extremities: There is nonpitting puffiness both lower extremities, varicose veins, more prominent left lower extremity compared to right, spider veins around the ankles as well.    Skin: Warm and dry without trophic changes noted.   Musculoskeletal: exam reveals no obvious joint deformities.   Neurologically:  Mental status: The patient is awake, alert and oriented in all 4 spheres. Her immediate and remote memory, attention, language skills and fund of knowledge are appropriate. There  is no evidence of aphasia, agnosia, apraxia or anomia. Speech is clear with normal prosody and enunciation. Thought process is linear. Mood is normal and affect is normal.  Cranial nerves II - XII are as described above under HEENT exam.  Motor exam: Normal bulk, strength and tone is noted. There is no obvious action or resting tremor.  Fine  motor skills and coordination: grossly intact.  Cerebellar testing: No dysmetria or intention tremor. There is no truncal or gait ataxia.  Sensory exam: intact to light touch in the upper and lower extremities.  Gait, station and balance: She stands easily. No veering to one side is noted. No leaning to one side is noted. Posture is age-appropriate and stance is narrow based. Gait shows normal stride length and normal pace. No problems turning are noted.   Assessment and Plan:   In summary, HENLEY BLYTH is a very pleasant 68 y.o.-year old female with an underlying medical history of hypercoagulable state, DVT (on Eliquis), stroke in 2017, diabetes, carpal tunnel syndrome, reflux disease, and borderline obesity, who presents for evaluation of her obstructive sleep apnea.  She has been on CPAP therapy for the past several years, was diagnosed with overall mild sleep apnea in 2018.  She has benefited from treatment and has been compliant with it, tolerating treatment at the current settings via fullface mask.  I provided her with a sample fullface mask from [employer] today.  We discussed the diagnosis of obstructive sleep apnea and treatment options including surgical options versus ongoing treatment with PAP therapy.  She is agreeable to maintaining treatment with CPAP or AutoPap therapy.  She should be eligible for new machine.  I will order a home sleep test, she is reminded to skip treatment at night the night of testing at home and then resume treatment on an ongoing basis.  Once she has her new machine, we will arrange for a follow-up visit in our sleep clinic with the nurse practitioner or myself.  She is commended for her treatment adherence.  I answered all her questions today and she was in agreement with our plan.   Thank you very much for allowing me to participate in the care of this nice patient. If I can be of any further assistance to you please do not hesitate to talk to  me.  Sincerely,   Debbra Fairy, MD, PhD

## 2023-08-08 NOTE — Progress Notes (Signed)
 Kindly inform the patient that EEG of brainwave study was normal.  No seizure activity noted.

## 2023-08-09 ENCOUNTER — Telehealth: Payer: Self-pay | Admitting: Neurology

## 2023-08-09 NOTE — Telephone Encounter (Signed)
 HTA HST pending

## 2023-08-11 ENCOUNTER — Ambulatory Visit
Admission: RE | Admit: 2023-08-11 | Discharge: 2023-08-11 | Disposition: A | Discharge status: Home / Self Care | Source: Ambulatory Visit | Attending: Neurology | Admitting: Neurology

## 2023-08-11 MED ORDER — GADOPICLENOL 0.5 MMOL/ML IV SOLN
8.0000 mL | Freq: Once | INTRAVENOUS | Status: AC | PRN
  Administered 2023-08-11: 8 mL via INTRAVENOUS

## 2023-08-12 DIAGNOSIS — Z8673 Personal history of transient ischemic attack (TIA), and cerebral infarction without residual deficits: Secondary | ICD-10-CM | POA: Diagnosis not present

## 2023-08-17 NOTE — Progress Notes (Signed)
 Kindly inform the patient that MR angiogram study of the blood vessels of the neck shows no major blockages to worry about. MR angiogram study of the brain blood vessels shows mild narrowing of the blood vessels on the top on the left side in the deeper portion of the brain.  Nothing to worry about.  Continue present management

## 2023-08-23 NOTE — Telephone Encounter (Signed)
 HST HTA Siegfried Dress: [id number] (exp. 08/09/23 to 11/07/23)

## 2023-08-27 ENCOUNTER — Ambulatory Visit: Admitting: Neurology

## 2023-08-27 DIAGNOSIS — R351 Nocturia: Secondary | ICD-10-CM

## 2023-08-27 DIAGNOSIS — G4733 Obstructive sleep apnea (adult) (pediatric): Secondary | ICD-10-CM | POA: Diagnosis not present

## 2023-08-27 DIAGNOSIS — Z8673 Personal history of transient ischemic attack (TIA), and cerebral infarction without residual deficits: Secondary | ICD-10-CM

## 2023-08-27 DIAGNOSIS — R0683 Snoring: Secondary | ICD-10-CM

## 2023-08-27 DIAGNOSIS — E66811 Obesity, class 1: Secondary | ICD-10-CM

## 2023-09-03 NOTE — Progress Notes (Signed)
 See procedure note.

## 2023-09-06 ENCOUNTER — Encounter: Payer: Self-pay | Admitting: Neurology

## 2023-09-06 ENCOUNTER — Ambulatory Visit: Payer: Self-pay | Admitting: Neurology

## 2023-09-06 DIAGNOSIS — G4733 Obstructive sleep apnea (adult) (pediatric): Secondary | ICD-10-CM

## 2023-09-06 NOTE — Procedures (Signed)
 Caroline [name]  HOME SLEEP TEST (SANSA) REPORT (Mail-Out Device):   STUDY DATE: 08/30/2023  DOB: 09-10-1955  MRN: [id number]  ORDERING CLINICIAN: Debbra Fairy, MD, PhD   REFERRING CLINICIAN: Dr. Ardella Beaver  CLINICAL INFORMATION/HISTORY: 68 year old female with an underlying medical history of hypercoagulable state, DVT (on Eliquis ), stroke in 2017, diabetes, carpal tunnel syndrome, reflux disease, and borderline obesity, who was previously diagnosed with obstructive sleep apnea and placed on PAP therapy.  She has been compliant with her CPAP of 7 cm with EPR of 2 with good tolerance and good apnea control.  She should be eligible for a new machine.      PATIENT'S LAST REPORTED EPWORTH SLEEPINESS SCORE (ESS): 7/24.  BMI (at the time of sleep clinic visit and/or test date): 31 kg/m  FINDINGS:   Study Protocol:    The SANSA single-point-of-skin-contact chest-worn sensor - an FDA cleared and DOT approved type 4 home sleep test device - measures eight physiological channels,  including blood oxygen saturation (measured via PPG [photoplethysmography]), EKG-derived heart rate, respiratory effort, chest movement (measured via accelerometer), snoring, body position, and actigraphy. The device is designed to be worn for up to 10 hours per study.   Sleep Summary:   Total Recording Time (hours, min): 9 hours, 17 min  Total Effective Sleep Time (hours, min):  7 hours, 4 min  Sleep Efficiency (%):    76%   Respiratory Indices:   Calculated sAHI (per hour):  1.8/hour         Oxygen Saturation Statistics:    Oxygen Saturation (%) Mean: 91.9%   Minimum oxygen saturation (%):                 84.6%   O2 Saturation Range (%): 84.6-99.2%   Time below or at 88% saturation: 2 min   Pulse Rate Statistics:   Pulse Mean (bpm):    69/min    Pulse Range (54-120/min)   Snoring: Mild, intermittent  IMPRESSION/DIAGNOSES:   Primary snoring      RECOMMENDATIONS:   This home sleep test does not demonstrate any significant obstructive or central sleep disordered breathing with a total AHI of less than 5/hour.  Her total AHI was 1.8/h, utilizing the 4% desaturation criteria for obstructive hypopneas per Medicare guidelines.  O2 nadir was 84.6 percent with mild intermittent snoring detected.  Based on this test result, ongoing treatment with a PAP machine is no longer necessary.  I recommend to verify that she did not use her PAP machine during this test, as using PAP therapy would have resulted in an underestimation of her sleep disordered breathing. For disturbing snoring, an oral appliance through dentistry or orthodontics can be considered.  Other causes of the patient's symptoms, including circadian rhythm disturbances, an underlying mood disorder, medication effect and/or an underlying medical problem cannot be ruled out based on this test. Clinical correlation is recommended.  The patient should be cautioned not to drive, work at heights, or operate dangerous or heavy equipment when tired or sleepy. Review and reiteration of good sleep hygiene measures should be pursued with any patient. The patient will be advised to follow up with her referring provider, who will be notified of the test results.   I certify that I have reviewed the raw data recording prior to the issuance of this report in accordance with the standards of the American Academy of Sleep Medicine (AASM).    INTERPRETING PHYSICIAN:   Debbra Fairy, MD, PhD  Medical Director, [name]) Diplomat, ABPN (Neurology and Sleep)   [name] [address], [address] [place], [place] [address] [phone]

## 2023-09-08 NOTE — Telephone Encounter (Signed)
-----   Message from Debbra Fairy sent at 09/06/2023  5:48 PM EDT ----- Patient was referred by Dr. Janett Medin for reevaluation of her OSA.  She has been compliant with CPAP of 7 cm and should be eligible for new machine but her recent home sleep test from 08/30/2023 did not show any significant sleep apnea.  Based on this test results she does not need to be on treatment any longer.  Please double check that she did not use her CPAP machine at the time of testing.

## 2023-09-08 NOTE — Telephone Encounter (Signed)
 Pt returned call.  I gave her the results of the sleep study.  I read her the results. She did NOT use cpap.  She said she did not sleep well, did not feel rested.  I read to her the # hrs slept, her AHI.  She kept repeating that she did not feel rested.  She had stroke and that was reason for sleep eval.  She does snore. She does not really want to give up her pap machine.  Her machine has motor exceeded message.  I told her that insurance would most likely not pay for a new machine since the sleep study did not show any significant apnea. Would you right for a new machine?

## 2023-09-09 ENCOUNTER — Telehealth: Payer: Self-pay | Admitting: Neurology

## 2023-09-09 NOTE — Addendum Note (Signed)
 Addended by: Yutaka Holberg on: 09/09/2023 08:18 AM   Modules accepted: Orders

## 2023-09-09 NOTE — Telephone Encounter (Signed)
 NPSG HTA pending

## 2023-09-21 NOTE — Telephone Encounter (Signed)
 NPSG HTA auth: [id number] (exp.09/09/23 to 12/08/23)

## 2023-10-04 NOTE — Telephone Encounter (Signed)
 Spoke with the patient.  NPSG HTA Siegfried Dress: [id number] (exp. 09/09/23 to 12/08/23)   She is scheduled at [facility] 10/21/23 at 9 pm.  Mailed packet and sent mychart

## 2023-10-21 ENCOUNTER — Encounter

## 2023-10-29 ENCOUNTER — Ambulatory Visit (INDEPENDENT_AMBULATORY_CARE_PROVIDER_SITE_OTHER): Admitting: Neurology

## 2023-10-29 DIAGNOSIS — E66811 Obesity, class 1: Secondary | ICD-10-CM

## 2023-10-29 DIAGNOSIS — G4733 Obstructive sleep apnea (adult) (pediatric): Secondary | ICD-10-CM

## 2023-10-29 DIAGNOSIS — G4720 Circadian rhythm sleep disorder, unspecified type: Secondary | ICD-10-CM

## 2023-10-29 DIAGNOSIS — Z8673 Personal history of transient ischemic attack (TIA), and cerebral infarction without residual deficits: Secondary | ICD-10-CM

## 2023-10-29 DIAGNOSIS — R351 Nocturia: Secondary | ICD-10-CM

## 2023-11-02 NOTE — Procedures (Signed)
 Physician Interpretation:     [facility] POLYSOMNOGRAPHY  INTERPRETATION REPORT   STUDY DATE:  10/29/2023     PATIENT NAME:  Caroline [name]         DATE OF BIRTH:  [birth year]  PATIENT ID:  [id number]    TYPE OF STUDY:  PSG  READING PHYSICIAN: TRUE MAR, MD, PhD   SCORING TECHNICIAN: Delon Sprung, RPSGT     Referred by: Dr. Eather Popp ? History and Indication for Testing: 68 year old female with an underlying medical history of hypercoagulable state, DVT (on Eliquis ), stroke in 2017, diabetes, carpal tunnel syndrome, reflux disease, and borderline obesity, who was previously diagnosed with obstructive sleep apnea and placed on PAP therapy. She has been compliant with her CPAP of 7 cm with EPR of 2 with good apnea control and good tolerance. She should be eligible for a new machine. Height: 63 in Weight: 175 lb (BMI 31) Neck Size: 15 in    MEDICATIONS: Eliquis , Vitamin C, Lipitor, Tessalon , Lotrisone, Flonase, Cozaar, Pepcid, Melatonin, Mounjaro, Prilosec, Zofran , Singulair, Glucotrol, Protonix, Kombiglyze XR.   TECHNICAL DESCRIPTION: A registered sleep technologist was in attendance for the duration of the recording.  Data collection, scoring, video monitoring, and reporting were performed in compliance with the AASM Manual for the Scoring of Sleep and Associated Events; (Hypopnea is scored based on the criteria listed in Section VIII D. 1b in the AASM Manual V2.6 using a 4% oxygen desaturation rule or Hypopnea is scored based on the criteria listed in Section VIII D. 1a in the AASM Manual V2.6 using 3% oxygen desaturation and /or arousal rule).   SLEEP CONTINUITY AND SLEEP ARCHITECTURE:  Lights-out was at 22:15: and lights-on at  05:46:, with a total recording time of 7 hours, 31 min. Total sleep time ( TST) was 429.5 minutes with a high sleep efficiency at 95.2%. There was  20.4% REM sleep.  BODY POSITION:  TST was divided  between the following  sleep positions: 2.4% supine;  97.6% lateral;  0% prone. Duration of total sleep and percent of total sleep in their respective position is as follows: supine 10 minutes (2%), non-supine 419 minutes (98%); right 103 minutes (24%), left 315 minutes (73%), and prone 00 minutes (0%).  Total supine REM sleep time was 00 minutes (0% of total REM sleep).  Sleep latency was decreased at 1.0 minutes.  REM sleep latency was mildly below normal at 61.0 minutes. Of the total sleep time, the percentage of stage N1 sleep was 0.5%, stage N2 sleep was 63%, which is mildly increased, stage N3 sleep was 16.1%, which is normal, and REM sleep was 20.4%, which is normal. Wake after sleep onset (WASO) time accounted for 20.5 minutes with minimal sleep fragmentation noted.   RESPIRATORY MONITORING:   Based on CMS criteria (using a 4% oxygen desaturation rule for scoring hypopneas), there were 12 apneas (11 obstructive; 1 central; 0 mixed), and 82 hypopneas.  Apnea index was 1.7. Hypopnea index was 11.5. The apnea-hypopnea index was 13.1/hour overall (5.7 supine, 49 non-supine; 48.7 REM, 0.0 supine REM).  There were 0 respiratory effort-related arousals (RERAs).  The RERA index was 0 events/h. Total respiratory disturbance index (RDI) was 13.1 events/h. RDI results showed: supine RDI  5.7 /h; non-supine RDI 13.3 /h; REM RDI 48.7 /h, supine REM RDI 0.0 /h.   Based on AASM criteria (using a 3% oxygen desaturation and /or arousal rule for scoring hypopneas), there were 12 apneas (11 obstructive; 1 central; 0 mixed),  and 82 hypopneas. Apnea index was 1.7. Hypopnea index was 11.5. The apnea-hypopnea index was 13.1 overall (5.7 supine, 49 non-supine; 48.7 REM, 0.0 supine REM).  There were 0 respiratory effort-related arousals (RERAs).  The RERA index was 0 events/h. Total respiratory disturbance index (RDI) was 13.1 events/h. RDI results showed: supine RDI  5.7 /h; non-supine RDI 13.3 /h; REM RDI 48.7 /h, supine REM RDI 0.0 /h.     OXIMETRY: Oxyhemoglobin Saturation Nadir during sleep was at  83% from a mean of 94%.  Of the Total sleep time (TST)   hypoxemia (=<88%) was present for  1.5 minutes, or 0.4% of total sleep time.  LIMB MOVEMENTS: There were 0 periodic limb movements of sleep (0.0/hr), of which 0 (0.0/hr) were associated with an arousal.   AROUSAL: There were 57 arousals in total, for an arousal index of 8 arousals/hour.  Of these, 9 were identified as respiratory-related arousals (1 /h), 0 were PLM-related arousals (0 /h), and 54 were non-specific arousals (8 /h).    EEG: Review of the EEG showed no abnormal electrical discharges and symmetrical bihemispheric findings.      EKG: The EKG revealed normal sinus rhythm (NSR). The average heart rate during sleep was 67 bpm.    AUDIO/VIDEO REVIEW: The audio and video review did not show any abnormal or unusual behaviors, movements, phonations or vocalizations. The patient took 1 restroom break. Snoring was noted, ranging from mild to louder.    POST-STUDY QUESTIONNAIRE: Post study, the patient indicated, that sleep was the same as usual.     IMPRESSION:    1. Mild Obstructive Sleep Apnea (OSA) 2. Dysfunctions associated with sleep stages or arousal from sleep   RECOMMENDATIONS:    1. This study demonstrates overall mild obstructive sleep apnea, most pronounced during REM sleep with a total AHI of 13.1/h, REM AHI of 48.7/h, supine AHI of 5.7/h, O2 nadir 83%.  The patient has been compliant with her CPAP of 7 cm with EPR of 2.  She should be eligible for a new machine.  I recommend starting her on home CPAP therapy at a pressure of 7 cm with EPR of 2 as prior settings have been well-tolerated with good apnea control. A full-night CPAP titration study would allow optimization of therapy if needed, down the road. Other treatment options may include avoidance of supine sleep position along with weight loss (where clinically feasible and applicable), or the use of an  oral appliance in selected patients.  Generally speaking, surgical treatment options are not recommended for mild obstructive sleep apnea. Please note, that untreated obstructive sleep apnea may carry additional perioperative morbidity. Patients with significant obstructive sleep apnea should receive perioperative PAP therapy and the surgeons and particularly the anesthesiologist should be informed of the diagnosis and the severity of the sleep disordered breathing. 2. This study shows normal sleep fragmentation and minimally abnormal sleep stage percentages; these are nonspecific findings and per se do not signify an intrinsic sleep disorder or a cause for the patient's sleep-related symptoms. Causes include (but are not limited to) the first night effect of the sleep study, circadian rhythm disturbances, medication effect or an underlying mood disorder or medical problem.  3. The patient should be cautioned not to drive, work at heights, or operate dangerous or heavy equipment when tired or sleepy. Review and reiteration of good sleep hygiene measures should be pursued with any patient. 4. The patient will be seen in follow-up in the Sleep Clinic at [facility] for discussion of  the test results, progress with treatment, and recheck on symptoms as well as potential further management strategies. The referring provider and the patient will be notified of the test results.    I certify that I have reviewed the entire raw data recording prior to the issuance of this report in accordance with the Standards of Accreditation of the American Academy of Sleep Medicine (AASM).  True Mar, MD, PhD Medical Director, [facility]) Diplomat, ABPN (Neurology and Sleep)              Technical Report:   General Information  Name: Caroline [name], Caroline [name] BMI: 31.00 Physician: TRUE MAR, MD  ID: [id number] Height: 63.0 in Technician: Delon Sprung, RPSGT  Sex: Female Weight:  175.0 lb Record: [id number]  Age: 59 [05-28-1955] Date: 10/29/2023    Medical & Medication History    CVA, Diabetes mellitus, left carpal tunnel, OSA on CPAP, Reflux Eliquis , Vitamin C, Lipitor, Tessalon , Lotrisone, Flonase, Cozaar, Pepcid, Melatonin, Mounjaro, Prilosec, Zofran , Singulair, Glucotrol, Protonix, Kombiglyze XR.   Sleep Disorder   OSA   Comments   Patient is a 68 y/o female who was referred to the sleep lab for an NPSG. Patient slept supine , and lateral. Patient had moderate to loud snoring noted. No PLMS. EKG appeared to be NSR with an elevated T wave. Patient had noted sleep apnea. One bathroom break. Patient had no complaints and tech answered all questions.     Lights out: 10:15:46 PM Lights on: 05:46:46 AM   Time Total Supine Side Prone Upright  Recording (TRT) 7h 31.51m 0h 11.94m 7h 19.9m 0h 0.67m 0h 0.88m  Sleep (TST) 7h 9.24m 0h 10.44m 6h 59.61m 0h 0.56m 0h 0.74m   Latency N1 N2 N3 REM Onset Per. Slp. Eff.  Actual 5h 31.20m 0h 0.80m 0h 1.57m 1h 1.64m 0h 1.65m 0h 1.56m 95.23%   Stg Dur Wake N1 N2 N3 REM  Total 21.5 2.0 271.0 69.0 87.5  Supine 1.0 0.0 10.5 0.0 0.0  Side 20.5 2.0 260.5 69.0 87.5  Prone 0.0 0.0 0.0 0.0 0.0  Upright 0.0 0.0 0.0 0.0 0.0   Stg % Wake N1 N2 N3 REM  Total 4.8 0.5 63.1 16.1 20.4  Supine 0.2 0.0 2.4 0.0 0.0  Side 4.5 0.5 60.7 16.1 20.4  Prone 0.0 0.0 0.0 0.0 0.0  Upright 0.0 0.0 0.0 0.0 0.0     Apnea Summary Sub Supine Side Prone Upright  Total 12 Total 12 0 12 0 0    REM 11 0 11 0 0    NREM 1 0 1 0 0  Obs 11 REM 10 0 10 0 0    NREM 1 0 1 0 0  Mix 0 REM 0 0 0 0 0    NREM 0 0 0 0 0  Cen 1 REM 1 0 1 0 0    NREM 0 0 0 0 0   Rera Summary Sub Supine Side Prone Upright  Total 0 Total 0 0 0 0 0    REM 0 0 0 0 0    NREM 0 0 0 0 0   Hypopnea Summary Sub Supine Side Prone Upright  Total 82 Total 82 1 81 0 0    REM 60 0 60 0 0    NREM 22 1 21  0 0   4% Hypopnea Summary Sub Supine Side Prone Upright  Total (4%) 82 Total 82 1 81 0 0    REM 60  0 60 0  0    NREM 22 1 21  0 0     AHI Total Obs Mix Cen  13.13 Apnea 1.68 1.54 0.00 0.14   Hypopnea 11.46 -- -- --  13.13 Hypopnea (4%) 11.46 -- -- --    Total Supine Side Prone Upright  Position AHI 13.13 5.71 13.32 0.00 0.00  REM AHI 48.69   NREM AHI 4.04   Position RDI 13.13 5.71 13.32 0.00 0.00  REM RDI 48.69   NREM RDI 4.04    4% Hypopnea Total Supine Side Prone Upright  Position AHI (4%) 13.13 5.71 13.32 0.00 0.00  REM AHI (4%) 48.69   NREM AHI (4%) 4.04   Position RDI (4%) 13.13 5.71 13.32 0.00 0.00  REM RDI (4%) 48.69   NREM RDI (4%) 4.04    Desaturation Information Threshold: 2% <100% <90% <80% <70% <60% <50% <40%  Supine 6.0 0.0 0.0 0.0 0.0 0.0 0.0  Side 201.0 20.0 0.0 0.0 0.0 0.0 0.0  Prone 0.0 0.0 0.0 0.0 0.0 0.0 0.0  Upright 0.0 0.0 0.0 0.0 0.0 0.0 0.0  Total 207.0 20.0 0.0 0.0 0.0 0.0 0.0  Index 27.6 2.7 0.0 0.0 0.0 0.0 0.0   Threshold: 3% <100% <90% <80% <70% <60% <50% <40%  Supine 2.0 0.0 0.0 0.0 0.0 0.0 0.0  Side 121.0 20.0 0.0 0.0 0.0 0.0 0.0  Prone 0.0 0.0 0.0 0.0 0.0 0.0 0.0  Upright 0.0 0.0 0.0 0.0 0.0 0.0 0.0  Total 123.0 20.0 0.0 0.0 0.0 0.0 0.0  Index 16.4 2.7 0.0 0.0 0.0 0.0 0.0   Threshold: 4% <100% <90% <80% <70% <60% <50% <40%  Supine 1.0 0.0 0.0 0.0 0.0 0.0 0.0  Side 92.0 20.0 0.0 0.0 0.0 0.0 0.0  Prone 0.0 0.0 0.0 0.0 0.0 0.0 0.0  Upright 0.0 0.0 0.0 0.0 0.0 0.0 0.0  Total 93.0 20.0 0.0 0.0 0.0 0.0 0.0  Index 12.4 2.7 0.0 0.0 0.0 0.0 0.0   Threshold: 4% <100% <90% <80% <70% <60% <50% <40%  Supine 1 0 0 0 0 0 0  Side 92 20 0 0 0 0 0  Prone 0 0 0 0 0 0 0  Upright 0 0 0 0 0 0 0  Total 93 20 0 0 0 0 0   Awakening/Arousal Information # of Awakenings 11  Wake after sleep onset 20.15m  Wake after persistent sleep 20.51m   Arousal Assoc. Arousals Index  Apneas 1 0.1  Hypopneas 9 1.3  Leg Movements 1 0.1  Snore 0 0.0  PTT Arousals 0 0.0  Spontaneous 54 7.5  Total 65 9.1  Leg Movement Information PLMS LMs Index  Total LMs during  PLMS 0 0.0  LMs w/ Microarousals 0 0.0   LM LMs Index  w/ Microarousal 1 0.1  w/ Awakening 0 0.0  w/ Resp Event 0 0.0  Spontaneous 1 0.1  Total 2 0.3     Desaturation threshold setting: 4% Minimum desaturation setting: 10 seconds SaO2 nadir: 67% The longest event was a 74 sec obstructive Hypopnea with a minimum SaO2 of 90%. The lowest SaO2 was 83% associated with a 28 sec obstructive Hypopnea. EKG Rates EKG Avg Max Min  Awake 73 93 55  Asleep 67 92 55  EKG Events: Tachycardia

## 2023-11-02 NOTE — Addendum Note (Signed)
 Addended by: Nyelle Wolfson on: 11/02/2023 04:40 PM   Modules accepted: Orders

## 2023-11-09 NOTE — Telephone Encounter (Signed)
-----   Message from True Mar sent at 11/02/2023  4:40 PM EDT ----- Patient referred by Dr. Rosemarie for re-eval of her OSA. She was seen on 08/04/23 and had an HST first, which did not confirm her OSA, then a sleep study in the lab on 10/29/23, which confirmed mild OSA.  The patient should be eligible for a new machine, which I will prescribe. I have placed an order in the chart. Please send referral, talk to patient. She will need a FU in sleep clinic in about 2-3  months post-PAP set up (which is usually an insurance-mandated appointment to monitor compliance), please arrange that with me or one of our NPs. Please also go over the need for compliance with  treatment (including the insurance-imposed minimum compliance percentage). Thanks,   True Mar, MD, PhD [facility])    ----- Message ----- From: Mar True, MD Sent: 11/02/2023   4:37 PM EDT To: True Mar, MD

## 2023-11-09 NOTE — Telephone Encounter (Signed)
 I called the patient and discussed her sleep study results as noted below by Dr Buck. The patient verbalized understanding. She is concerned about which DME is cheaper so she will call [facility] and [facility] and then get back to us  with whom she has chosen. We will then send the orders to DME for new machine and supplies. Pt's questions were answered and she thanked me for the call.

## 2023-11-17 NOTE — Telephone Encounter (Signed)
Referral sent to [facility]. 

## 2023-11-18 NOTE — Telephone Encounter (Signed)
 New, Maryella Shivers, Otilio Jefferson, RN; Alain Honey; Jeris Penta, [place]; 1 other Received, thank you!

## 2023-12-03 ENCOUNTER — Encounter (HOSPITAL_COMMUNITY): Payer: Self-pay | Admitting: Emergency Medicine

## 2023-12-03 ENCOUNTER — Other Ambulatory Visit: Payer: Self-pay

## 2023-12-03 ENCOUNTER — Emergency Department (HOSPITAL_COMMUNITY)

## 2023-12-03 ENCOUNTER — Emergency Department (HOSPITAL_COMMUNITY)
Admission: EM | Admit: 2023-12-03 | Discharge: 2023-12-04 | Disposition: A | Discharge status: Home / Self Care | Attending: Emergency Medicine | Admitting: Emergency Medicine

## 2023-12-03 DIAGNOSIS — M25562 Pain in left knee: Secondary | ICD-10-CM | POA: Insufficient documentation

## 2023-12-03 DIAGNOSIS — Z7901 Long term (current) use of anticoagulants: Secondary | ICD-10-CM | POA: Diagnosis not present

## 2023-12-03 DIAGNOSIS — M79662 Pain in left lower leg: Secondary | ICD-10-CM | POA: Diagnosis not present

## 2023-12-03 NOTE — ED Triage Notes (Signed)
 Pt in with L posterior knee pain that began around 1430. States she was standing in the bathroom and heard a loud pop - denies any twisting or bending motion. Has not been able to bear weight on leg since. Takes Eliquis 

## 2023-12-03 NOTE — ED Triage Notes (Signed)
 Pt states she was standing in bathroom and her left knee randomly popped. Pt states she can not bear weight on knee

## 2023-12-04 ENCOUNTER — Emergency Department (HOSPITAL_COMMUNITY)

## 2023-12-04 DIAGNOSIS — M79662 Pain in left lower leg: Secondary | ICD-10-CM | POA: Diagnosis not present

## 2023-12-04 MED ORDER — LIDOCAINE 5 % EX PTCH: 1.0000 | MEDICATED_PATCH | CUTANEOUS | 0 refills | Status: DC

## 2023-12-04 MED ORDER — FENTANYL CITRATE PF 50 MCG/ML IJ SOSY
25.0000 ug | PREFILLED_SYRINGE | Freq: Once | INTRAMUSCULAR | Status: AC
  Administered 2023-12-04: 25 ug via INTRAMUSCULAR
  Filled 2023-12-04: qty 1

## 2023-12-04 MED ORDER — LIDOCAINE 5 % EX PTCH
3.0000 | MEDICATED_PATCH | CUTANEOUS | Status: DC
  Administered 2023-12-04: 3 via TRANSDERMAL
  Filled 2023-12-04: qty 3

## 2023-12-04 MED ORDER — ACETAMINOPHEN 500 MG PO TABS
1000.0000 mg | ORAL_TABLET | Freq: Once | ORAL | Status: AC
  Administered 2023-12-04: 1000 mg via ORAL
  Filled 2023-12-04: qty 2

## 2023-12-04 NOTE — Progress Notes (Signed)
 Orthopedic Tech Progress Note Patient Details:  Caroline [name] [birth year] [id number]  Ortho Devices Type of Ortho Device: Knee Immobilizer Ortho Device/Splint Location: LLE Ortho Device/Splint Interventions: Ordered, Application, Adjustment   Post Interventions Patient Tolerated: Well Instructions Provided: Care of device Supplied patient with walker. RN approved of the request.  [name] 12/04/2023, 6:28 AM

## 2023-12-04 NOTE — ED Provider Notes (Signed)
 Patient seen after prior ED provider.  Patient understands workup results.  Patient is comfortable and desires discharge.    Importance of close follow-up is stressed.  Strict return precautions given understood.   Laurice Maude BROCKS, MD 12/04/23 [phone]

## 2023-12-04 NOTE — Progress Notes (Signed)
 LLE venous duplex has been completed.  Preliminary results given to Dr. Laurice.   Results can be found under chart review under CV PROC. 12/04/2023 9:33 AM Rhyse Skowron RVT, RDMS

## 2023-12-04 NOTE — ED Provider Notes (Signed)
 [place] EMERGENCY DEPARTMENT AT [facility] Provider Note   CSN: [id number] Arrival date & time: 12/03/23  1801     Patient presents with: Knee Pain   Caroline [name] is a 68 y.o. female.   The history is provided by the patient.  Knee Pain Location:  Knee Time since incident:  13 hours Injury: no (walking and felt a pop)   Pain details:    Quality:  Aching   Radiates to:  Does not radiate   Severity:  Severe   Onset quality:  Sudden   Timing:  Constant   Progression:  Unchanged Chronicity:  New Dislocation: no   Prior injury to area:  No Relieved by:  Nothing Worsened by:  Nothing Ineffective treatments:  None tried Associated symptoms: no back pain, no fever, no swelling and no tingling   Risk factors: no concern for non-accidental trauma        Prior to Admission medications   Medication Sig Start Date End Date Taking? Authorizing Provider  apixaban  (ELIQUIS ) 2.5 MG TABS tablet Take 2.5 mg by mouth 2 (two) times daily. 01/14/18   [provider]  Ascorbic Acid (VITAMIN C GUMMIES PO) Take 1,000 mg by mouth daily.     [provider]  atorvastatin  (LIPITOR) 10 MG tablet Take 1 tablet (10 mg total) by mouth daily at 6 PM. 01/08/16   Rinehuls, Alm CROME, PA-C  benzonatate  (TESSALON ) 100 MG capsule Take 1 capsule (100 mg total) by mouth every 8 (eight) hours. 04/15/23   Smoot, Lauraine LABOR, PA-C  Blood Glucose Monitoring Suppl (ONETOUCH VERIO FLEX SYSTEM) w/Device KIT daily. 07/05/23   [provider]  clotrimazole-betamethasone (LOTRISONE) cream Apply 1 application topically as needed (itching).  06/28/17   [provider]  DOCUSATE SODIUM PO Take 100 mg by mouth daily.     [provider]  famotidine (PEPCID) 20 MG tablet Take 1 tablet by mouth daily. 12/19/19   [provider]  fluticasone OREN) 50 MCG/ACT nasal spray Use 2 sprays in each nostril once daily 09/29/16   [provider]  glipiZIDE  (GLUCOTROL) 10 MG tablet Take 1 tablet by mouth in the morning and at bedtime. Patient not taking: Reported on 08/04/2023 09/02/19   [provider]  losartan (COZAAR) 25 MG tablet Take 25 mg by mouth daily. as directed 03/25/16   [provider]  Melatonin 10 MG TABS Take 10 mg by mouth as needed (sleep).     [provider]  montelukast (SINGULAIR) 10 MG tablet Take 10 mg by mouth at bedtime.  Patient not taking: Reported on 08/04/2023 04/05/17   [provider]  MOUNJARO 12.5 MG/0.5ML Pen Inject 12.5 mg into the skin once a week.    [provider]  Multiple Vitamin (MULTIVITAMIN WITH MINERALS) TABS tablet Take 1 tablet by mouth daily.    [provider]  Multiple Vitamins-Minerals (MULTIVITAMIN ADULTS PO) Take 1 tablet by mouth daily.     [provider]  nystatin (MYCOSTATIN/NYSTOP) powder  06/14/17   [provider]  omeprazole (PRILOSEC) 40 MG capsule Take 40 mg by mouth 2 (two) times daily.    [provider]  ondansetron  (ZOFRAN -ODT) 4 MG disintegrating tablet Take 1 tablet (4 mg total) by mouth every 8 (eight) hours as needed for nausea or vomiting. 04/15/23   Smoot, Lauraine LABOR, PA-C  pantoprazole (PROTONIX) 40 MG tablet Take 40 mg by mouth daily. Patient not taking: Reported on 07/21/2023  [provider]  prednisoLONE acetate (PRED FORTE) 1 % ophthalmic suspension Place 1 drop into the left eye in the morning and at bedtime. Patient not taking: Reported on 08/04/2023 08/30/19   [provider]  sAXagliptin-metFORMIN (KOMBIGLYZE XR PO) Take 1,000 mg by mouth daily.  Patient not taking: Reported on 08/04/2023    [provider]  valACYclovir (VALTREX) 500 MG tablet Take 1 tablet by mouth daily. 09/21/19   [provider]    Allergies: Amoxicillin, Azithromycin, Homatropine, Hydrocodone  bit-homatrop mbr, Penicillin g, and Tape    Review of Systems  Constitutional:  Negative for  fever.  Cardiovascular:  Negative for palpitations.  Musculoskeletal:  Positive for arthralgias. Negative for back pain.  All other systems reviewed and are negative.   Updated Vital Signs BP 133/87 (BP Location: Left Arm)   Pulse 79   Temp 98.7 F (37.1 C)   Resp 16   Ht 5' 3 (1.6 m)   Wt 79.3 kg   SpO2 100%   BMI 30.97 kg/m   Physical Exam Vitals and nursing note reviewed.  Constitutional:      General: She is not in acute distress.    Appearance: Normal appearance. She is well-developed.  HENT:     Head: Normocephalic and atraumatic.     Nose: Nose normal.  Eyes:     Pupils: Pupils are equal, round, and reactive to light.  Cardiovascular:     Rate and Rhythm: Normal rate and regular rhythm.     Pulses: Normal pulses.     Heart sounds: Normal heart sounds.  Pulmonary:     Effort: Pulmonary effort is normal. No respiratory distress.     Breath sounds: Normal breath sounds.  Abdominal:     General: Bowel sounds are normal. There is no distension.     Palpations: Abdomen is soft.     Tenderness: There is no abdominal tenderness. There is no guarding or rebound.  Musculoskeletal:        General: Normal range of motion.     Cervical back: Normal range of motion and neck supple.  Skin:    General: Skin is warm and dry.     Capillary Refill: Capillary refill takes less than 2 seconds.     Findings: No erythema or rash.  Neurological:     General: No focal deficit present.     Mental Status: She is alert.     Deep Tendon Reflexes: Reflexes normal.  Psychiatric:        Mood and Affect: Mood normal.     (all labs ordered are listed, but only abnormal results are displayed) Labs Reviewed - No data to display  EKG: None  Radiology: DG Knee Complete 4 Views Left Result Date: 12/03/2023 CLINICAL DATA:  Left knee pain. EXAM: LEFT KNEE - COMPLETE 4+ VIEW COMPARISON:  None Available. FINDINGS: No evidence of an acute fracture, dislocation, or joint effusion. 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 No evidence of arthropathy or other focal bone abnormality. Soft tissues are unremarkable. IMPRESSION: No acute osseous abnormality. Electronically Signed   By: Suzen Dials M.D.   On: 12/03/2023 20:36     Procedures   Medications Ordered in the ED  lidocaine  (LIDODERM ) 5 % 3 patch (3 patches Transdermal Patch Applied 12/04/23 0509)  acetaminophen  (TYLENOL ) tablet 1,000 mg (1,000 mg Oral Given 12/04/23 0510)  fentaNYL  (SUBLIMAZE ) injection 25 mcg (25 mcg Intramuscular Given 12/04/23 0516)  Medical Decision Making Amount and/or Complexity of Data Reviewed Radiology: ordered and independent interpretation performed.    Details: No fractures   Risk OTC drugs. Prescription drug management. Risk Details: Patient with pain on DOAC.  Will exclude DVT.  Immobilizer placed.  If negative for DVT will need to follow up with orthopedics as an outpatient     Final diagnoses:  Acute pain of left knee   Signed out Dr. Laurice pending DVT study,  ED Discharge Orders     None          Arnie Clingenpeel, MD 12/04/23 [phone]

## 2023-12-04 NOTE — Discharge Instructions (Signed)
 Return for any problem.  Follow up with Orthopedics (Dr. Kendal) as instructed.

## 2023-12-13 ENCOUNTER — Telehealth: Payer: Self-pay | Admitting: Neurology

## 2023-12-13 DIAGNOSIS — G4733 Obstructive sleep apnea (adult) (pediatric): Secondary | ICD-10-CM

## 2023-12-13 NOTE — Telephone Encounter (Signed)
 Pt was told from DME that they need Rx for ResMed, once they get they will notify pt so she can come in for her CPAP

## 2023-12-13 NOTE — Telephone Encounter (Signed)
 Spoke to patient wants a resmed .  message forward to [employer]

## 2023-12-13 NOTE — Telephone Encounter (Signed)
 Order placed. To be signed by Dr Buck.

## 2023-12-13 NOTE — Addendum Note (Signed)
 Addended by: SHONA SAVANT A on: 12/13/2023 03:59 PM   Modules accepted: Orders

## 2024-02-07 ENCOUNTER — Ambulatory Visit: Payer: Self-pay | Admitting: Orthopedic Surgery

## 2024-02-07 NOTE — H&P (Signed)
 Caroline [name] is an 68 y.o. female.   Chief Complaint: left knee pain HPI: Visit For: referral from Dr. Heide Location: left; knee Duration: date of onset: (12/04/2023) Severity: pain level 9/10 Timing: acute Context: opening a door and heard/felt a pop Alleviating Factors: brace Aggravating Factors: weightbearing Associated Symptoms: swelling; popping/clicking; instability Prior Imaging: x ray (in [place]); MRI Previous Injections: did not help; 12/06/2023 left knee - Kenalog   Patient turned and felt a pop. Having pain with weightbearing no significant problems prior to this injury seen by Dr. Heide had a cortisone injection as it did not help. Still to have a high-grade tear of the medial meniscus with some displacement of the medial gutter. Some patellofemoral arthrosis and a moderate joint effusion.  Past Medical History:  Diagnosis Date   CVA (cerebral vascular accident) (HCC)    Diabetes mellitus without complication (HCC)    Left carpal tunnel syndrome 07/13/2017   OSA on CPAP    Reflux     Past Surgical History:  Procedure Laterality Date   ABDOMINAL HYSTERECTOMY     APPENDECTOMY     EP IMPLANTABLE DEVICE N/A 01/08/2016   Procedure: Loop Recorder Insertion;  Surgeon: Danelle LELON Birmingham, MD;  Location: [facility];  Service: Cardiovascular;  Laterality: N/A;   IR GENERIC HISTORICAL  01/02/2016   IR ANGIO VERTEBRAL SEL VERTEBRAL UNI L MOD SED 01/02/2016 Thyra Nash, MD [facility]   IR GENERIC HISTORICAL  01/02/2016   IR ANGIO INTRA EXTRACRAN SEL COM CAROTID INNOMINATE BILAT MOD SED 01/02/2016 Thyra Nash, MD [facility]   IR GENERIC HISTORICAL  01/02/2016   IR ANGIO VERTEBRAL SEL SUBCLAVIAN INNOMINATE UNI R MOD SED 01/02/2016 Thyra Nash, MD [facility]   left cyst from breast remove     RADIOLOGY WITH ANESTHESIA N/A 01/02/2016   Procedure: RADIOLOGY WITH ANESTHESIA;  Surgeon: Medication Radiologist, MD;  Location: [facility] OR;  Service: Radiology;   Laterality: N/A;   TEE WITHOUT CARDIOVERSION N/A 01/08/2016   Procedure: TRANSESOPHAGEAL ECHOCARDIOGRAM (TEE);  Surgeon: Ezra GORMAN Shuck, MD;  Location: [facility] ENDOSCOPY;  Service: Cardiovascular;  Laterality: N/A;    Family History  Problem Relation Age of Onset   Atrial fibrillation Mother    Suicidality Father        1971   Stroke Maternal Grandfather    Social History:  reports that she has never smoked. She has never used smokeless tobacco. She reports that she does not drink alcohol and does not use drugs.  Allergies:  Allergies  Allergen Reactions   Amoxicillin Rash   Azithromycin Rash   Homatropine Rash   Hydrocodone  Bit-Homatrop Mbr Rash   Penicillin G Rash    Dyspepsia, ingestion   Tape Rash    (Not in a hospital admission)   No results found for this or any previous visit (from the past 48 hours). No results found.  Review of Systems  Constitutional: Negative.   HENT: Negative.    Eyes: Negative.   Respiratory: Negative.    Cardiovascular: Negative.   Gastrointestinal: Negative.   Endocrine: Negative.   Genitourinary: Negative.   Musculoskeletal:  Positive for arthralgias, gait problem and joint swelling.  Skin: Negative.   Psychiatric/Behavioral: Negative.      There were no vitals taken for this visit. Physical Exam Constitutional:      Appearance: Normal appearance.  HENT:     Head: Normocephalic and atraumatic.     Right Ear: External ear normal.     Left Ear: External ear  normal.     Nose: Nose normal.     Mouth/Throat:     Pharynx: Oropharynx is clear.  Eyes:     Conjunctiva/sclera: Conjunctivae normal.  Cardiovascular:     Rate and Rhythm: Normal rate.     Pulses: Normal pulses.  Pulmonary:     Effort: Pulmonary effort is normal.  Abdominal:     General: Bowel sounds are normal.  Musculoskeletal:     Cervical back: Normal range of motion.     Comments: Tender palpation of the medial joint line as patellofemoral pain compression trace  effusion range 0-1 40 equivocal McMurray. Ipsilateral hip and ankle exam is unremarkable. No DVT  Skin:    General: Skin is warm and dry.  Neurological:     Mental Status: She is alert.    X-rays of the knee 4 view demonstrate moderate medial joint space narrowing.  Assessment/Plan Impression:  1. Symptomatic medial meniscus tear of the left knee 2. Osteoarthritis left knee 3. 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 4. History of a stroke 5. History of osteopenia  Plan:  We discussed options living with her symptoms versus surgical intervention. Arthroscopy versus total knee. I would favor the arthroscopy given no problems with her knee prior to this injury and with mechanical symptoms and underlying meniscus tear. Also with her history of stroke and DVT this is a less invasive option however they do understand that this may not relieve her symptoms and eventually require total knee replacement.  I had an extensive discussion with the patient concerning their pathology and relevant anatomy and current treatment options. We discussed living with the symptoms versus consideration of a total knee replacement. We mutually agreed to proceed with the latter. I discussed the risk and benefits including bleeding, infection, damage to neurovascular structures, DVT, PE, arthrofibrosis limiting postoperative motion, possible need for a manipulation, component failure, possible need for revision in the future. A surgical procedure as well as a complement to utilize was discussed as well.  In addition we discussed the hospital course as well as the postoperative treatment including 2-3 days in the hospital followed by 2 weeks for suture removal. Initiation of immediate physical therapy in the hospital and as an outpatient. The need for anticoagulation. The need for aggressive elevation to limit swelling. The recovery period to include potentially 3 months.  We will obtain preoperative clearance if  necessary and proceed accordingly.  Quadricep exercises were discussed in detail.  Assistive device. Preoperative clearance.  No history of MRSA. Oxycodone. Eliquis  postoperatively.  Plan L knee scope, partial medial meniscectomy, debridement  Darice CHRISTELLA Randy, PA-C for Dr Duwayne 02/07/2024, 9:20 AM

## 2024-02-07 NOTE — Progress Notes (Signed)
 Sent message, via epic in basket, requesting orders in epic from Careers adviser.

## 2024-02-07 NOTE — H&P (View-Only) (Signed)
 Caroline [name] is an 68 y.o. female.   Chief Complaint: left knee pain HPI: Visit For: referral from Dr. Heide Location: left; knee Duration: date of onset: (12/04/2023) Severity: pain level 9/10 Timing: acute Context: opening a door and heard/felt a pop Alleviating Factors: brace Aggravating Factors: weightbearing Associated Symptoms: swelling; popping/clicking; instability Prior Imaging: x ray (in [place]); MRI Previous Injections: did not help; 12/06/2023 left knee - Kenalog   Patient turned and felt a pop. Having pain with weightbearing no significant problems prior to this injury seen by Dr. Heide had a cortisone injection as it did not help. Still to have a high-grade tear of the medial meniscus with some displacement of the medial gutter. Some patellofemoral arthrosis and a moderate joint effusion.  Past Medical History:  Diagnosis Date   CVA (cerebral vascular accident) (HCC)    Diabetes mellitus without complication (HCC)    Left carpal tunnel syndrome 07/13/2017   OSA on CPAP    Reflux     Past Surgical History:  Procedure Laterality Date   ABDOMINAL HYSTERECTOMY     APPENDECTOMY     EP IMPLANTABLE DEVICE N/A 01/08/2016   Procedure: Loop Recorder Insertion;  Surgeon: Danelle LELON Birmingham, MD;  [facility]: [facility];  Service: Cardiovascular;  Laterality: N/A;   IR GENERIC HISTORICAL  01/02/2016   IR ANGIO VERTEBRAL SEL VERTEBRAL UNI L MOD SED 01/02/2016 Thyra Nash, MD [facility]   IR GENERIC HISTORICAL  01/02/2016   IR ANGIO INTRA EXTRACRAN SEL COM CAROTID INNOMINATE BILAT MOD SED 01/02/2016 Thyra Nash, MD [facility]   IR GENERIC HISTORICAL  01/02/2016   IR ANGIO VERTEBRAL SEL SUBCLAVIAN INNOMINATE UNI R MOD SED 01/02/2016 Thyra Nash, MD [facility]   left cyst from breast remove     RADIOLOGY WITH ANESTHESIA N/A 01/02/2016   Procedure: RADIOLOGY WITH ANESTHESIA;  Surgeon: Medication Radiologist, MD;  Location: [facility];  Service: Radiology;   Laterality: N/A;   TEE WITHOUT CARDIOVERSION N/A 01/08/2016   Procedure: TRANSESOPHAGEAL ECHOCARDIOGRAM (TEE);  Surgeon: Ezra GORMAN Shuck, MD;  Location: [facility] ENDOSCOPY;  Service: Cardiovascular;  Laterality: N/A;    Family History  Problem Relation Age of Onset   Atrial fibrillation Mother    Suicidality Father        1971   Stroke Maternal Grandfather    Social History:  reports that she has never smoked. She has never used smokeless tobacco. She reports that she does not drink alcohol and does not use drugs.  Allergies:  Allergies  Allergen Reactions   Amoxicillin Rash   Azithromycin Rash   Homatropine Rash   Hydrocodone  Bit-Homatrop Mbr Rash   Penicillin G Rash    Dyspepsia, ingestion   Tape Rash    (Not in a hospital admission)   No results found for this or any previous visit (from the past 48 hours). No results found.  Review of Systems  Constitutional: Negative.   HENT: Negative.    Eyes: Negative.   Respiratory: Negative.    Cardiovascular: Negative.   Gastrointestinal: Negative.   Endocrine: Negative.   Genitourinary: Negative.   Musculoskeletal:  Positive for arthralgias, gait problem and joint swelling.  Skin: Negative.   Psychiatric/Behavioral: Negative.      There were no vitals taken for this visit. Physical Exam Constitutional:      Appearance: Normal appearance.  HENT:     Head: Normocephalic and atraumatic.     Right Ear: External ear normal.     Left Ear: External ear  normal.     Nose: Nose normal.     Mouth/Throat:     Pharynx: Oropharynx is clear.  Eyes:     Conjunctiva/sclera: Conjunctivae normal.  Cardiovascular:     Rate and Rhythm: Normal rate.     Pulses: Normal pulses.  Pulmonary:     Effort: Pulmonary effort is normal.  Abdominal:     General: Bowel sounds are normal.  Musculoskeletal:     Cervical back: Normal range of motion.     Comments: Tender palpation of the medial joint line as patellofemoral pain compression trace  effusion range 0-1 40 equivocal McMurray. Ipsilateral hip and ankle exam is unremarkable. No DVT  Skin:    General: Skin is warm and dry.  Neurological:     Mental Status: She is alert.    X-rays of the knee 4 view demonstrate moderate medial joint space narrowing.  Assessment/Plan Impression:  1. Symptomatic medial meniscus tear of the left knee 2. Osteoarthritis left knee 3. 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 4. History of a stroke 5. History of osteopenia  Plan:  We discussed options living with her symptoms versus surgical intervention. Arthroscopy versus total knee. I would favor the arthroscopy given no problems with her knee prior to this injury and with mechanical symptoms and underlying meniscus tear. Also with her history of stroke and DVT this is a less invasive option however they do understand that this may not relieve her symptoms and eventually require total knee replacement.  I had an extensive discussion with the patient concerning their pathology and relevant anatomy and current treatment options. We discussed living with the symptoms versus consideration of a total knee replacement. We mutually agreed to proceed with the latter. I discussed the risk and benefits including bleeding, infection, damage to neurovascular structures, DVT, PE, arthrofibrosis limiting postoperative motion, possible need for a manipulation, component failure, possible need for revision in the future. A surgical procedure as well as a complement to utilize was discussed as well.  In addition we discussed the hospital course as well as the postoperative treatment including 2-3 days in the hospital followed by 2 weeks for suture removal. Initiation of immediate physical therapy in the hospital and as an outpatient. The need for anticoagulation. The need for aggressive elevation to limit swelling. The recovery period to include potentially 3 months.  We will obtain preoperative clearance if  necessary and proceed accordingly.  Quadricep exercises were discussed in detail.  Assistive device. Preoperative clearance.  No history of MRSA. Oxycodone. Eliquis  postoperatively.  Plan L knee scope, partial medial meniscectomy, debridement  Darice CHRISTELLA Randy, PA-C for Dr Duwayne 02/07/2024, 9:20 AM

## 2024-02-13 NOTE — Patient Instructions (Signed)
 SURGICAL WAITING ROOM VISITATION Patients having surgery or a procedure may have no more than 2 support people in the waiting area - these visitors may rotate in the visitor waiting room.   If the patient needs to stay at the hospital during part of their recovery, the visitor guidelines for inpatient rooms apply.  PRE-OP VISITATION  Pre-op nurse will coordinate an appropriate time for 1 support person to accompany the patient in pre-op.  This support person may not rotate.  This visitor will be contacted when the time is appropriate for the visitor to come back in the pre-op area.  Please refer to the [facility] website for the visitor guidelines for Inpatients (after your surgery is over and you are in a regular room).  You are not required to quarantine at this time prior to your surgery. However, you must do this: Hand Hygiene often Do NOT share personal items Notify your provider if you are in close contact with someone who has COVID or you develop fever 100.4 or greater, new onset of sneezing, cough, sore throat, shortness of breath or body aches.  If you test positive for Covid or have been in contact with anyone that has tested positive in the last 10 days please notify you surgeon.    Your procedure is scheduled on:  FRIDAY February 18, 2024  Report to [facility] Main Entrance: [address] entrance where the [employer] is available.   Report to admitting at:  10:15   AM  Call this number if you have any questions or problems the morning of surgery [phone]  FOLLOW ANY ADDITIONAL PRE OP INSTRUCTIONS YOU RECEIVED FROM YOUR SURGEON'S OFFICE!!!  Do not eat food after Midnight the night prior to your surgery/procedure.  After Midnight you may have the following liquids until  09:30  AM DAY OF SURGERY  Clear Liquid Diet Water Black Coffee (sugar ok, NO MILK/CREAM OR CREAMERS)  Tea (sugar ok, NO MILK/CREAM OR CREAMERS) regular and decaf                              Plain Jell-O  with no fruit (NO RED)                                           Fruit ices (not with fruit pulp, NO RED)                                     Popsicles (NO RED)                                                                  Juice: NO CITRUS JUICES: only apple, WHITE grape, WHITE cranberry Sports drinks like Gatorade or Powerade (NO RED)                   The day of surgery:  Drink ONE (1) Pre-Surgery G2 at  09:30 AM the morning of surgery. Drink in one sitting. Do not sip.  This drink was given to you  during your hospital pre-op appointment visit. Nothing else to drink after completing the Pre-Surgery G2 : No candy, chewing gum or throat lozenges.     Oral Hygiene is also important to reduce your risk of infection.        Remember - BRUSH YOUR TEETH THE MORNING OF SURGERY WITH YOUR REGULAR TOOTHPASTE  Do NOT smoke after Midnight the night before surgery.  STOP TAKING all Vitamins, Herbs and supplements 1 week before your surgery.   MOUNJARO- Already on hold,  last dose was on: ????  Take ONLY these medicines the morning of surgery with A SIP OF WATER: Omeprazole, fexofenadine and Nifedipine (Procardia) q hs????, You may use your Flonase nasal spray and Albuterol inhaler if needed.    DO NOT TAKE LOSARTAN the morning of your surgery.   If You have been diagnosed with Sleep Apnea - Bring CPAP mask and tubing day of surgery. We will provide you with a CPAP machine on the day of your surgery.                   You may not have any metal on your body including hair pins, jewelry, and body piercing  Do not wear make-up, lotions, powders, perfumes or deodorant  Do not wear nail polish including gel and S&S, artificial / acrylic nails, or any other type of covering on natural nails including finger and toenails. If you have artificial nails, gel coating, etc., that needs to be removed by a nail salon, Please have this removed prior to surgery. Not doing so may mean that  your surgery could be cancelled or delayed if the Surgeon or anesthesia staff feels like they are unable to monitor you safely.   Do not shave 48 hours prior to surgery to avoid nicks in your skin which may contribute to postoperative infections.   Contacts, Hearing Aids, dentures or bridgework may not be worn into surgery. DENTURES WILL BE REMOVED PRIOR TO SURGERY PLEASE DO NOT APPLY Poly grip OR ADHESIVES!!!  You may bring a small overnight bag with you on the day of surgery, only pack items that are not valuable. [facility] IS NOT RESPONSIBLE   FOR VALUABLES THAT ARE LOST OR STOLEN.   Do not bring your home medications to the hospital. The Pharmacy will dispense medications listed on your medication list to you during your admission in the Hospital.   Please read over the following fact sheets you were given: IF YOU HAVE QUESTIONS ABOUT YOUR PRE-OP INSTRUCTIONS, PLEASE CALL [phone]   [facility] - Preparing for Surgery        Before surgery, you can play an important role.  Because skin is not sterile, your skin needs to be as free of germs as possible.  You can reduce the number of germs on your skin by washing with CHG (chlorahexidine gluconate) soap before surgery.  CHG is an antiseptic cleaner which kills germs and bonds with the skin to continue killing germs even after washing. Please DO NOT use if you have an allergy to CHG or antibacterial soaps.  If your skin becomes reddened/irritated stop using the CHG and inform your nurse when you arrive at Short Stay. Do not shave (including legs and underarms) for at least 48 hours prior to the first CHG shower.  You may shave your face/neck.  Please follow these instructions carefully:  1.  Shower with CHG Soap the night before surgery ONLY (DO NOT USE THE CHG SOAP THE MORNING OF  SURGERY).  2.  If you choose to wash your hair, wash your hair first as usual with your normal  shampoo.  3.  After you shampoo, rinse your hair and body  thoroughly to remove the shampoo.                             4.  Use CHG as you would any other liquid soap.  You can apply chg directly to the skin and wash.  Gently with a scrungie or clean washcloth.  5.  Apply the CHG Soap to your body ONLY FROM THE NECK DOWN.   Do not use on face/ open                           Wound or open sores. Avoid contact with eyes, ears mouth and genitals (private parts).                       Wash face,  Genitals (private parts) with your normal soap.             6.  Wash thoroughly, paying special attention to the area where your  surgery  will be performed.  7.  Thoroughly rinse your body with warm water from the neck down.  8.  DO NOT shower/wash with your normal soap after using and rinsing off the CHG Soap.                9.  Pat yourself dry with a clean towel.            10.  Wear clean pajamas.            11.  Place clean sheets on your bed the night of your first shower and do not  sleep with pets.  Day of Surgery : Do not apply any CHG, lotions/deodorants the morning of surgery.  Please wear clean clothes to the hospital/surgery center.   FAILURE TO FOLLOW THESE INSTRUCTIONS MAY RESULT IN THE CANCELLATION OF YOUR SURGERY  PATIENT SIGNATURE_________________________________  NURSE SIGNATURE__________________________________  ________________________________________________________________________      Caroline [name]    An incentive spirometer is a tool that can help keep your lungs clear and active. This tool measures how well you are filling your lungs with each breath. Taking long deep breaths may help reverse or decrease the chance of developing breathing (pulmonary) problems (especially infection) following: A long period of time when you are unable to move or be active. BEFORE THE PROCEDURE  If the spirometer includes an indicator to show your best effort, your nurse or respiratory therapist will set it to a desired goal. If  possible, sit up straight or lean slightly forward. Try not to slouch. Hold the incentive spirometer in an upright position. INSTRUCTIONS FOR USE  Sit on the edge of your bed if possible, or sit up as far as you can in bed or on a chair. Hold the incentive spirometer in an upright position. Breathe out normally. Place the mouthpiece in your mouth and seal your lips tightly around it. Breathe in slowly and as deeply as possible, raising the piston or the ball toward the top of the column. Hold your breath for 3-5 seconds or for as long as possible. Allow the piston or ball to fall to the bottom of the column. Remove the mouthpiece from your mouth and breathe out normally. Rest  for a few seconds and repeat Steps 1 through 7 at least 10 times every 1-2 hours when you are awake. Take your time and take a few normal breaths between deep breaths. The spirometer may include an indicator to show your best effort. Use the indicator as a goal to work toward during each repetition. After each set of 10 deep breaths, practice coughing to be sure your lungs are clear. If you have an incision (the cut made at the time of surgery), support your incision when coughing by placing a pillow or rolled up towels firmly against it. Once you are able to get out of bed, walk around indoors and cough well. You may stop using the incentive spirometer when instructed by your caregiver.  RISKS AND COMPLICATIONS Take your time so you do not get dizzy or light-headed. If you are in pain, you may need to take or ask for pain medication before doing incentive spirometry. It is harder to take a deep breath if you are having pain. AFTER USE Rest and breathe slowly and easily. It can be helpful to keep track of a log of your progress. Your caregiver can provide you with a simple table to help with this. If you are using the spirometer at home, follow these instructions: SEEK MEDICAL CARE IF:  You are having difficultly using the  spirometer. You have trouble using the spirometer as often as instructed. Your pain medication is not giving enough relief while using the spirometer. You develop fever of 100.5 F (38.1 C) or higher.                                                                                                    SEEK IMMEDIATE MEDICAL CARE IF:  You cough up bloody sputum that had not been present before. You develop fever of 102 F (38.9 C) or greater. You develop worsening pain at or near the incision site. MAKE SURE YOU:  Understand these instructions. Will watch your condition. Will get help right away if you are not doing well or get worse. Document Released: 08/17/2006 Document Revised: 06/29/2011 Document Reviewed: 10/18/2006 [facility] Patient Information 2014 [place], [place].

## 2024-02-13 NOTE — Progress Notes (Signed)
 COVID Vaccine received:  []  No [x]  Yes Date of any COVID positive Test in last 90 days:  PCP - Murray Amos, MD at [facility] [phone] (Work) [phone] (Fax)  medical clearance scanned to Media and in 02-03-24 Epic note Cardiologist -  none Hematology- Vallathucherry Leverette, MD  at [facility] [phone]   clearance scanned to Media  Chest x-ray - 04-15-2023  2v EKG -  (2017 Epic)   02-03-2024 at [facility] PCP (requested) Stress Test -  ECHO -  Cardiac Cath -  CT Coronary Calcium  score:   Pacemaker / ICD device [x]  No []  Yes   Spinal Cord Stimulator:[x]  No []  Yes       History of Sleep Apnea? []  No [x]  Yes   CPAP used?- []  No [x]  Yes    Patient has: []  NO Hx DM   []  Pre-DM   []  DM1  [x]   DM2 Does the patient monitor blood sugar?   []  N/A   []  No []  Yes  Last A1c was:  6.5 on    11-30-23   on Mounjaro  Does patient have a [employer] or Dexcom? []  No []  Yes   Fasting Blood Sugar Ranges-  Checks Blood Sugar _____ times a day  Blood Thinner / Instructions:  Eliquis   Hold: 48 hrs, last dose on 02-15-24 Aspirin  Instructions:  Dental hx: []  Dentures:  []  N/A      []  Bridge or Partial:                   []  Loose or Damaged teeth:   Comments:   Activity level: Able to walk up 2 flights of stairs without becoming significantly short of breath or having chest pain?  []  No   []    Yes  Patient can perform ADLs without assistance. 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  Patient verbalized understanding and agreement to the Pre-Surgical Instructions that were given to them at this PAT appointment. Patient was also educated of the need to review these PAT instructions again prior to her surgery.I reviewed the appropriate phone numbers to call if they have any and  questions or concerns.

## 2024-02-14 ENCOUNTER — Other Ambulatory Visit: Payer: Self-pay

## 2024-02-14 ENCOUNTER — Encounter (HOSPITAL_COMMUNITY): Payer: Self-pay

## 2024-02-14 ENCOUNTER — Encounter (HOSPITAL_COMMUNITY)
Admission: RE | Admit: 2024-02-14 | Discharge: 2024-02-14 | Disposition: A | Discharge status: Home / Self Care | Source: Ambulatory Visit | Attending: Specialist | Admitting: Specialist

## 2024-02-14 VITALS — BP 115/65 | HR 66 | Temp 98.1°F | Resp 16 | Ht 63.0 in | Wt 171.0 lb

## 2024-02-14 DIAGNOSIS — Z7901 Long term (current) use of anticoagulants: Secondary | ICD-10-CM | POA: Diagnosis not present

## 2024-02-14 DIAGNOSIS — I10 Essential (primary) hypertension: Secondary | ICD-10-CM | POA: Insufficient documentation

## 2024-02-14 DIAGNOSIS — Z01818 Encounter for other preprocedural examination: Secondary | ICD-10-CM

## 2024-02-14 DIAGNOSIS — E119 Type 2 diabetes mellitus without complications: Secondary | ICD-10-CM | POA: Diagnosis not present

## 2024-02-14 DIAGNOSIS — Z01812 Encounter for preprocedural laboratory examination: Secondary | ICD-10-CM | POA: Diagnosis not present

## 2024-02-14 HISTORY — DX: Essential (primary) hypertension: I10

## 2024-02-14 HISTORY — DX: Unspecified osteoarthritis, unspecified site: M19.90

## 2024-02-14 HISTORY — DX: Gastro-esophageal reflux disease without esophagitis: K21.9

## 2024-02-14 HISTORY — DX: Nausea with vomiting, unspecified: R11.2

## 2024-02-14 LAB — CBC
HCT: 45.4 % (ref 36.0–46.0)
Hemoglobin: 14.2 g/dL (ref 12.0–15.0)
MCH: 31.9 pg (ref 26.0–34.0)
MCHC: 31.3 g/dL (ref 30.0–36.0)
MCV: 102 fL — ABNORMAL HIGH (ref 80.0–100.0)
Platelets: 194 K/uL (ref 150–400)
RBC: 4.45 MIL/uL (ref 3.87–5.11)
RDW: 13 % (ref 11.5–15.5)
WBC: 8.7 K/uL (ref 4.0–10.5)
nRBC: 0 % (ref 0.0–0.2)

## 2024-02-14 LAB — BASIC METABOLIC PANEL WITH GFR
Anion gap: 10 (ref 5–15)
BUN: 15 mg/dL (ref 8–23)
CO2: 26 mmol/L (ref 22–32)
Calcium: 9.5 mg/dL (ref 8.9–10.3)
Chloride: 106 mmol/L (ref 98–111)
Creatinine, Ser: 0.81 mg/dL (ref 0.44–1.00)
GFR, Estimated: >60 mL/min (ref >60)
Glucose, Bld: 133 mg/dL — ABNORMAL HIGH (ref 70–99)
Potassium: 4.3 mmol/L (ref 3.5–5.1)
Sodium: 141 mmol/L (ref 135–145)

## 2024-02-14 LAB — HEMOGLOBIN A1C
Hgb A1c MFr Bld: 5.9 % — ABNORMAL HIGH (ref 4.8–5.6)
Mean Plasma Glucose: 122.63 mg/dL

## 2024-02-14 LAB — GLUCOSE, CAPILLARY: Glucose-Capillary: 118 mg/dL — ABNORMAL HIGH (ref 70–99)

## 2024-02-15 ENCOUNTER — Ambulatory Visit: Admitting: Adult Health

## 2024-02-15 ENCOUNTER — Encounter: Payer: Self-pay | Admitting: Adult Health

## 2024-02-15 VITALS — BP 122/79 | HR 70 | Ht 63.0 in | Wt 171.0 lb

## 2024-02-15 DIAGNOSIS — G4733 Obstructive sleep apnea (adult) (pediatric): Secondary | ICD-10-CM | POA: Diagnosis not present

## 2024-02-15 NOTE — Patient Instructions (Addendum)
 Your Plan:  Continue nightly use of CPAP for adequate sleep apnea management  Continue routine follow-up with DME [employer] for any needed supplies or CPAP related concerns     Follow up in 1 year or call earlier if needed     Thank you for coming to see us  at [facility]. I hope we have been able to provide you high quality care today.  You may receive a patient satisfaction survey over the next few weeks. We would appreciate your feedback and comments so that we may continue to improve ourselves and the health of our patients.

## 2024-02-15 NOTE — Progress Notes (Signed)
 [facility] [address] [place]. [place] [address] [phone]       OFFICE FOLLOW UP NOTE  [name] Date of Birth:  March 15, 1956 Medical Record Number:  [id number]     Reason for visit: Initial CPAP follow-up    SUBJECTIVE:   CHIEF COMPLAINT:  Chief Complaint  Patient presents with   Sleep Apnea    RM 3 alone  Pt is well and stable, reports no OSA/CPAP concerns.     Follow-up visit:  Prior visit: 08/04/2023  Brief HPI:   Caroline [name] is a 68 y.o. female with PMH of OSA, history of hypercoagulable state, history of DVT on Eliquis , and stroke in 2017, who is followed for OSA on CPAP.  She was diagnosed with OSA in 2018 which showed mild to moderate OSA.  Subsequent CPAP titration study showed good control of apnea on CPAP setting of 7 and was set up with CPAP therapy.  She returned to see Dr. Buck in 07/2023 and she was eligible for a new machine.  HST 07/2023 showed total AHI 1.8/h and ongoing treatment with CPAP no longer necessary.  She was very hesitant to stop CPAP therapy as she did not sleep well without using CPAP during HST study therefore proceeded with in-lab study.  In-lab study 08/2023 showed overall mild OSA more pronounced during REM sleep with a total AHI of 13.1/h, REM AHI of 48.7/h, supine AHI of 5.7/h and O2 nadir of 83%.  Recommended she continue with CPAP therapy and set up with new machine 11/2023.    Interval history:  Patient is being seen for initial CPAP compliance visit.  Compliance report as below showing excellent usage and optimal residual AHI.  Reports doing well with new machine.  She continues to use a fullface mask and tolerates without difficulty.  DME [employer].  ESS 5/24.  No questions or concerns at this time.       ROS:   14 system review of systems performed and negative with exception of those listed in HPI  PMH:  Past Medical History:  Diagnosis Date   Arthritis    CVA (cerebral vascular  accident) (HCC)    Diabetes mellitus without complication (HCC)    GERD (gastroesophageal reflux disease)    Hypertension    Left carpal tunnel syndrome 07/13/2017   OSA on CPAP    PONV (postoperative nausea and vomiting)    Reflux     PSH:  Past Surgical History:  Procedure Laterality Date   ABDOMINAL HYSTERECTOMY     APPENDECTOMY     EP IMPLANTABLE DEVICE N/A 01/08/2016   Procedure: Loop Recorder Insertion;  Surgeon: Caroline LELON Birmingham, MD;  Location: [facility] INVASIVE CV LAB;  Service: Cardiovascular;  Laterality: N/A;   EYE SURGERY Bilateral    cataract extraction   IR GENERIC HISTORICAL  01/02/2016   IR ANGIO VERTEBRAL SEL VERTEBRAL UNI L MOD SED 01/02/2016 Caroline Nash, MD [facility]   IR GENERIC HISTORICAL  01/02/2016   IR ANGIO INTRA EXTRACRAN SEL COM CAROTID INNOMINATE BILAT MOD SED 01/02/2016 Caroline Nash, MD [facility]   IR GENERIC HISTORICAL  01/02/2016   IR ANGIO VERTEBRAL SEL SUBCLAVIAN INNOMINATE UNI R MOD SED 01/02/2016 Caroline Nash, MD [facility]   left cyst from breast remove     RADIOLOGY WITH ANESTHESIA N/A 01/02/2016   Procedure: RADIOLOGY WITH ANESTHESIA;  Surgeon: Medication Radiologist, MD;  Location: [facility] OR;  Service: Radiology;  Laterality: N/A;   TEE WITHOUT CARDIOVERSION  N/A 01/08/2016   Procedure: TRANSESOPHAGEAL ECHOCARDIOGRAM (TEE);  Surgeon: Caroline GORMAN Shuck, MD;  Location: [facility] ENDOSCOPY;  Service: Cardiovascular;  Laterality: N/A;   TONSILLECTOMY      Social History:  Social History   Socioeconomic History   Marital status: Married    Spouse name: Not on file   Number of children: 2   Years of education: HS   Highest education level: Not on file  Occupational History   Occupation: [employer]  Tobacco Use   Smoking status: Never   Smokeless tobacco: Never  Vaping Use   Vaping status: Never Used  Substance and Sexual Activity   Alcohol use: No   Drug use: No   Sexual activity: Yes  Other Topics Concern   Not on file   Social History Narrative   Lives at home with her husband.   Left-handed   Occasional caffeine use.   Social Drivers of Corporate Investment Banker Strain: Not on file  Food Insecurity: Low Risk  (10/18/2023)   Received from [facility]   Hunger Vital Sign    Within the past 12 months, you worried that your food would run out before you got money to buy more: Never true    Within the past 12 months, the food you bought just didn't last and you didn't have money to get more. : Never true  Transportation Needs: No Transportation Needs (10/18/2023)   Received from [facility]    In the past 12 months, has lack of reliable transportation kept you from medical appointments, meetings, work or from getting things needed for daily living? : No  Physical Activity: Not on file  Stress: Not on file  Social Connections: Not on file  Intimate Partner Violence: Not on file    Family History:  Family History  Problem Relation Age of Onset   Atrial fibrillation Mother    Suicidality Father        1971   Stroke Maternal Grandfather     Medications:   Current Outpatient Medications on File Prior to Visit  Medication Sig Dispense Refill   albuterol (VENTOLIN HFA) 108 (90 Base) MCG/ACT inhaler Inhale 1 puff into the lungs every 6 (six) hours as needed for shortness of breath.     apixaban  (ELIQUIS ) 2.5 MG TABS tablet Take 2.5 mg by mouth 2 (two) times daily.     Ascorbic Acid (VITAMIN C GUMMIES PO) Take 1,000 mg by mouth daily.      atorvastatin  (LIPITOR) 10 MG tablet Take 1 tablet (10 mg total) by mouth daily at 6 PM. (Patient not taking: Reported on 02/10/2024) 30 tablet 3   atorvastatin  (LIPITOR) 40 MG tablet Take 40 mg by mouth at bedtime.     Bacillus Coagulans-Inulin (PROBIOTIC-PREBIOTIC PO) Take 1 capsule by mouth daily.     benzonatate  (TESSALON ) 100 MG capsule Take 1 capsule (100 mg total) by mouth every 8 (eight) hours. (Patient not taking: Reported on 02/10/2024)  21 capsule 0   Blood Glucose Monitoring Suppl (ONETOUCH VERIO FLEX SYSTEM) w/Device KIT daily.     Cholecalciferol (D 5000) 125 MCG (5000 UT) capsule Take 5,000 Units by mouth daily.     cyanocobalamin (VITAMIN B12) 500 MCG tablet Take 500 mcg by mouth every other day.     cycloSPORINE (RESTASIS) 0.05 % ophthalmic emulsion Place 1 drop into both eyes 2 (two) times daily.     docusate sodium (COLACE) 50 MG capsule Take 100 mg by mouth daily.  famotidine (PEPCID) 40 MG tablet Take 40 mg by mouth at bedtime.     fexofenadine (ALLEGRA) 180 MG tablet Take 180 mg by mouth daily.     fluticasone (FLONASE) 50 MCG/ACT nasal spray Place 2 sprays into both nostrils daily.     lidocaine  (LIDODERM ) 5 % Place 1 patch onto the skin daily. Remove & Discard patch within 12 hours or as directed by MD (Patient not taking: Reported on 02/15/2024) 30 patch 0   losartan (COZAAR) 25 MG tablet Take 25 mg by mouth daily.  0   MOUNJARO 12.5 MG/0.5ML Pen Inject 12.5 mg into the skin once a week.     Multiple Vitamin (MULTIVITAMIN WITH MINERALS) TABS tablet Take 2 tablets by mouth daily.     NIFEdipine (PROCARDIA-XL/NIFEDICAL-XL) 30 MG 24 hr tablet Take 30 mg by mouth at bedtime.     omeprazole (PRILOSEC) 40 MG capsule Take 40 mg by mouth daily.     ondansetron  (ZOFRAN -ODT) 4 MG disintegrating tablet Take 1 tablet (4 mg total) by mouth every 8 (eight) hours as needed for nausea or vomiting. (Patient not taking: Reported on 02/10/2024) 20 tablet 0   valACYclovir (VALTREX) 500 MG tablet Take 500 mg by mouth daily.     vitamin E 180 MG (400 UNITS) capsule Take 400 Units by mouth daily.     No current facility-administered medications on file prior to visit.    Allergies:   Allergies  Allergen Reactions   Amoxicillin Rash   Azithromycin Rash   Homatropine Rash   Hydrocodone  Bit-Homatrop Mbr Rash   Penicillin G Rash    Dyspepsia, ingestion   Tape Rash      OBJECTIVE:  Physical Exam  Vitals:   02/15/24  1551  BP: 122/79  Pulse: 70  Weight: 171 lb (77.6 kg)  Height: 5' 3 (1.6 m)   Body mass index is 30.29 kg/m. No results found.  General: well developed, well nourished, very pleasant elderly Caucasian female, seated, in no evident distress Head: head normocephalic and atraumatic.   Neck: supple with no carotid or supraclavicular bruits Cardiovascular: regular rate and rhythm, no murmurs  Neurologic Exam Mental Status: Awake and fully alert. Oriented to place and time. Recent and remote memory intact. Attention span, concentration and fund of knowledge appropriate. Mood and affect appropriate.  Cranial Nerves: Pupils equal, briskly reactive to light. Extraocular movements full without nystagmus. Visual fields full to confrontation. Hearing intact. Facial sensation intact. Face, tongue, palate moves normally and symmetrically.  Motor: Normal bulk and tone. Normal strength in all tested extremity muscles Gait and Station: Arises from chair without difficulty. Stance is normal. Gait demonstrates antalgic gait with favoring of LLE due to knee pain with use of knee brace and cane        ASSESSMENT/PLAN: KAELEIGH WESTENDORF is a 68 y.o. year old female    OSA on CPAP :  Compliance report shows satisfactory usage with optimal residual AHI.   Continue current pressure setting at 7 with EPR 2 Discussed continued nightly usage with ensuring greater than 4 hours nightly for optimal benefit and per insurance purposes.   Continue to follow with DME company for any needed supplies or CPAP related concerns CPAP set up 11/2023     Follow up in 1 year or call earlier if needed   CC:  PCP: Caroline Murray HERO., MD    I personally spent a total of 15 minutes in the care of the patient today including preparing to see the  patient, performing a medically appropriate exam/evaluation, counseling and educating, placing orders, and documenting clinical information in the EHR.   Harlene Bogaert,  AGNP-BC  [facility] [address] [place], [place] [address]  Phone [phone] Fax [phone] Note: This document was prepared with digital dictation and possible smart phrase technology. Any transcriptional errors that result from this process are unintentional.

## 2024-02-18 ENCOUNTER — Other Ambulatory Visit: Payer: Self-pay

## 2024-02-18 ENCOUNTER — Ambulatory Visit (HOSPITAL_COMMUNITY): Admitting: Certified Registered Nurse Anesthetist

## 2024-02-18 ENCOUNTER — Ambulatory Visit (HOSPITAL_COMMUNITY)
Admission: RE | Admit: 2024-02-18 | Discharge: 2024-02-19 | Disposition: A | Discharge status: Home / Self Care | Attending: Specialist | Admitting: Specialist

## 2024-02-18 ENCOUNTER — Encounter (HOSPITAL_COMMUNITY): Payer: Self-pay | Admitting: Specialist

## 2024-02-18 ENCOUNTER — Encounter (HOSPITAL_COMMUNITY)
Admission: RE | Disposition: A | Discharge status: Home / Self Care | Payer: Self-pay | Source: Home / Self Care | Attending: Specialist

## 2024-02-18 DIAGNOSIS — Z86718 Personal history of other venous thrombosis and embolism: Secondary | ICD-10-CM | POA: Insufficient documentation

## 2024-02-18 DIAGNOSIS — G4733 Obstructive sleep apnea (adult) (pediatric): Secondary | ICD-10-CM | POA: Insufficient documentation

## 2024-02-18 DIAGNOSIS — X58XXXA Exposure to other specified factors, initial encounter: Secondary | ICD-10-CM | POA: Insufficient documentation

## 2024-02-18 DIAGNOSIS — M1712 Unilateral primary osteoarthritis, left knee: Secondary | ICD-10-CM | POA: Diagnosis not present

## 2024-02-18 DIAGNOSIS — S83242A Other tear of medial meniscus, current injury, left knee, initial encounter: Secondary | ICD-10-CM

## 2024-02-18 DIAGNOSIS — I69328 Other speech and language deficits following cerebral infarction: Secondary | ICD-10-CM | POA: Insufficient documentation

## 2024-02-18 DIAGNOSIS — M2242 Chondromalacia patellae, left knee: Secondary | ICD-10-CM | POA: Diagnosis not present

## 2024-02-18 DIAGNOSIS — Z7901 Long term (current) use of anticoagulants: Secondary | ICD-10-CM | POA: Diagnosis not present

## 2024-02-18 DIAGNOSIS — G4730 Sleep apnea, unspecified: Secondary | ICD-10-CM | POA: Diagnosis not present

## 2024-02-18 DIAGNOSIS — I10 Essential (primary) hypertension: Secondary | ICD-10-CM | POA: Diagnosis not present

## 2024-02-18 DIAGNOSIS — E119 Type 2 diabetes mellitus without complications: Secondary | ICD-10-CM | POA: Diagnosis not present

## 2024-02-18 DIAGNOSIS — Z79899 Other long term (current) drug therapy: Secondary | ICD-10-CM | POA: Insufficient documentation

## 2024-02-18 DIAGNOSIS — S83282A Other tear of lateral meniscus, current injury, left knee, initial encounter: Secondary | ICD-10-CM | POA: Diagnosis not present

## 2024-02-18 DIAGNOSIS — M8580 Other specified disorders of bone density and structure, unspecified site: Secondary | ICD-10-CM | POA: Diagnosis not present

## 2024-02-18 DIAGNOSIS — Z01818 Encounter for other preprocedural examination: Secondary | ICD-10-CM

## 2024-02-18 HISTORY — PX: KNEE ARTHROSCOPY WITH MEDIAL MENISECTOMY: SHX5651

## 2024-02-18 LAB — GLUCOSE, CAPILLARY
Glucose-Capillary: 184 mg/dL — ABNORMAL HIGH (ref 70–99)
Glucose-Capillary: 114 mg/dL — ABNORMAL HIGH (ref 70–99)
Glucose-Capillary: 165 mg/dL — ABNORMAL HIGH (ref 70–99)
Glucose-Capillary: 335 mg/dL — ABNORMAL HIGH (ref 70–99)

## 2024-02-18 SURGERY — ARTHROSCOPY, KNEE, WITH MEDIAL MENISCECTOMY
Anesthesia: General | Site: Knee | Laterality: Left

## 2024-02-18 MED ORDER — BUPIVACAINE-EPINEPHRINE (PF) 0.5% -1:200000 IJ SOLN
INTRAMUSCULAR | Status: AC
  Filled 2024-02-18: qty 30

## 2024-02-18 MED ORDER — FENTANYL CITRATE (PF) 50 MCG/ML IJ SOSY: 50.0000 ug | PREFILLED_SYRINGE | Freq: Once | INTRAMUSCULAR | Status: DC

## 2024-02-18 MED ORDER — LACTATED RINGERS IV SOLN: INTRAVENOUS | Status: DC

## 2024-02-18 MED ORDER — PHENYLEPHRINE HCL-NACL 20-0.9 MG/250ML-% IV SOLN
INTRAVENOUS | Status: DC | PRN
  Administered 2024-02-18: 25 ug/min via INTRAVENOUS

## 2024-02-18 MED ORDER — METOCLOPRAMIDE HCL 5 MG/ML IJ SOLN: 5.0000 mg | Freq: Three times a day (TID) | INTRAMUSCULAR | Status: DC | PRN

## 2024-02-18 MED ORDER — CEFAZOLIN SODIUM-DEXTROSE 2-4 GM/100ML-% IV SOLN
2.0000 g | INTRAVENOUS | Status: AC
  Administered 2024-02-18: 2 g via INTRAVENOUS

## 2024-02-18 MED ORDER — SODIUM CHLORIDE 0.9 % IR SOLN
Status: DC | PRN
  Administered 2024-02-18: 6000 mL

## 2024-02-18 MED ORDER — BENZONATATE 100 MG PO CAPS: 100.0000 mg | ORAL_CAPSULE | Freq: Three times a day (TID) | ORAL | Status: DC | PRN

## 2024-02-18 MED ORDER — MIDAZOLAM HCL (PF) 2 MG/2ML IJ SOLN: 1.0000 mg | Freq: Once | INTRAMUSCULAR | Status: DC

## 2024-02-18 MED ORDER — VITAMIN C 500 MG PO TABS
1000.0000 mg | ORAL_TABLET | Freq: Every day | ORAL | Status: DC
  Administered 2024-02-18 – 2024-02-19 (×2): 1000 mg via ORAL
  Filled 2024-02-18 (×2): qty 2

## 2024-02-18 MED ORDER — ACETAMINOPHEN 500 MG PO TABS: 1000.0000 mg | ORAL_TABLET | Freq: Once | ORAL | Status: DC

## 2024-02-18 MED ORDER — LOSARTAN POTASSIUM 25 MG PO TABS
25.0000 mg | ORAL_TABLET | Freq: Every day | ORAL | Status: DC
  Administered 2024-02-19: 25 mg via ORAL
  Filled 2024-02-18: qty 1

## 2024-02-18 MED ORDER — FENTANYL CITRATE (PF) 250 MCG/5ML IJ SOLN
INTRAMUSCULAR | Status: DC | PRN
  Administered 2024-02-18 (×2): 50 ug via INTRAVENOUS

## 2024-02-18 MED ORDER — PROPOFOL 10 MG/ML IV BOLUS
INTRAVENOUS | Status: DC | PRN
  Administered 2024-02-18: 120 mg via INTRAVENOUS

## 2024-02-18 MED ORDER — ONDANSETRON HCL 4 MG/2ML IJ SOLN
INTRAMUSCULAR | Status: DC | PRN
  Administered 2024-02-18: 4 mg via INTRAVENOUS

## 2024-02-18 MED ORDER — ONDANSETRON 4 MG PO TBDP: 4.0000 mg | ORAL_TABLET | Freq: Three times a day (TID) | ORAL | Status: DC | PRN

## 2024-02-18 MED ORDER — APIXABAN 2.5 MG PO TABS
2.5000 mg | ORAL_TABLET | Freq: Two times a day (BID) | ORAL | Status: DC
  Administered 2024-02-19: 2.5 mg via ORAL
  Filled 2024-02-18: qty 1

## 2024-02-18 MED ORDER — ALBUTEROL SULFATE (2.5 MG/3ML) 0.083% IN NEBU: 2.5000 mg | INHALATION_SOLUTION | Freq: Four times a day (QID) | RESPIRATORY_TRACT | Status: DC | PRN

## 2024-02-18 MED ORDER — INSULIN ASPART 100 UNIT/ML IJ SOLN
0.0000 [IU] | INTRAMUSCULAR | Status: DC | PRN
  Administered 2024-02-18: 2 [IU] via SUBCUTANEOUS

## 2024-02-18 MED ORDER — FENTANYL CITRATE (PF) 100 MCG/2ML IJ SOLN
INTRAMUSCULAR | Status: AC
  Filled 2024-02-18: qty 2

## 2024-02-18 MED ORDER — LORATADINE 10 MG PO TABS
10.0000 mg | ORAL_TABLET | Freq: Every day | ORAL | Status: DC
  Administered 2024-02-18 – 2024-02-19 (×2): 10 mg via ORAL
  Filled 2024-02-18 (×2): qty 1

## 2024-02-18 MED ORDER — FENTANYL CITRATE (PF) 50 MCG/ML IJ SOSY
PREFILLED_SYRINGE | INTRAMUSCULAR | Status: AC
  Filled 2024-02-18: qty 1

## 2024-02-18 MED ORDER — INSULIN ASPART 100 UNIT/ML IJ SOLN: 0.0000 [IU] | Freq: Three times a day (TID) | INTRAMUSCULAR | Status: DC

## 2024-02-18 MED ORDER — KCL IN DEXTROSE-NACL 20-5-0.9 MEQ/L-%-% IV SOLN
INTRAVENOUS | Status: DC
  Filled 2024-02-18 (×2): qty 1000

## 2024-02-18 MED ORDER — CYCLOSPORINE 0.05 % OP EMUL
1.0000 [drp] | Freq: Two times a day (BID) | OPHTHALMIC | Status: DC
  Administered 2024-02-18 – 2024-02-19 (×2): 1 [drp] via OPHTHALMIC
  Filled 2024-02-18 (×2): qty 30

## 2024-02-18 MED ORDER — INSULIN ASPART 100 UNIT/ML IJ SOLN
INTRAMUSCULAR | Status: AC
Start: 2024-02-18 — End: 2024-02-18
  Filled 2024-02-18: qty 1

## 2024-02-18 MED ORDER — DEXAMETHASONE SOD PHOSPHATE PF 10 MG/ML IJ SOLN
INTRAMUSCULAR | Status: DC | PRN
  Administered 2024-02-18: 40 mg via INTRAVENOUS

## 2024-02-18 MED ORDER — DOCUSATE SODIUM 100 MG PO CAPS
100.0000 mg | ORAL_CAPSULE | Freq: Every day | ORAL | Status: DC
Start: 2024-02-18 — End: 2024-02-19
  Administered 2024-02-18 – 2024-02-19 (×2): 100 mg via ORAL
  Filled 2024-02-18 (×2): qty 1

## 2024-02-18 MED ORDER — LIDOCAINE 2% (20 MG/ML) 5 ML SYRINGE
INTRAMUSCULAR | Status: DC | PRN
  Administered 2024-02-18: 80 mg via INTRAVENOUS

## 2024-02-18 MED ORDER — EPINEPHRINE PF 1 MG/ML IJ SOLN
INTRAMUSCULAR | Status: DC | PRN
  Administered 2024-02-18 (×2): 1 mL

## 2024-02-18 MED ORDER — EPINEPHRINE PF 1 MG/ML IJ SOLN
INTRAMUSCULAR | Status: AC
  Filled 2024-02-18: qty 2

## 2024-02-18 MED ORDER — METHOCARBAMOL 1000 MG/10ML IJ SOLN: 500.0000 mg | Freq: Four times a day (QID) | INTRAMUSCULAR | Status: DC | PRN

## 2024-02-18 MED ORDER — BISACODYL 5 MG PO TBEC: 5.0000 mg | DELAYED_RELEASE_TABLET | Freq: Every day | ORAL | Status: DC | PRN

## 2024-02-18 MED ORDER — ORAL CARE MOUTH RINSE: 15.0000 mL | Freq: Once | OROMUCOSAL | Status: AC

## 2024-02-18 MED ORDER — FAMOTIDINE 20 MG PO TABS
40.0000 mg | ORAL_TABLET | Freq: Every day | ORAL | Status: DC
  Administered 2024-02-18: 40 mg via ORAL
  Filled 2024-02-18: qty 2

## 2024-02-18 MED ORDER — MAGNESIUM CITRATE PO SOLN: 1.0000 | Freq: Once | ORAL | Status: DC | PRN

## 2024-02-18 MED ORDER — DIPHENHYDRAMINE HCL 12.5 MG/5ML PO ELIX: 12.5000 mg | ORAL_SOLUTION | ORAL | Status: DC | PRN

## 2024-02-18 MED ORDER — CHLORHEXIDINE GLUCONATE 0.12 % MT SOLN
15.0000 mL | Freq: Once | OROMUCOSAL | Status: AC
  Administered 2024-02-18: 15 mL via OROMUCOSAL

## 2024-02-18 MED ORDER — POLYETHYLENE GLYCOL 3350 17 G PO PACK
17.0000 g | PACK | Freq: Every day | ORAL | Status: DC
  Administered 2024-02-18 – 2024-02-19 (×2): 17 g via ORAL
  Filled 2024-02-18 (×2): qty 1

## 2024-02-18 MED ORDER — ATORVASTATIN CALCIUM 10 MG PO TABS: 10.0000 mg | ORAL_TABLET | Freq: Every day | ORAL | Status: DC

## 2024-02-18 MED ORDER — ATORVASTATIN CALCIUM 20 MG PO TABS
40.0000 mg | ORAL_TABLET | Freq: Every day | ORAL | Status: DC
  Administered 2024-02-18: 40 mg via ORAL
  Filled 2024-02-18: qty 2

## 2024-02-18 MED ORDER — BUPIVACAINE-EPINEPHRINE 0.5% -1:200000 IJ SOLN
INTRAMUSCULAR | Status: DC | PRN
  Administered 2024-02-18: 30 mL

## 2024-02-18 MED ORDER — VITAMIN D 25 MCG (1000 UNIT) PO TABS
5000.0000 [IU] | ORAL_TABLET | Freq: Every day | ORAL | Status: DC
  Administered 2024-02-19: 5000 [IU] via ORAL
  Filled 2024-02-18: qty 5

## 2024-02-18 MED ORDER — PHENOL 1.4 % MT LIQD: 1.0000 | OROMUCOSAL | Status: DC | PRN

## 2024-02-18 MED ORDER — PANTOPRAZOLE SODIUM 40 MG PO TBEC
80.0000 mg | DELAYED_RELEASE_TABLET | Freq: Every day | ORAL | Status: DC
  Administered 2024-02-18 – 2024-02-19 (×2): 80 mg via ORAL
  Filled 2024-02-18 (×2): qty 2

## 2024-02-18 MED ORDER — MENTHOL 3 MG MT LOZG: 1.0000 | LOZENGE | OROMUCOSAL | Status: DC | PRN

## 2024-02-18 MED ORDER — VALACYCLOVIR HCL 500 MG PO TABS
500.0000 mg | ORAL_TABLET | Freq: Every day | ORAL | Status: DC
Start: 2024-02-18 — End: 2024-02-19
  Administered 2024-02-18 – 2024-02-19 (×2): 500 mg via ORAL
  Filled 2024-02-18 (×2): qty 1

## 2024-02-18 MED ORDER — METOCLOPRAMIDE HCL 5 MG PO TABS: 5.0000 mg | ORAL_TABLET | Freq: Three times a day (TID) | ORAL | Status: DC | PRN

## 2024-02-18 MED ORDER — INSULIN ASPART 100 UNIT/ML IJ SOLN
0.0000 [IU] | Freq: Three times a day (TID) | INTRAMUSCULAR | Status: DC
  Administered 2024-02-19: 3 [IU] via SUBCUTANEOUS

## 2024-02-18 MED ORDER — VITAMIN E 180 MG (400 UNIT) PO CAPS
400.0000 [IU] | ORAL_CAPSULE | Freq: Every day | ORAL | Status: DC
  Administered 2024-02-19: 400 [IU] via ORAL
  Filled 2024-02-18: qty 1

## 2024-02-18 MED ORDER — FENTANYL CITRATE (PF) 50 MCG/ML IJ SOSY
25.0000 ug | PREFILLED_SYRINGE | INTRAMUSCULAR | Status: DC | PRN
  Administered 2024-02-18: 50 ug via INTRAVENOUS

## 2024-02-18 MED ORDER — METHOCARBAMOL 500 MG PO TABS
500.0000 mg | ORAL_TABLET | Freq: Four times a day (QID) | ORAL | Status: DC | PRN
  Administered 2024-02-18: 500 mg via ORAL
  Filled 2024-02-18: qty 1

## 2024-02-18 MED ORDER — OXYCODONE HCL 5 MG/5ML PO SOLN: 5.0000 mg | Freq: Once | ORAL | Status: DC | PRN

## 2024-02-18 MED ORDER — NIFEDIPINE ER OSMOTIC RELEASE 30 MG PO TB24
30.0000 mg | ORAL_TABLET | Freq: Every day | ORAL | Status: DC
  Administered 2024-02-18: 30 mg via ORAL
  Filled 2024-02-18: qty 1

## 2024-02-18 MED ORDER — OXYCODONE HCL 5 MG PO TABS: 5.0000 mg | ORAL_TABLET | Freq: Once | ORAL | Status: DC | PRN

## 2024-02-18 MED ORDER — CEFAZOLIN SODIUM-DEXTROSE 2-4 GM/100ML-% IV SOLN
INTRAVENOUS | Status: AC
  Filled 2024-02-18: qty 100

## 2024-02-18 MED ORDER — MIDAZOLAM HCL (PF) 2 MG/2ML IJ SOLN
INTRAMUSCULAR | Status: DC | PRN
  Administered 2024-02-18: 2 mg via INTRAVENOUS

## 2024-02-18 MED ORDER — OXYCODONE HCL 5 MG PO TABS: 5.0000 mg | ORAL_TABLET | ORAL | Status: DC | PRN

## 2024-02-18 MED ORDER — MIDAZOLAM HCL 2 MG/2ML IJ SOLN
INTRAMUSCULAR | Status: AC
  Filled 2024-02-18: qty 2

## 2024-02-18 MED ORDER — PROPOFOL 10 MG/ML IV BOLUS
INTRAVENOUS | Status: AC
  Filled 2024-02-18: qty 20

## 2024-02-18 MED ORDER — ACETAMINOPHEN 500 MG PO TABS
1000.0000 mg | ORAL_TABLET | Freq: Four times a day (QID) | ORAL | Status: DC
  Administered 2024-02-18 – 2024-02-19 (×3): 1000 mg via ORAL
  Filled 2024-02-18 (×3): qty 2

## 2024-02-18 MED ORDER — ALUM & MAG HYDROXIDE-SIMETH 200-200-20 MG/5ML PO SUSP: 30.0000 mL | ORAL | Status: DC | PRN

## 2024-02-18 MED ORDER — AMISULPRIDE (ANTIEMETIC) 5 MG/2ML IV SOLN
10.0000 mg | Freq: Once | INTRAVENOUS | Status: DC | PRN
Start: 2024-02-18 — End: 2024-02-18

## 2024-02-18 MED ORDER — OXYCODONE HCL 5 MG PO TABS: 5.0000 mg | ORAL_TABLET | ORAL | 0 refills | Status: DC | PRN

## 2024-02-18 MED ORDER — DEXAMETHASONE SODIUM PHOSPHATE 4 MG/ML IJ SOLN
INTRAMUSCULAR | Status: DC | PRN
  Administered 2024-02-18: 5 mg via INTRAVENOUS

## 2024-02-18 MED ORDER — ADULT MULTIVITAMIN W/MINERALS CH
1.0000 | ORAL_TABLET | Freq: Every day | ORAL | Status: DC
  Administered 2024-02-18 – 2024-02-19 (×2): 1 via ORAL
  Filled 2024-02-18 (×2): qty 1

## 2024-02-18 MED ORDER — INSULIN ASPART 100 UNIT/ML IJ SOLN
0.0000 [IU] | Freq: Every day | INTRAMUSCULAR | Status: DC
  Administered 2024-02-18: 4 [IU] via SUBCUTANEOUS

## 2024-02-18 MED ORDER — PROPOFOL 500 MG/50ML IV EMUL
INTRAVENOUS | Status: DC | PRN
  Administered 2024-02-18: 150 ug/kg/min via INTRAVENOUS

## 2024-02-18 MED ORDER — FLUTICASONE PROPIONATE 50 MCG/ACT NA SUSP
2.0000 | Freq: Every day | NASAL | Status: DC
  Administered 2024-02-19: 2 via NASAL
  Filled 2024-02-18: qty 16

## 2024-02-18 MED ORDER — VITAMIN B-12 1000 MCG PO TABS
500.0000 ug | ORAL_TABLET | ORAL | Status: DC
  Administered 2024-02-19: 500 ug via ORAL
  Filled 2024-02-18: qty 1

## 2024-02-18 SURGICAL SUPPLY — 26 items
BAG COUNTER SPONGE SURGICOUNT (BAG) ×1 IMPLANT
BLADE SHAVER TORPEDO 4X13 (MISCELLANEOUS) IMPLANT
BNDG ELASTIC 4INX 5YD STR LF (GAUZE/BANDAGES/DRESSINGS) IMPLANT
BNDG ELASTIC 6INX 5YD STR LF (GAUZE/BANDAGES/DRESSINGS) ×1 IMPLANT
BOOTIES KNEE HIGH SLOAN (MISCELLANEOUS) ×2 IMPLANT
COVER SURGICAL LIGHT HANDLE (MISCELLANEOUS) ×1 IMPLANT
DISSECTOR 3.5MM X 13CM CVD (MISCELLANEOUS) IMPLANT
DURAPREP 26ML APPLICATOR (WOUND CARE) ×1 IMPLANT
DW OUTFLOW CASSETTE/TUBE SET (MISCELLANEOUS) ×1 IMPLANT
GAUZE PAD ABD 8X10 STRL (GAUZE/BANDAGES/DRESSINGS) ×1 IMPLANT
GAUZE SPONGE 4X4 12PLY STRL (GAUZE/BANDAGES/DRESSINGS) ×1 IMPLANT
GAUZE XEROFORM 1X8 LF (GAUZE/BANDAGES/DRESSINGS) IMPLANT
GLOVE BIOGEL PI IND STRL 7.0 (GLOVE) ×1 IMPLANT
GLOVE SURG SS PI 8.0 STRL IVOR (GLOVE) ×1 IMPLANT
GOWN STRL REUS W/ TWL XL LVL3 (GOWN DISPOSABLE) ×2 IMPLANT
KIT TURNOVER KIT A (KITS) ×1 IMPLANT
MANIFOLD NEPTUNE II (INSTRUMENTS) ×1 IMPLANT
PACK ARTHROSCOPY WL (CUSTOM PROCEDURE TRAY) ×1 IMPLANT
PADDING CAST COTTON 6X4 STRL (CAST SUPPLIES) ×1 IMPLANT
PENCIL SMOKE EVACUATOR (MISCELLANEOUS) IMPLANT
SUT ETHILON 4 0 PS 2 18 (SUTURE) ×1 IMPLANT
TOWEL OR 17X26 10 PK STRL BLUE (TOWEL DISPOSABLE) ×1 IMPLANT
TUBING ARTHROSCOPY IRRIG 16FT (MISCELLANEOUS) ×1 IMPLANT
WAND APOLLORF SJ50 AR-9845 (SURGICAL WAND) IMPLANT
WIPE CHG 2% PREP (PERSONAL CARE ITEMS) ×1 IMPLANT
WRAP KNEE MAXI GEL POST OP (GAUZE/BANDAGES/DRESSINGS) ×1 IMPLANT

## 2024-02-18 NOTE — Anesthesia Postprocedure Evaluation (Signed)
 Anesthesia Post Note  Patient: Caroline [name]  Procedure(s) Performed: ARTHROSCOPY, KNEE, WITH MEDIAL MENISCECTOMY (Left: Knee)     Patient location during evaluation: PACU Anesthesia Type: General Level of consciousness: awake and alert Pain management: pain level controlled Vital Signs Assessment: post-procedure vital signs reviewed and stable Respiratory status: spontaneous breathing, nonlabored ventilation, respiratory function stable and patient connected to nasal cannula oxygen Cardiovascular status: blood pressure returned to baseline and stable Postop Assessment: no apparent nausea or vomiting Anesthetic complications: no   No notable events documented.  Last Vitals:  Vitals:   02/18/24 1600 02/18/24 1621  BP: 121/80 113/71  Pulse: 78 84  Resp: 20 18  Temp:  36.6 C  SpO2: 95% 96%    Last Pain:  Vitals:   02/18/24 1600  TempSrc:   PainSc: 0-No pain                 Abrar Koone L Yuya Vanwingerden

## 2024-02-18 NOTE — Progress Notes (Signed)
   02/18/24 2303  BiPAP/CPAP/SIPAP  $ Non-Invasive Home Ventilator  Initial  BiPAP/CPAP/SIPAP Pt Type Adult  BiPAP/CPAP/SIPAP Resmed  Mask Type Nasal mask (own)  FiO2 (%) 21 %  Patient Home Machine No  Patient Home Mask Yes  Patient Home Tubing No (pt brought tubing- will not fit into machine)  Auto Titrate Yes  Minimum cmH2O 5 cmH2O  Maximum cmH2O 20 cmH2O  CPAP/SIPAP surface wiped down Yes  Device Plugged into RED Power Outlet Yes  BiPAP/CPAP /SiPAP Vitals  Bilateral Breath Sounds Clear

## 2024-02-18 NOTE — Anesthesia Procedure Notes (Signed)
 Procedure Name: LMA Insertion Date/Time: 02/18/2024 1:26 PM  Performed by: Cena Epps, CRNAPre-anesthesia Checklist: Patient identified, Emergency Drugs available, Suction available and Patient being monitored Patient Re-evaluated:Patient Re-evaluated prior to induction Oxygen Delivery Method: Circle System Utilized Preoxygenation: Pre-oxygenation with 100% oxygen Induction Type: IV induction Ventilation: Mask ventilation without difficulty LMA: LMA inserted LMA Size: 3.0 Number of attempts: 1 Airway Equipment and Method: Bite block Placement Confirmation: positive ETCO2 Tube secured with: Tape Dental Injury: Teeth and Oropharynx as per pre-operative assessment

## 2024-02-18 NOTE — Plan of Care (Signed)
  Problem: Pain Managment: Goal: General experience of comfort will improve and/or be controlled Outcome: Progressing

## 2024-02-18 NOTE — Brief Op Note (Signed)
 02/18/2024  8:15 AM  PATIENT:  Caroline [name]  68 y.o. female  PRE-OPERATIVE DIAGNOSIS:  Pain, joint, knee, left  POST-OPERATIVE DIAGNOSIS:  * No post-op diagnosis entered *  PROCEDURE:  Procedure(s): ARTHROSCOPY, KNEE, WITH MEDIAL MENISCECTOMY (Left)  SURGEON:  Surgeons and Role:    DEWAINE Duwayne Purchase, MD - Primary  PHYSICIAN ASSISTANT:   ASSISTANTS: Henry   ANESTHESIA:   general  EBL:  15  BLOOD ADMINISTERED:none  DRAINS: none   LOCAL MEDICATIONS USED:  MARCAINE     SPECIMEN:  No Specimen  DISPOSITION OF SPECIMEN:  N/A  COUNTS:  YES  TOURNIQUET:  * No tourniquets in log *  DICTATION: .Other Dictation: Dictation Number [id number]  PLAN OF CARE: Discharge to home after PACU  PATIENT DISPOSITION:  PACU - hemodynamically stable.   Delay start of Pharmacological VTE agent (>24hrs) due to surgical blood loss or risk of bleeding: no

## 2024-02-18 NOTE — Anesthesia Preprocedure Evaluation (Addendum)
 Anesthesia Evaluation  Patient identified by MRN, date of birth, ID band Patient awake    Reviewed: Allergy & Precautions, NPO status , Patient's Chart, lab work & pertinent test results  History of Anesthesia Complications (+) PONV and history of anesthetic complications  Airway Mallampati: III  TM Distance: >3 FB Neck ROM: Full    Dental no notable dental hx. (+) Teeth Intact, Dental Advisory Given   Pulmonary sleep apnea and Continuous Positive Airway Pressure Ventilation    Pulmonary exam normal breath sounds clear to auscultation       Cardiovascular hypertension, Pt. on medications Normal cardiovascular exam Rhythm:Regular Rate:Normal  TEE 2017 - Left ventricle: The cavity size was normal. Wall thickness was    normal. Systolic function was normal. The estimated ejection    fraction was in the range of 60% to 65%. Wall motion was normal;    there were no regional wall motion abnormalities.  - Aortic valve: There was no stenosis.  - Aorta: Normal caliber aorta with minimal plaque.  - Mitral valve: There was no significant regurgitation.  - Left atrium: No evidence of thrombus in the atrial cavity or    appendage.  - Right ventricle: The cavity size was normal. Systolic function    was normal.  - Right atrium: No evidence of thrombus in the atrial cavity or    appendage.  - Atrial septum: There appeared to be a very small PFO, bubble    study done twice to visualize a very small amount of bubble    crossing.     Neuro/Psych CVA (speech difficulty)  negative psych ROS   GI/Hepatic Neg liver ROS,GERD  ,,  Endo/Other  diabetes, Type 2    Renal/GU negative Renal ROS  negative genitourinary   Musculoskeletal  (+) Arthritis ,    Abdominal   Peds  Hematology  (+) Blood dyscrasia (eliquis )   Anesthesia Other Findings   Reproductive/Obstetrics                              Anesthesia  Physical Anesthesia Plan  ASA: 3  Anesthesia Plan: General   Post-op Pain Management: Tylenol  PO (pre-op)*   Induction: Intravenous  PONV Risk Score and Plan: 4 or greater and Ondansetron , Dexamethasone, TIVA and Treatment may vary due to age or medical condition  Airway Management Planned: LMA  Additional Equipment:   Intra-op Plan:   Post-operative Plan: Extubation in OR  Informed Consent: I have reviewed the patients History and Physical, chart, labs and discussed the procedure including the risks, benefits and alternatives for the proposed anesthesia with the patient or authorized representative who has indicated his/her understanding and acceptance.     Dental advisory given  Plan Discussed with: CRNA  Anesthesia Plan Comments:          Anesthesia Quick Evaluation

## 2024-02-18 NOTE — Transfer of Care (Signed)
 Immediate Anesthesia Transfer of Care Note  Patient: Caroline [name]  Procedure(s) Performed: ARTHROSCOPY, KNEE, WITH MEDIAL MENISCECTOMY (Left: Knee)  Patient Location: PACU  Anesthesia Type:General  Level of Consciousness: drowsy  Airway & Oxygen Therapy: Patient Spontanous Breathing and Patient connected to face mask oxygen  Post-op Assessment: Report given to RN and Post -op Vital signs reviewed and stable  Post vital signs: Reviewed and stable  Last Vitals:  Vitals Value Taken Time  BP 114/69 02/18/24 14:30  Temp    Pulse 74 02/18/24 14:30  Resp 19 02/18/24 14:30  SpO2 98 % 02/18/24 14:30    Last Pain:  Vitals:   02/18/24 1035  TempSrc: Oral  PainSc:          Complications: No notable events documented.

## 2024-02-18 NOTE — Op Note (Signed)
 NAME: Caroline [name], ST[place]GHILL MEDICAL RECORD NO: [id number] ACCOUNT NO: [id number] DATE OF BIRTH: 09/02/1955 FACILITY: [facility] LOCATION: [id number] PHYSICIAN: Reyes KYM Billing, MD  Operative Report   DATE OF PROCEDURE: 02/18/2024  PREOPERATIVE DIAGNOSES: 1. Medial meniscus tear of the left knee. 2. DJD.  POSTOPERATIVE DIAGNOSES: 1. Medial and lateral meniscus tear, left knee. 2. 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  PROCEDURES PERFORMED: 1. Left knee arthroscopy. 2. Partial medial and lateral meniscectomy. 3. Chondroplasty of the medial femoral condyle, medial tibial plateau, patella, femoral sulcus, lateral tibial plateau.  ANESTHESIA: General.  ASSISTANT: Almeda Rummer.  HISTORY: A 68 year old with locking, popping, and giving way of the left knee. MRI indicated medial meniscus tear indicated for partial meniscectomy and debridement. Risks and benefits discussed including bleeding, infection, damage to neurovascular  structures, no change in symptoms or worsening symptoms, DVT, PE, anesthetic complications, etc.  DESCRIPTION OF PROCEDURE: The patient was placed in the supine position. After the induction of adequate general anesthesia, the left lower extremity was prepped and draped in the usual sterile fashion. A lateral epicondylar portal was fashioned with a  #11 blade. An ingress cannula was atraumatically placed. Irrigation was utilized to insufflate the joint at 35 mmHg. A superomedial parapatellar portal was fashioned with a #11 blade and an egress cannula atraumatically placed. Under direct  visualization, the medial parapatellar portal was fashioned with a #11 blade after localization with an 18-gauge needle sparing the medial meniscus. Noted was extensive grade III changes of the medial femoral condyle and tibial plateau. There  was a tear  of the posterior horn of the medial meniscus, unstable. I introduced a basket and resected approximately one-third of the posterior 20% of the meniscus to a stable base that was stable with probe palpation. The root was stable. Light chondroplasty was  performed at the tibial plateau and the femoral condyle. The entire weightbearing surface, there was a 1 x 1.5 cm grade IV lesion of the medial femoral condyle. ACL was unremarkable. Suprapatellar pouch revealed grade III changes of the patella and the  femoral sulcus.  Light chondroplasty was performed of both. There was normal patellofemoral tracking and the gutter was unremarkable.  Lateral compartment with the leg held in the figure-of-four position to gain access to a tight lateral compartment held by the assistant. A radial tear of the mid-third of the lateral meniscus was noted that was shaved to a stable base. There were grade  III changes of the tibial plateau and a light chondroplasty performed here. Femoral condyle showed some grade II changes.  I revisited all compartments. No further pathology, amenable to arthroscopic intervention. I therefore removed all instrumentation. Portals were closed with 4-0 nylon simple sutures. 0.25% Marcaine with epinephrine  was infiltrated in the joint. The  wounds were dressed. The patient was awoken without difficulty and transported to the recovery room in satisfactory condition.  The patient tolerated the procedure well with no complications.  ASSISTANT:  Almeda Rummer was used throughout the case, patient positioning, monitoring inflow and outflow, and holding the leg to gain access to a tight lateral compartment in figure-of-four position.  BLOOD LOSS: 15 mL.    MUK D: 02/18/2024 2:10:50 pm T: 02/18/2024 9:34:00 pm  JOB: [id number]/ [id number]

## 2024-02-18 NOTE — Progress Notes (Signed)
 Subjective:  Patient reports pain as appropriately controlled. Denies any new numbness/tingling.   Objective:   VITALS:  Temp:  [97.6 F (36.4 C)-99 F (37.2 C)] 97.7 F (36.5 C) (11/01 0455) Pulse Rate:  [55-84] 64 (11/01 0455) Resp:  [15-20] 15 (11/01 0455) BP: (98-121)/(65-94) 112/66 (11/01 0455) SpO2:  [91 %-99 %] 94 % (11/01 0455) FiO2 (%):  [21 %] 21 % (10/31 2303) Weight:  [77.6 kg] 77.6 kg (10/31 1025)  Neurologically intact Neurovascular intact Sensation & motor grossly intact distally Intact pulses distally Compartment soft   LABS Recent Labs    02/19/24 0508  HGB 13.4  WBC 10.6*  PLT 183   Recent Labs    02/19/24 0508  NA 140  K 4.8  CL 108  CO2 23  BUN 14  CREATININE 0.78  GLUCOSE 186*   No results for input(s): LABPT, INR in the last 72 hours.   Assessment/Plan: 1 Day Post-Op Procedure(s) (LRB): ARTHROSCOPY, KNEE, WITH MEDIAL MENISCECTOMY (Left)  Restart Eliquis  today Advance diet Up with therapy Discharge home today  Caroline [name] 02/19/2024, 9:02 AM

## 2024-02-18 NOTE — Interval H&P Note (Signed)
 History and Physical Interval Note:  02/18/2024 12:30 PM  Caroline [name]  has presented today for surgery, with the diagnosis of Pain, joint, knee, left.  The various methods of treatment have been discussed with the patient and family. After consideration of risks, benefits and other options for treatment, the patient has consented to  Procedure(s): ARTHROSCOPY, KNEE, WITH MEDIAL MENISCECTOMY (Left) as a surgical intervention.  The patient's history has been reviewed, patient examined, no change in status, stable for surgery.  I have reviewed the patient's chart and labs.  Questions were answered to the patient's satisfaction.     Reyes Caroline [name] Billing

## 2024-02-19 ENCOUNTER — Encounter (HOSPITAL_COMMUNITY): Payer: Self-pay | Admitting: Specialist

## 2024-02-19 DIAGNOSIS — S83242A Other tear of medial meniscus, current injury, left knee, initial encounter: Secondary | ICD-10-CM | POA: Diagnosis not present

## 2024-02-19 LAB — CBC
HCT: 41.8 % (ref 36.0–46.0)
Hemoglobin: 13.4 g/dL (ref 12.0–15.0)
MCH: 32.5 pg (ref 26.0–34.0)
MCHC: 32.1 g/dL (ref 30.0–36.0)
MCV: 101.5 fL — ABNORMAL HIGH (ref 80.0–100.0)
Platelets: 183 K/uL (ref 150–400)
RBC: 4.12 MIL/uL (ref 3.87–5.11)
RDW: 12.8 % (ref 11.5–15.5)
WBC: 10.6 K/uL — ABNORMAL HIGH (ref 4.0–10.5)
nRBC: 0 % (ref 0.0–0.2)

## 2024-02-19 LAB — BASIC METABOLIC PANEL WITH GFR
Anion gap: 9 (ref 5–15)
BUN: 14 mg/dL (ref 8–23)
CO2: 23 mmol/L (ref 22–32)
Calcium: 9.6 mg/dL (ref 8.9–10.3)
Chloride: 108 mmol/L (ref 98–111)
Creatinine, Ser: 0.78 mg/dL (ref 0.44–1.00)
GFR, Estimated: >60 mL/min (ref >60)
Glucose, Bld: 186 mg/dL — ABNORMAL HIGH (ref 70–99)
Potassium: 4.8 mmol/L (ref 3.5–5.1)
Sodium: 140 mmol/L (ref 135–145)

## 2024-02-19 LAB — GLUCOSE, CAPILLARY
Glucose-Capillary: 153 mg/dL — ABNORMAL HIGH (ref 70–99)
Glucose-Capillary: 190 mg/dL — ABNORMAL HIGH (ref 70–99)

## 2024-02-19 NOTE — Plan of Care (Signed)

## 2024-02-19 NOTE — Progress Notes (Signed)
 Discharge instructions discussed with patient and family, verbalized agreement and understanding

## 2024-02-19 NOTE — Evaluation (Signed)
 Physical Therapy Evaluation Patient Details Name: Caroline [name] MRN: [id number] DOB: [birth year] Today's Date: 02/19/2024  History of Present Illness  Pt is a 68 yo female presenting s/p L knee arthroscopy, partial medial and lateral meniscectomy, chrondroplasty with general anthesthesia.  PMH: Hx of CVA, DM, GERD, HTN, OSA on CPAP, hx of DVT  Clinical Impression  Caroline [name] is a 68 y.o. female POD1s/p the procedure above. Patient reports independence with mobility at baseline. Patient is now limited by functional impairments (see PT problem list below) requiring CGA for transfers. Patient was able to ambulate 80 feet with RW and CGA level of assist. Patient instructed in exercises to facilitate ROM and circulation to manage edema. Pt would benefit from small sized shower chair with back prior to discharge. Pt is discharged from acute care physical therapy, thank you for this referral. Should needs change, please reconsult.       If plan is discharge home, recommend the following: A little help with walking and/or transfers;A little help with bathing/dressing/bathroom;Assistance with cooking/housework;Assist for transportation;Help with stairs or ramp for entrance   Can travel by private vehicle        Equipment Recommendations Other (comment) (Small shower chair with backrest)  Recommendations for Other Services       Functional Status Assessment Patient has had a recent decline in their functional status and demonstrates the ability to make significant improvements in function in a reasonable and predictable amount of time.     Precautions / Restrictions Precautions Precautions: Fall Recall of Precautions/Restrictions: Intact Precaution/Restrictions Comments: Use of RW first 48 hours Restrictions Weight Bearing Restrictions Per Provider Order: Yes LLE Weight Bearing Per Provider Order: Weight bearing as tolerated      Mobility  Bed Mobility Overal bed mobility:  Independent             General bed mobility comments: HOB elevated, increased time    Transfers Overall transfer level: Modified independent Equipment used: Rolling walker (2 wheels)               General transfer comment: Pt completed STS to RW from EOB mod IND, no physical assist required    Ambulation/Gait Ambulation/Gait assistance: Contact guard assist Gait Distance (Feet): 80 Feet Assistive device: Rolling walker (2 wheels) Gait Pattern/deviations: Step-to pattern, Decreased step length - left, Decreased stance time - left, Decreased weight shift to left Gait velocity: decreased     General Gait Details: Pt instructed in gait training with RW and CGA for 75ft with 1-180deg turna nd 2-90deg turns, cues for step to pattern wtih use of BUE on RW to assist with offloading of LLE, good followthrough.  Stairs            Wheelchair Mobility     Tilt Bed    Modified Rankin (Stroke Patients Only)       Balance Overall balance assessment: Needs assistance Sitting-balance support: Feet supported, No upper extremity supported Sitting balance-Leahy Scale: Good     Standing balance support: Reliant on assistive device for balance, During functional activity, Bilateral upper extremity supported Standing balance-Leahy Scale: Poor                               Pertinent Vitals/Pain Pain Assessment Pain Assessment: 0-10 Pain Score: 2  Pain Location: L knee Pain Descriptors / Indicators: Discomfort, Operative site guarding Pain Intervention(s): Limited activity within patient's tolerance, Monitored during session, Repositioned  Home Living Family/patient expects to be discharged to:: Private residence Living Arrangements: Spouse/significant other Available Help at Discharge: Family;Available 24 hours/day Type of Home: House Home Access: Level entry       Home Layout: One level Home Equipment: Agricultural Consultant (2 wheels)      Prior  Function Prior Level of Function : Independent/Modified Independent             Mobility Comments: IND ADLs Comments: IND     Extremity/Trunk Assessment   Upper Extremity Assessment Upper Extremity Assessment: Overall WFL for tasks assessed    Lower Extremity Assessment Lower Extremity Assessment: RLE deficits/detail;LLE deficits/detail RLE Deficits / Details: MMT grossly 4/5 at hip knee ankl;e RLE Sensation: WNL LLE Deficits / Details: MMT grossly 3+/5 via functional mobility, did not perform MMT due to surgery and pain LLE Sensation: WNL    Cervical / Trunk Assessment Cervical / Trunk Assessment: Normal  Communication   Communication Communication: No apparent difficulties    Cognition Arousal: Alert Behavior During Therapy: WFL for tasks assessed/performed   PT - Cognitive impairments: No apparent impairments                         Following commands: Intact       Cueing Cueing Techniques: Verbal cues     General Comments General comments (skin integrity, edema, etc.): Husband and daughter present and assisted with history    Exercises Total Joint Exercises Ankle Circles/Pumps: AROM, Both, 20 reps Quad Sets: AROM, Both, 5 reps Short Arc Quad: AROM, Left, 5 reps Heel Slides: AROM, Left, 5 reps Hip ABduction/ADduction: AROM, Both, 10 reps Straight Leg Raises: AROM, Left, 5 reps Goniometric ROM: -10 - 70deg in sitting by gross visual approximation   Assessment/Plan    PT Assessment All further PT needs can be met in the next venue of care  PT Problem List Decreased strength;Decreased range of motion;Decreased activity tolerance;Decreased balance;Decreased mobility;Decreased knowledge of use of DME       PT Treatment Interventions      PT Goals (Current goals can be found in the Care Plan section)  Acute Rehab PT Goals Patient Stated Goal: to go home PT Goal Formulation: With patient Time For Goal Achievement: 03/04/24 Potential to  Achieve Goals: Good    Frequency       Co-evaluation               AM-PAC PT 6 Clicks Mobility  Outcome Measure Help needed turning from your back to your side while in a flat bed without using bedrails?: None Help needed moving from lying on your back to sitting on the side of a flat bed without using bedrails?: None Help needed moving to and from a bed to a chair (including a wheelchair)?: None Help needed standing up from a chair using your arms (e.g., wheelchair or bedside chair)?: A Little Help needed to walk in hospital room?: A Little Help needed climbing 3-5 steps with a railing? : A Lot 6 Click Score: 20    End of Session Equipment Utilized During Treatment: Gait belt Activity Tolerance: Patient tolerated treatment well;No increased pain Patient left: in chair;with call bell/phone within reach;with family/visitor present Nurse Communication: Mobility status PT Visit Diagnosis: Difficulty in walking, not elsewhere classified (R26.2)    Time: 1010-1038 PT Time Calculation (min) (ACUTE ONLY): 28 min   Charges:   PT Evaluation $PT Eval Low Complexity: 1 Low PT Treatments $Gait Training: 8-22 mins PT General  Charges $$ ACUTE PT VISIT: 1 Visit         Elsie Grieves, PT, DPT [facility] Department Office: [phone]  Elsie Grieves 02/19/2024, 10:41 AM

## 2024-03-08 ENCOUNTER — Other Ambulatory Visit (HOSPITAL_COMMUNITY): Payer: Self-pay | Admitting: Specialist

## 2024-03-08 ENCOUNTER — Ambulatory Visit (HOSPITAL_COMMUNITY)
Admission: RE | Admit: 2024-03-08 | Discharge: 2024-03-08 | Disposition: A | Discharge status: Home / Self Care | Source: Ambulatory Visit | Attending: Surgery | Admitting: Surgery

## 2024-03-08 DIAGNOSIS — M7989 Other specified soft tissue disorders: Secondary | ICD-10-CM | POA: Diagnosis present

## 2024-03-08 DIAGNOSIS — M79605 Pain in left leg: Secondary | ICD-10-CM | POA: Insufficient documentation

## 2024-03-29 ENCOUNTER — Encounter: Payer: Self-pay | Admitting: Neurology

## 2024-03-29 ENCOUNTER — Ambulatory Visit: Admitting: Neurology

## 2024-03-29 VITALS — BP 119/76 | HR 89 | Ht 63.0 in | Wt 178.0 lb

## 2024-03-29 DIAGNOSIS — G3184 Mild cognitive impairment, so stated: Secondary | ICD-10-CM

## 2024-03-29 DIAGNOSIS — I69328 Other speech and language deficits following cerebral infarction: Secondary | ICD-10-CM | POA: Diagnosis not present

## 2024-03-29 DIAGNOSIS — Z8673 Personal history of transient ischemic attack (TIA), and cerebral infarction without residual deficits: Secondary | ICD-10-CM
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
## 2024-03-29 NOTE — Patient Instructions (Signed)
 I had a long discussion with the patient and her husband regarding her remote history of left MCA branch infarct and 2017 with mild residual speech difficulties as well as recent episode of brief altered awareness and November 2024 raising concern for complex partial seizure.  We discussed the results of her EEG, lab work and MR angiograms and answered questions.  Continue Eliquis  for stroke prevention given her history of hypercoagulability and maintain aggressive risk factor modification with strict control of hypertension with blood pressure goal below 130/90, lipids with LDL cholesterol goal below 70 mg percent and diabetes with hemoglobin A1c goal below 6.5%.  We also discussed memory compensation strategies and I encouraged her to increase participation in cognitively challenging activities like solving crossword puzzles, playing bridge and sudoku.  We also discussed memory compensation strategies she was also counseled to be compliant with using her CPAP every night and recommend follow-up with sleep clinic with Dr. Buck management of her sleep apnea.  Return for follow-up to see me me in 1 year or call earlier if necessary.  Memory Compensation Strategies  Use WARM strategy.  W= write it down  A= associate it  R= repeat it  M= make a mental note  2.   You can keep a Glass Blower/designer.  Use a 3-ring notebook with sections for the following: calendar, important names and phone numbers,  medications, doctors' names/phone numbers, lists/reminders, and a section to journal what you did  each day.   3.    Use a calendar to write appointments down.  4.    Write yourself a schedule for the day.  This can be placed on the calendar or in a separate section of the Memory Notebook.  Keeping a  regular schedule can help memory.  5.    Use medication organizer with sections for each day or morning/evening pills.  You may need help loading it  6.    Keep a basket, or pegboard by the door.  Place  items that you need to take out with you in the basket or on the pegboard.  You may also want to  include a message board for reminders.  7.    Use sticky notes.  Place sticky notes with reminders in a place where the task is performed.  For example:  turn off the  stove placed by the stove, lock the door placed on the door at eye level,  take your medications on  the bathroom mirror or by the place where you normally take your medications.  8.    Use alarms/timers.  Use while cooking to remind yourself to check on food or as a reminder to take your medicine, or as a  reminder to make a call, or as a reminder to perform another task, etc.

## 2024-03-30 ENCOUNTER — Ambulatory Visit: Attending: Vascular Surgery | Admitting: Vascular Surgery

## 2024-03-30 ENCOUNTER — Encounter: Payer: Self-pay | Admitting: Vascular Surgery

## 2024-03-30 VITALS — BP 117/74 | HR 70 | Temp 98.1°F | Resp 18 | Ht 63.0 in | Wt 176.5 lb

## 2024-03-30 DIAGNOSIS — Z86718 Personal history of other venous thrombosis and embolism: Secondary | ICD-10-CM | POA: Diagnosis not present

## 2024-03-30 NOTE — Progress Notes (Signed)
 Office Note     CC: Need for IV filter placement Requesting Provider:  Delilah Murray HERO., MD  HPI: Caroline [name] is a 68 y.o. (09/05/1955) female presenting at the request of .Delilah Murray HERO., MD for evaluation of IVC filter placement prior to TKR with Dr. Baird.  On exam, Caroline [name] was doing well, accompanied by her husband.  A native of [place], she has lived in the community her entire life.    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  Since that time, she has been on Eliquis .  She has met with hematology oncology and underwent HERD002 evaluation demonstrating that she is high risk of forming another clot.  I have kept her on Eliquis  since that time.  Lower extremity study since 2019 demonstrate thrombus resolution.  Caroline [name] notes ongoing left knee pain.  She was wearing a brace in the office today. No history of previous IVC filter placement.    Past Medical History:  Diagnosis Date   Arthritis    CVA (cerebral vascular accident) (HCC)    Diabetes mellitus without complication (HCC)    GERD (gastroesophageal reflux disease)    Hypertension    Left carpal tunnel syndrome 07/13/2017   OSA on CPAP    PONV (postoperative nausea and vomiting)    Reflux     Past Surgical History:  Procedure Laterality Date   ABDOMINAL HYSTERECTOMY     APPENDECTOMY     EP IMPLANTABLE DEVICE N/A 01/08/2016   Procedure: Loop Recorder Insertion;  Surgeon: Danelle LELON Birmingham, MD;  Location: [facility];  Service: Cardiovascular;  Laterality: N/A;   EYE SURGERY Bilateral    cataract extraction   IR GENERIC HISTORICAL  01/02/2016   IR ANGIO VERTEBRAL SEL VERTEBRAL UNI L MOD SED 01/02/2016 Thyra Nash, MD [facility]   IR GENERIC HISTORICAL  01/02/2016   IR ANGIO INTRA EXTRACRAN SEL COM CAROTID INNOMINATE BILAT MOD SED 01/02/2016 Thyra Nash, MD [facility]   IR GENERIC HISTORICAL  01/02/2016   IR ANGIO VERTEBRAL SEL SUBCLAVIAN INNOMINATE UNI R MOD  SED 01/02/2016 Thyra Nash, MD [facility]   KNEE ARTHROSCOPY WITH MEDIAL MENISECTOMY Left 02/18/2024   Procedure: ARTHROSCOPY, KNEE, WITH MEDIAL MENISCECTOMY;  Surgeon: Duwayne Purchase, MD;  Location: [facility];  Service: Orthopedics;  Laterality: Left;   left cyst from breast remove     RADIOLOGY WITH ANESTHESIA N/A 01/02/2016   Procedure: RADIOLOGY WITH ANESTHESIA;  Surgeon: Medication Radiologist, MD;  Location: [facility];  Service: Radiology;  Laterality: N/A;   TEE WITHOUT CARDIOVERSION N/A 01/08/2016   Procedure: TRANSESOPHAGEAL ECHOCARDIOGRAM (TEE);  Surgeon: Ezra GORMAN Shuck, MD;  Location: [facility];  Service: Cardiovascular;  Laterality: N/A;   TONSILLECTOMY      Social History   Socioeconomic History   Marital status: Married    Spouse name: Not on file   Number of children: 2   Years of education: HS   Highest education level: Not on file  Occupational History   Occupation: [employer]  Tobacco Use   Smoking status: Never   Smokeless tobacco: Never  Vaping Use   Vaping status: Never Used  Substance and Sexual Activity   Alcohol use: No   Drug use: No   Sexual activity: Yes  Other Topics Concern   Not on file  Social History Narrative   Lives at home with her husband.   Left-handed   Occasional caffeine use.   Social Drivers of  Health   Tobacco Use: Low Risk (03/29/2024)   Patient History    Smoking Tobacco Use: Never    Smokeless Tobacco Use: Never    Passive Exposure: Not on file  Financial Resource Strain: Not on file  Food Insecurity: No Food Insecurity (02/18/2024)   Epic    Worried About Programme Researcher, Broadcasting/film/video in the Last Year: Never true    Ran Out of Food in the Last Year: Never true  Transportation Needs: No Transportation Needs (02/18/2024)   Epic    Lack of Transportation (Medical): No    Lack of Transportation (Non-Medical): No  Physical Activity: Not on file  Stress: Not on file  Social Connections: Socially Integrated (02/18/2024)    Social Connection and Isolation Panel    Frequency of Communication with Friends and Family: Three times a week    Frequency of Social Gatherings with Friends and Family: More than three times a week    Attends Religious Services: More than 4 times per year    Active Member of [employer] or Organizations: Yes    Attends Engineer, Structural: More than 4 times per year    Marital Status: Married  Catering Manager Violence: Not At Risk (02/18/2024)   Epic    Fear of Current or Ex-Partner: No    Emotionally Abused: No    Physically Abused: No    Sexually Abused: No  Depression (PHQ2-9): Not on file  Alcohol Screen: Not on file  Housing: Low Risk (02/18/2024)   Epic    Unable to Pay for Housing in the Last Year: No    Number of Times Moved in the Last Year: 0    Homeless in the Last Year: No  Utilities: Not At Risk (02/18/2024)   Epic    Threatened with loss of utilities: No  Health Literacy: Not on file   Family History  Problem Relation Age of Onset   Atrial fibrillation Mother    Suicidality Father        54   Stroke Maternal Grandfather     Current Outpatient Medications  Medication Sig Dispense Refill   albuterol  (VENTOLIN  HFA) 108 (90 Base) MCG/ACT inhaler Inhale 1 puff into the lungs every 6 (six) hours as needed for shortness of breath. (Patient not taking: Reported on 03/29/2024)     apixaban  (ELIQUIS ) 2.5 MG TABS tablet Take 2.5 mg by mouth 2 (two) times daily.     Ascorbic Acid  (VITAMIN C  GUMMIES PO) Take 1,000 mg by mouth daily.      atorvastatin  (LIPITOR) 10 MG tablet Take 1 tablet (10 mg total) by mouth daily at 6 PM. (Patient not taking: Reported on 03/29/2024) 30 tablet 3   atorvastatin  (LIPITOR) 40 MG tablet Take 40 mg by mouth at bedtime.     Bacillus Coagulans-Inulin (PROBIOTIC-PREBIOTIC PO) Take 1 capsule by mouth daily.     benzonatate  (TESSALON ) 100 MG capsule Take 1 capsule (100 mg total) by mouth every 8 (eight) hours. (Patient not taking: Reported  on 03/29/2024) 21 capsule 0   Blood Glucose Monitoring Suppl (ONETOUCH VERIO FLEX SYSTEM) w/Device KIT daily.     Cholecalciferol  (D 5000) 125 MCG (5000 UT) capsule Take 5,000 Units by mouth daily.     cyanocobalamin  (VITAMIN B12) 500 MCG tablet Take 500 mcg by mouth every other day. (Patient not taking: Reported on 03/29/2024)     cycloSPORINE  (RESTASIS ) 0.05 % ophthalmic emulsion Place 1 drop into both eyes 2 (two) times daily.  docusate sodium  (COLACE) 50 MG capsule Take 100 mg by mouth daily.     famotidine  (PEPCID ) 40 MG tablet Take 40 mg by mouth at bedtime.     fexofenadine (ALLEGRA) 180 MG tablet Take 180 mg by mouth daily.     fluticasone  (FLONASE ) 50 MCG/ACT nasal spray Place 2 sprays into both nostrils daily.     lidocaine  (LIDODERM ) 5 % Place 1 patch onto the skin daily. Remove & Discard patch within 12 hours or as directed by MD (Patient not taking: Reported on 03/29/2024) 30 patch 0   losartan  (COZAAR ) 25 MG tablet Take 25 mg by mouth daily.  0   MOUNJARO 12.5 MG/0.5ML Pen Inject 12.5 mg into the skin once a week.     Multiple Vitamin (MULTIVITAMIN WITH MINERALS) TABS tablet Take 2 tablets by mouth daily.     NIFEdipine  (PROCARDIA -XL/NIFEDICAL-XL) 30 MG 24 hr tablet Take 30 mg by mouth at bedtime.     omeprazole (PRILOSEC) 40 MG capsule Take 40 mg by mouth daily.     ondansetron  (ZOFRAN -ODT) 4 MG disintegrating tablet Take 1 tablet (4 mg total) by mouth every 8 (eight) hours as needed for nausea or vomiting. (Patient not taking: Reported on 03/29/2024) 20 tablet 0   oxyCODONE  (OXY IR/ROXICODONE ) 5 MG immediate release tablet Take 1 tablet (5 mg total) by mouth every 4 (four) hours as needed for severe pain (pain score 7-10). (Patient not taking: Reported on 03/29/2024) 40 tablet 0   traMADol (ULTRAM) 50 MG tablet Take 50 mg by mouth.     valACYclovir  (VALTREX ) 500 MG tablet Take 500 mg by mouth daily.     vitamin E  180 MG (400 UNITS) capsule Take 400 Units by mouth daily.     No  current facility-administered medications for this visit.    Allergies[1]   REVIEW OF SYSTEMS:  [X]  denotes positive finding, [ ]  denotes negative finding Cardiac  Comments:  Chest pain or chest pressure:    Shortness of breath upon exertion:    Short of breath when lying flat:    Irregular heart rhythm:        Vascular    Pain in calf, thigh, or hip brought on by ambulation:    Pain in feet at night that wakes you up from your sleep:     Blood clot in your veins:    Leg swelling:         Pulmonary    Oxygen at home:    Productive cough:     Wheezing:         Neurologic    Sudden weakness in arms or legs:     Sudden numbness in arms or legs:     Sudden onset of difficulty speaking or slurred speech:    Temporary loss of vision in one eye:     Problems with dizziness:         Gastrointestinal    Blood in stool:     Vomited blood:         Genitourinary    Burning when urinating:     Blood in urine:        Psychiatric    Major depression:         Hematologic    Bleeding problems:    Problems with blood clotting too easily:        Skin    Rashes or ulcers:        Constitutional    Fever or chills:  PHYSICAL EXAMINATION:  There were no vitals filed for this visit.  General:  WDWN in NAD; vital signs documented above Gait: Not observed HENT: WNL, normocephalic Pulmonary: normal non-labored breathing , without wheezing Cardiac: regular HR Abdomen: soft, NT, no masses Skin: without rashes Vascular Exam/Pulses:  Right Left  Radial 2+ (normal) 2+ (normal)  Ulnar    Femoral    Popliteal    DP    PT     Extremities: without ischemic changes, without Gangrene , without cellulitis; with open wounds;  Musculoskeletal: no muscle wasting or atrophy  Neurologic: A&O X 3;  No focal weakness or paresthesias are detected Psychiatric:  The pt has Normal affect.   Non-Invasive Vascular Imaging:   None    ASSESSMENT/PLAN: Caroline [name] is a 68 y.o.  female presenting with history of unprovoked DVT.  She is at high risk per hematology oncology for repeat thrombus formation, and therefore continues on anticoagulation.  I had a long conversation with Caroline [name] regarding the above.  I think it is reasonable to place an IVC filter to decrease her risk of pulmonary embolus status post TKR.  Plan will be to place this a few weeks prior to surgery, with plans to remove the filter in the following 3 months.  She is aware that the longer the filter stays in place, the harder it is to retrieve.  IVC filter placement will be pending knee surgery scheduling.  I will not place unless the surgery is scheduled as I have had issues in the past where surgery was canceled leaving the patient with a filter that needed to be removed.  At the time of filter placement, I will evaluate the IVC to ensure that it is amenable to filter placement.  I have reviewed imaging as far back as 1999 to see if the patient had a CT of her abdomen.  The only CT I could find was from 1999, and only the read was available    Caroline FORBES Rim, MD [facility] [phone]     [1]  Allergies Allergen Reactions   Amoxicillin Rash   Azithromycin Rash   Homatropine Rash   Hydrocodone  Bit-Homatrop Mbr Rash   Penicillin G Rash    Dyspepsia, ingestion   Tape Rash

## 2024-04-11 ENCOUNTER — Ambulatory Visit: Admitting: Neurology

## 2024-04-18 ENCOUNTER — Other Ambulatory Visit: Payer: Self-pay

## 2024-04-18 DIAGNOSIS — Z86718 Personal history of other venous thrombosis and embolism: Secondary | ICD-10-CM

## 2024-04-24 ENCOUNTER — Ambulatory Visit: Payer: Self-pay | Admitting: Orthopedic Surgery

## 2024-05-03 ENCOUNTER — Encounter (HOSPITAL_COMMUNITY): Payer: Self-pay | Admitting: Vascular Surgery

## 2024-05-03 ENCOUNTER — Encounter (HOSPITAL_COMMUNITY)
Admission: RE | Disposition: A | Discharge status: Home / Self Care | Payer: Self-pay | Source: Home / Self Care | Attending: Vascular Surgery

## 2024-05-03 ENCOUNTER — Ambulatory Visit (HOSPITAL_COMMUNITY)
Admission: RE | Admit: 2024-05-03 | Discharge: 2024-05-03 | Disposition: A | Discharge status: Home / Self Care | Attending: Vascular Surgery | Admitting: Vascular Surgery

## 2024-05-03 ENCOUNTER — Other Ambulatory Visit: Payer: Self-pay

## 2024-05-03 DIAGNOSIS — Z86718 Personal history of other venous thrombosis and embolism: Secondary | ICD-10-CM | POA: Insufficient documentation

## 2024-05-03 DIAGNOSIS — I82401 Acute embolism and thrombosis of unspecified deep veins of right lower extremity: Secondary | ICD-10-CM | POA: Diagnosis not present

## 2024-05-03 DIAGNOSIS — M25562 Pain in left knee: Secondary | ICD-10-CM | POA: Insufficient documentation

## 2024-05-03 DIAGNOSIS — Z7901 Long term (current) use of anticoagulants: Secondary | ICD-10-CM | POA: Insufficient documentation

## 2024-05-03 HISTORY — PX: IVC FILTER INSERTION: CATH118245

## 2024-05-03 LAB — POCT I-STAT, CHEM 8
BUN: 17 mg/dL (ref 8–23)
Calcium, Ion: 1.17 mmol/L (ref 1.15–1.40)
Chloride: 104 mmol/L (ref 98–111)
Creatinine, Ser: 0.7 mg/dL (ref 0.44–1.00)
Glucose, Bld: 142 mg/dL — ABNORMAL HIGH (ref 70–99)
HCT: 44 % (ref 36.0–46.0)
Hemoglobin: 15 g/dL (ref 12.0–15.0)
Potassium: 4.3 mmol/L (ref 3.5–5.1)
Sodium: 141 mmol/L (ref 135–145)
TCO2: 25 mmol/L (ref 22–32)

## 2024-05-03 LAB — GLUCOSE, CAPILLARY: Glucose-Capillary: 116 mg/dL — ABNORMAL HIGH (ref 70–99)

## 2024-05-03 MED ORDER — LIDOCAINE HCL (PF) 1 % IJ SOLN
INTRAMUSCULAR | Status: AC
  Filled 2024-05-03: qty 30

## 2024-05-03 MED ORDER — LIDOCAINE HCL (PF) 1 % IJ SOLN
INTRAMUSCULAR | Status: DC | PRN
  Administered 2024-05-03: 10 mL

## 2024-05-03 MED ORDER — MIDAZOLAM HCL (PF) 2 MG/2ML IJ SOLN
INTRAMUSCULAR | Status: DC | PRN
  Administered 2024-05-03: 1 mg via INTRAVENOUS

## 2024-05-03 MED ORDER — HEPARIN (PORCINE) IN NACL 2000-0.9 UNIT/L-% IV SOLN
INTRAVENOUS | Status: DC | PRN
  Administered 2024-05-03: 1000 mL

## 2024-05-03 MED ORDER — IODIXANOL 320 MG/ML IV SOLN
INTRAVENOUS | Status: DC | PRN
  Administered 2024-05-03: 25 mL

## 2024-05-03 MED ORDER — MIDAZOLAM HCL 2 MG/2ML IJ SOLN
INTRAMUSCULAR | Status: AC
  Filled 2024-05-03: qty 2

## 2024-05-03 MED ORDER — FENTANYL CITRATE (PF) 100 MCG/2ML IJ SOLN
INTRAMUSCULAR | Status: AC
  Filled 2024-05-03: qty 2

## 2024-05-03 MED ORDER — SODIUM CHLORIDE 0.9 % IV SOLN: INTRAVENOUS | Status: DC

## 2024-05-03 MED ORDER — FENTANYL CITRATE (PF) 100 MCG/2ML IJ SOLN
INTRAMUSCULAR | Status: DC | PRN
  Administered 2024-05-03: 50 ug via INTRAVENOUS

## 2024-05-03 NOTE — Progress Notes (Signed)
 Patient discharged to home with spouse via wc.

## 2024-05-03 NOTE — Op Note (Signed)
" ° ° °  NAME: Caroline [name]    MRN: [id number] DOB: Mar 26, 1956    DATE OF OPERATION: 05/03/2024  PREOP DIAGNOSIS:    Right lower extremity DVT with high risk of developing another blood clot needing pause and anticoagulation for surgery  POSTOP DIAGNOSIS:    Same  PROCEDURE:    Ultrasound-guided micropuncture access of the right common femoral vein and antegrade fashion Cavogram Cook Celect inferior vena cava filter placement  SURGEON: Fonda FORBES Rim  ASSIST: None  ANESTHESIA: Moderate-fentanyl  and Versed   23 minutes sedation, 25 mL contrast  INDICATIONS:    [name] is a 69 y.o. 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  Patient is scheduled for knee replacement.  She presents today for IVC filter placement as she will be off of anticoagulation for her knee replacement.  FINDINGS:   Patent bilateral renal veins No thrombus in the inferior vena cava  TECHNIQUE:   Patient was brought to the operating room and laid in the supine position.  General anesthesia was induced and the patient was prepped and draped in standard fashion.  The case began with ultrasound insonation of the right common femoral artery.  This was accessed using an ultrasound-guided micropuncture needle.  A wire was run into the superior vena cava.  Cavogram followed demonstrating no thrombus.  Caval was sufficient size for IVC filter placement.  The filter was brought onto the field, positioned in the patient, and deployed below the level of the renal veins.  There was 10 degrees of tilt.  I tried to remove it from the wall, however was unsuccessful with the balloon.  The hook is facing the center of the IVC, therefore it should still be retrievable.  Wires and sheath were removed.  Patient was taken the PACU in stable condition.   Fonda FORBES Rim, MD [facility] DATE OF DICTATION:   05/03/2024  "

## 2024-05-03 NOTE — H&P (Signed)
 " Office Note   Patient seen and examined in preop holding.  No complaints. No changes to medication history or physical exam since last seen in clinic. After discussing the risks and benefits of IVC filter placement, Caroline [name] elected to proceed.   Fonda FORBES Rim MD   CC: Need for IV filter placement Requesting Provider:  No ref. provider found  HPI: Caroline [name] is a 69 y.o. ([birth year]) female presenting at the request of .Delilah Murray HERO., MD for evaluation of IVC filter placement prior to TKR with Dr. Baird.  On exam, Caroline [name] was doing well, accompanied by her husband.  A native of [place], she has lived in the community her entire life.    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  Since that time, she has been on Eliquis .  She has met with hematology oncology and underwent HERD002 evaluation demonstrating that she is high risk of forming another clot.  I have kept her on Eliquis  since that time.  Lower extremity study since 2019 demonstrate thrombus resolution.  Caroline [name] notes ongoing left knee pain.  She was wearing a brace in the office today. No history of previous IVC filter placement.    Past Medical History:  Diagnosis Date   Arthritis    CVA (cerebral vascular accident) (HCC)    Diabetes mellitus without complication (HCC)    GERD (gastroesophageal reflux disease)    Hypertension    Left carpal tunnel syndrome 07/13/2017   OSA on CPAP    PONV (postoperative nausea and vomiting)    Reflux     Past Surgical History:  Procedure Laterality Date   ABDOMINAL HYSTERECTOMY     APPENDECTOMY     EP IMPLANTABLE DEVICE N/A 01/08/2016   Procedure: Loop Recorder Insertion;  Surgeon: Danelle LELON Birmingham, MD;  Location: [facility];  Service: Cardiovascular;  Laterality: N/A;   EYE SURGERY Bilateral    cataract extraction   IR GENERIC HISTORICAL  01/02/2016   IR ANGIO VERTEBRAL SEL VERTEBRAL UNI L MOD SED 01/02/2016 Thyra Nash, MD [facility]   IR GENERIC HISTORICAL  01/02/2016   IR ANGIO INTRA EXTRACRAN SEL COM CAROTID INNOMINATE BILAT MOD SED 01/02/2016 Thyra Nash, MD [facility]   IR GENERIC HISTORICAL  01/02/2016   IR ANGIO VERTEBRAL SEL SUBCLAVIAN INNOMINATE UNI R MOD SED 01/02/2016 Thyra Nash, MD [facility]   KNEE ARTHROSCOPY WITH MEDIAL MENISECTOMY Left 02/18/2024   Procedure: ARTHROSCOPY, KNEE, WITH MEDIAL MENISCECTOMY;  Surgeon: Duwayne Purchase, MD;  Location: [facility];  Service: Orthopedics;  Laterality: Left;   left cyst from breast remove     RADIOLOGY WITH ANESTHESIA N/A 01/02/2016   Procedure: RADIOLOGY WITH ANESTHESIA;  Surgeon: Medication Radiologist, MD;  Location: [facility];  Service: Radiology;  Laterality: N/A;   TEE WITHOUT CARDIOVERSION N/A 01/08/2016   Procedure: TRANSESOPHAGEAL ECHOCARDIOGRAM (TEE);  Surgeon: Ezra GORMAN Shuck, MD;  Location: [facility];  Service: Cardiovascular;  Laterality: N/A;   TONSILLECTOMY      Social History   Socioeconomic History   Marital status: Married    Spouse name: Not on file   Number of children: 2   Years of education: HS   Highest education level: Not on file  Occupational History   Occupation: [employer]  Tobacco Use   Smoking status: Never   Smokeless tobacco: Never  Vaping Use   Vaping status: Never Used  Substance and Sexual Activity   Alcohol  use: No   Drug use: No   Sexual activity: Yes  Other Topics Concern   Not on file  Social History Narrative   Lives at home with her husband.   Left-handed   Occasional caffeine use.   Social Drivers of Health   Tobacco Use: Low Risk (03/30/2024)   Patient History    Smoking Tobacco Use: Never    Smokeless Tobacco Use: Never    Passive Exposure: Not on file  Financial Resource Strain: Not on file  Food Insecurity: No Food Insecurity (02/18/2024)   Epic    Worried About Programme Researcher, Broadcasting/film/video in the Last Year: Never true    Ran Out of Food in the Last Year: Never  true  Transportation Needs: No Transportation Needs (02/18/2024)   Epic    Lack of Transportation (Medical): No    Lack of Transportation (Non-Medical): No  Physical Activity: Not on file  Stress: Not on file  Social Connections: Socially Integrated (02/18/2024)   Social Connection and Isolation Panel    Frequency of Communication with Friends and Family: Three times a week    Frequency of Social Gatherings with Friends and Family: More than three times a week    Attends Religious Services: More than 4 times per year    Active Member of [employer] or Organizations: Yes    Attends Engineer, Structural: More than 4 times per year    Marital Status: Married  Catering Manager Violence: Not At Risk (02/18/2024)   Epic    Fear of Current or Ex-Partner: No    Emotionally Abused: No    Physically Abused: No    Sexually Abused: No  Depression (PHQ2-9): Not on file  Alcohol Screen: Not on file  Housing: Low Risk (02/18/2024)   Epic    Unable to Pay for Housing in the Last Year: No    Number of Times Moved in the Last Year: 0    Homeless in the Last Year: No  Utilities: Not At Risk (02/18/2024)   Epic    Threatened with loss of utilities: No  Health Literacy: Not on file   Family History  Problem Relation Age of Onset   Atrial fibrillation Mother    Suicidality Father        68   Stroke Maternal Grandfather     Current Facility-Administered Medications  Medication Dose Route Frequency Provider Last Rate Last Admin   0.9 %  sodium chloride  infusion   Intravenous Continuous Lanis Fonda BRAVO, MD       fentaNYL  (SUBLIMAZE ) injection    PRN Yuma Blucher E, MD   50 mcg at 05/03/24 9147   Heparin  (Porcine) in NaCl 2000-0.9 UNIT/L-% SOLN    PRN Gilmer Kaminsky E, MD   1,000 mL at 05/03/24 9161   midazolam  PF (VERSED ) injection    PRN Dmarion Perfect E, MD   1 mg at 05/03/24 9147    Allergies[1]   REVIEW OF SYSTEMS:  [X]  denotes positive finding, [ ]  denotes negative  finding Cardiac  Comments:  Chest pain or chest pressure:    Shortness of breath upon exertion:    Short of breath when lying flat:    Irregular heart rhythm:        Vascular    Pain in calf, thigh, or hip brought on by ambulation:    Pain in feet at night that wakes you up from your sleep:     Blood clot in your veins:  Leg swelling:         Pulmonary    Oxygen at home:    Productive cough:     Wheezing:         Neurologic    Sudden weakness in arms or legs:     Sudden numbness in arms or legs:     Sudden onset of difficulty speaking or slurred speech:    Temporary loss of vision in one eye:     Problems with dizziness:         Gastrointestinal    Blood in stool:     Vomited blood:         Genitourinary    Burning when urinating:     Blood in urine:        Psychiatric    Major depression:         Hematologic    Bleeding problems:    Problems with blood clotting too easily:        Skin    Rashes or ulcers:        Constitutional    Fever or chills:      PHYSICAL EXAMINATION:  Vitals:   05/03/24 0743 05/03/24 0837  BP: 114/78   Pulse: 60   Resp: 18   Temp: 97.6 F (36.4 C)   TempSrc: Oral   SpO2: 100% 99%  Weight: 78 kg   Height: 5' 3 (1.6 m)     General:  WDWN in NAD; vital signs documented above Gait: Not observed HENT: WNL, normocephalic Pulmonary: normal non-labored breathing , without wheezing Cardiac: regular HR Abdomen: soft, NT, no masses Skin: without rashes Vascular Exam/Pulses:  Right Left  Radial 2+ (normal) 2+ (normal)  Ulnar    Femoral    Popliteal    DP    PT     Extremities: without ischemic changes, without Gangrene , without cellulitis; with open wounds;  Musculoskeletal: no muscle wasting or atrophy  Neurologic: A&O X 3;  No focal weakness or paresthesias are detected Psychiatric:  The pt has Normal affect.   Non-Invasive Vascular Imaging:   None    ASSESSMENT/PLAN: Caroline [name] is a 69 y.o. female  presenting with history of unprovoked DVT.  She is at high risk per hematology oncology for repeat thrombus formation, and therefore continues on anticoagulation.  I had a long conversation with Surina regarding the above.  I think it is reasonable to place an IVC filter to decrease her risk of pulmonary embolus status post TKR.  Plan will be to place this a few weeks prior to surgery, with plans to remove the filter in the following 3 months.  She is aware that the longer the filter stays in place, the harder it is to retrieve.  IVC filter placement will be pending knee surgery scheduling.  I will not place unless the surgery is scheduled as I have had issues in the past where surgery was canceled leaving the patient with a filter that needed to be removed.  At the time of filter placement, I will evaluate the IVC to ensure that it is amenable to filter placement.  I have reviewed imaging as far back as 1999 to see if the patient had a CT of her abdomen.  The only CT I could find was from 1999, and only the read was available    Fonda FORBES Rim, MD [facility] [phone]     [1]  Allergies Allergen Reactions   Amoxicillin Rash  Azithromycin Rash   Homatropine Rash   Hydrocodone  Bit-Homatrop Mbr Rash   Penicillin G Rash    Dyspepsia, ingestion   Tape Rash   "

## 2024-05-03 NOTE — Discharge Instructions (Signed)
 Femoral Site Care This sheet gives you information about how to care for yourself after your procedure. Your health care provider may also give you more specific instructions. If you have problems or questions, contact your health care provider. What can I expect after the procedure?  After the procedure, it is common to have: Bruising that usually fades within 1-2 weeks. Tenderness at the site. Follow these instructions at home: Wound care Follow instructions from your health care provider about how to take care of your insertion site. Make sure you: Wash your hands with soap and water before you change your bandage (dressing). If soap and water are not available, use hand sanitizer. Remove your dressing as told by your health care provider. In 24 hours Do not take baths, swim, or use a hot tub until your health care provider approves. You may shower 24-48 hours after the procedure or as told by your health care provider. Gently wash the site with plain soap and water. Pat the area dry with a clean towel. Do not rub the site. This may cause bleeding. Do not apply powder or lotion to the site. Keep the site clean and dry. Check your femoral site every day for signs of infection. Check for: Redness, swelling, or pain. Fluid or blood. Warmth. Pus or a bad smell. Activity For the first 2-3 days after your procedure, or as long as directed: Avoid climbing stairs as much as possible. Do not squat. Do not lift anything that is heavier than 10 lb (4.5 kg), or the limit that you are told, until your health care provider says that it is safe. For 5 days Rest as directed. Avoid sitting for a long time without moving. Get up to take short walks every 1-2 hours. Do not drive for 24 hours if you were given a medicine to help you relax (sedative). General instructions Take over-the-counter and prescription medicines only as told by your health care provider. Keep all follow-up visits as told by  your health care provider. This is important. Contact a health care provider if you have: A fever or chills. You have redness, swelling, or pain around your insertion site. Get help right away if: The catheter insertion area swells very fast. You pass out. You suddenly start to sweat or your skin gets clammy. The catheter insertion area is bleeding, and the bleeding does not stop when you hold steady pressure on the area. The area near or just beyond the catheter insertion site becomes pale, cool, tingly, or numb. These symptoms may represent a serious problem that is an emergency. Do not wait to see if the symptoms will go away. Get medical help right away. Call your local emergency services (911 in the [place] Do not drive yourself to the hospital. Summary After the procedure, it is common to have bruising that usually fades within 1-2 weeks. Check your femoral site every day for signs of infection. Do not lift anything that is heavier than 10 lb (4.5 kg), or the limit that you are told, until your health care provider says that it is safe. This information is not intended to replace advice given to you by your health care provider. Make sure you discuss any questions you have with your health care provider. Document Revised: 04/19/2017 Document Reviewed: 04/19/2017 Elsevier Patient Education  2020 [employer].

## 2024-05-03 NOTE — Progress Notes (Signed)
 Nurse to the bedside to assist patient with ambulating to the bathroom for post procedure void. Patient voids large amount, steady gait with cane to the bathroom. (Patient had knee in the past couple of months.) Spouse at the bedside for emotional support. Discharge instructions reviewed with patient and spouse, copy given. No active bleeding noted over right groin area, dressing clean dry and intact. No hematoma observed.

## 2024-05-04 ENCOUNTER — Other Ambulatory Visit (HOSPITAL_COMMUNITY): Payer: Self-pay

## 2024-05-06 NOTE — Patient Instructions (Signed)
 SURGICAL WAITING ROOM VISITATION Patients having surgery or a procedure may have no more than 2 support people in the waiting area - these visitors may rotate in the visitor waiting room.   If the patient needs to stay at the hospital during part of their recovery, the visitor guidelines for inpatient rooms apply.  PRE-OP VISITATION  Pre-op nurse will coordinate an appropriate time for 1 support person to accompany the patient in pre-op.  This support person may not rotate.  This visitor will be contacted when the time is appropriate for the visitor to come back in the pre-op area.  Please refer to the [facility] website for the visitor guidelines for Inpatients (after your surgery is over and you are in a regular room).  Temporary Visitor Restrictions  Children ages 80 and under will not be able to visit patients in [facility] under most circumstances. Visitation is not restricted outside of hospitals unless noted otherwise in the [facility] and Location Specific Visitation Guidelines at :      [email or web address] Visitors with respiratory illnesses are discouraged from visiting and should remain at home.  You are not required to quarantine at this time prior to your surgery. However, you must do this: Hand Hygiene often Do NOT share personal items Notify your provider if you are in close contact with someone who has COVID or you develop fever 100.4 or greater, new onset of sneezing, cough, sore throat, shortness of breath or body aches.  If you test positive for Covid or have been in contact with anyone that has tested positive in the last 10 days please notify you surgeon.    Your procedure is scheduled on:  [facility]  05-17-2024  Report to [facility] Main Entrance: [place] entrance where the [employer] is available.   Report to admitting at:  07:45   AM  Call this number if you have any questions or problems the morning of surgery [phone]  Do not eat  food after Midnight the night prior to your surgery/procedure.  After Midnight you may have the following liquids until   07:15 AM DAY OF SURGERY  Clear Liquid Diet Water Black Coffee (sugar ok, NO MILK/CREAM OR CREAMERS)  Tea (sugar ok, NO MILK/CREAM OR CREAMERS) regular and decaf                             Plain Jell-O  with no fruit (NO RED)                                           Fruit ices (not with fruit pulp, NO RED)                                     Popsicles (NO RED)                                                                  Juice: NO CITRUS JUICES: only apple, WHITE grape, WHITE cranberry Sports drinks like Gatorade or Powerade (NO RED)  The day of surgery:  Drink ONE (1) Pre-Surgery G2 at  07:15  AM the morning of surgery. Drink in one sitting. Do not sip.  This drink was given to you during your hospital pre-op appointment visit. Nothing else to drink after completing the Pre-Surgery Clear Ensure or G2 : No candy, chewing gum or throat lozenges.    FOLLOW ANY ADDITIONAL PRE OP INSTRUCTIONS YOU RECEIVED FROM YOUR SURGEON'S OFFICE!!!   Oral Hygiene is also important to reduce your risk of infection.        Remember - BRUSH YOUR TEETH THE MORNING OF SURGERY WITH YOUR REGULAR TOOTHPASTE  Do NOT smoke after Midnight the night before surgery.  STOP TAKING all Vitamins, Herbs and supplements 1 week before your surgery.   ELIQUIS - stop taking 48 hours before your surgery.  Last dose will be taken on Sunday 05-14-24.  Take ONLY these medicines the morning of surgery with A SIP OF WATER: Fexofenadine (ALLEGRA), Valacyclovir  (VALTREX ), Omeprazole (PRILOSEC),  tramadol (ULTRAM) Eye drops, Flonase  nasal spray,   (Nifedipine  (PROCARDIA ) take as usual the night before your surgery)  DO NOT TAKE Losartan  (COZAAR ) the morning of your surgery.    If You have been diagnosed with Sleep Apnea - Bring CPAP mask and tubing day of surgery. We will provide you  with a CPAP machine on the day of your surgery.                   You may not have any metal on your body including hair pins, jewelry, and body piercing  Do not wear make-up, lotions, powders, perfumes or deodorant  Do not wear nail polish including gel and S&S, artificial / acrylic nails, or any other type of covering on natural nails including finger and toenails. If you have artificial nails, gel coating, etc., that needs to be removed by a nail salon, Please have this removed prior to surgery. Not doing so may mean that your surgery could be cancelled or delayed if the Surgeon or anesthesia staff feels like they are unable to monitor you safely.   Do not shave 48 hours prior to surgery to avoid nicks in your skin which may contribute to postoperative infections.   Contacts, Hearing Aids, dentures or bridgework may not be worn into surgery. DENTURES WILL BE REMOVED PRIOR TO SURGERY PLEASE DO NOT APPLY Poly grip OR ADHESIVES!!!  You may bring a small overnight bag with you on the day of surgery, only pack items that are not valuable. [facility] IS NOT RESPONSIBLE   FOR VALUABLES THAT ARE LOST OR STOLEN.   Do not bring your home medications to the hospital. The Pharmacy will dispense medications listed on your medication list to you during your admission in the Hospital.  Please read over the following fact sheets you were given: IF YOU HAVE QUESTIONS ABOUT YOUR PRE-OP INSTRUCTIONS, PLEASE CALL [phone].      Pre-operative 4 CHG Bath Instructions   You can play a key role in reducing the risk of infection after surgery. Your skin needs to be as free of germs as possible. You can reduce the number of germs on your skin by washing with CHG (chlorhexidine  gluconate) soap before surgery. CHG is an antiseptic soap that kills germs and continues to kill germs even after washing.   DO NOT use if you have an allergy to chlorhexidine /CHG or antibacterial soaps. If your skin becomes reddened  or irritated, stop using the CHG and notify one of our  RNs at [phone]  Please shower with the CHG soap starting 4 days before surgery using the following schedule: SATURDAY 05-13-2024      Do NOT use CHG soap                                                                                                                      the morning of your                                                                                                                                 surgery.         Please keep in mind the following:  DO NOT shave, including legs and underarms, starting the day of your first shower.   You may shave your face at any point before/day of surgery.  Place clean sheets on your bed the day you start using CHG soap. Use a clean washcloth (not used since being washed) for each shower. DO NOT sleep with pets once you start using the CHG.  CHG Shower Instructions:  If you choose to wash your hair and private area, wash first with your normal shampoo/soap.  After you use shampoo/soap, rinse your hair and body thoroughly to remove shampoo/soap residue.  Turn the water OFF and apply about 3 tablespoons (45 ml) of CHG soap to a CLEAN washcloth.  Apply CHG soap ONLY FROM YOUR NECK DOWN TO YOUR TOES (washing for 3-5 minutes)  DO NOT use CHG soap on face, private areas, open wounds, or sores.  Pay special attention to the area where your surgery is being performed.  If you are having back surgery, having someone wash your back for you may be helpful. Wait 2 minutes after CHG soap is applied, then you may rinse off the CHG soap.  Pat dry with a clean towel  Put on clean clothes/pajamas   If you choose to wear lotion, please use ONLY the CHG-compatible lotions on the back of this paper.     Additional instructions for the day of surgery: DO NOT APPLY any CHG Soap,  lotions, deodorants, cologne, or perfumes on the day of surgery  Put on clean/comfortable clothes.  Brush your teeth.   Ask your nurse before applying any prescription medications to the skin.   CHG Compatible Lotions   Aveeno Moisturizing lotion  Cetaphil Moisturizing Cream  Cetaphil Moisturizing Lotion  Clairol Herbal Essence Moisturizing Lotion, Dry Skin  Clairol Herbal Essence Moisturizing Lotion, Extra Dry Skin  Clairol Herbal Essence Moisturizing Lotion, Normal Skin  Curel Age Defying Therapeutic Moisturizing Lotion with Alpha Hydroxy  Curel Extreme Care Body Lotion  Curel Soothing Hands Moisturizing Hand Lotion  Curel Therapeutic Moisturizing Cream, Fragrance-Free  Curel Therapeutic Moisturizing Lotion, Fragrance-Free  Curel Therapeutic Moisturizing Lotion, Original Formula  Eucerin Daily Replenishing Lotion  Eucerin Dry Skin Therapy Plus Alpha Hydroxy Crme  Eucerin Dry Skin Therapy Plus Alpha Hydroxy Lotion  Eucerin Original Crme  Eucerin Original Lotion  Eucerin Plus Crme Eucerin Plus Lotion  Eucerin TriLipid Replenishing Lotion  Keri Anti-Bacterial Hand Lotion  Keri Deep Conditioning Original Lotion Dry Skin Formula Softly Scented  Keri Deep Conditioning Original Lotion, Fragrance Free Sensitive Skin Formula  Keri Lotion Fast Absorbing Fragrance Free Sensitive Skin Formula  Keri Lotion Fast Absorbing Softly Scented Dry Skin Formula  Keri Original Lotion  Keri Skin Renewal Lotion Keri Silky Smooth Lotion  Keri Silky Smooth Sensitive Skin Lotion  Nivea Body Creamy Conditioning Oil  Nivea Body Extra Enriched Lotion  Nivea Body Original Lotion  Nivea Body Sheer Moisturizing Lotion Nivea Crme  Nivea Skin Firming Lotion  NutraDerm 30 Skin Lotion  NutraDerm Skin Lotion  NutraDerm Therapeutic Skin Cream  NutraDerm Therapeutic Skin Lotion  ProShield Protective Hand Cream  Provon moisturizing lotion   FAILURE TO FOLLOW THESE INSTRUCTIONS MAY RESULT IN THE CANCELLATION OF YOUR SURGERY  PATIENT SIGNATURE_________________________________  NURSE  SIGNATURE__________________________________  ________________________________________________________________________        Nasario Exon    An incentive spirometer is a tool that can help keep your lungs clear and active. This tool measures how well you are filling your lungs with each breath. Taking long deep breaths may help reverse or decrease the chance of developing breathing (pulmonary) problems (especially infection) following: A long period of time when you are unable to move or be active. BEFORE THE PROCEDURE  If the spirometer includes an indicator to show your best effort, your nurse or respiratory therapist will set it to a desired goal. If possible, sit up straight or lean slightly forward. Try not to slouch. Hold the incentive spirometer in an upright position. INSTRUCTIONS FOR USE  Sit on the edge of your bed if possible, or sit up as far as you can in bed or on a chair. Hold the incentive spirometer in an upright position. Breathe out normally. Place the mouthpiece in your mouth and seal your lips tightly around it. Breathe in slowly and as deeply as possible, raising the piston or the ball toward the top of the column. Hold your breath for 3-5 seconds or for as long as possible. Allow the piston or ball to fall to the bottom of the column. Remove the mouthpiece from your mouth and breathe out normally. Rest for a few seconds and repeat Steps 1 through 7 at least 10 times every 1-2 hours when you are awake. Take your time and take a few normal breaths between deep breaths. The spirometer may include an indicator to show your best effort. Use the indicator as a goal to work toward during each repetition. After each set of 10 deep breaths, practice coughing to be sure your lungs are clear. If you have an incision (the cut made at the time of surgery), support your incision when coughing by placing a pillow or rolled up towels firmly against it. Once you are able  to get out of bed, walk around indoors and cough well. You may stop using the  incentive spirometer when instructed by your caregiver.  RISKS AND COMPLICATIONS Take your time so you do not get dizzy or light-headed. If you are in pain, you may need to take or ask for pain medication before doing incentive spirometry. It is harder to take a deep breath if you are having pain. AFTER USE Rest and breathe slowly and easily. It can be helpful to keep track of a log of your progress. Your caregiver can provide you with a simple table to help with this. If you are using the spirometer at home, follow these instructions: SEEK MEDICAL CARE IF:  You are having difficultly using the spirometer. You have trouble using the spirometer as often as instructed. Your pain medication is not giving enough relief while using the spirometer. You develop fever of 100.5 F (38.1 C) or higher.                                                                                                    SEEK IMMEDIATE MEDICAL CARE IF:  You cough up bloody sputum that had not been present before. You develop fever of 102 F (38.9 C) or greater. You develop worsening pain at or near the incision site. MAKE SURE YOU:  Understand these instructions. Will watch your condition. Will get help right away if you are not doing well or get worse. Document Released: 08/17/2006 Document Revised: 06/29/2011 Document Reviewed: 10/18/2006 [facility] Patient Information 2014 [place], [place].         If you would like to see a video about joint replacement:   [email or web address]

## 2024-05-06 NOTE — Progress Notes (Signed)
 COVID Vaccine received:  []  No [x]  Yes Date of any COVID positive Test in last 90 days:  PCP - Murray Amos, MD at [facility] [phone] (Work) [phone] (Fax)  medical clearance scanned to Media and in 04-19-2024 Epic note     Cardiologist -  none Hematology- Vallathucherry Leverette, MD   Vascular- J. Cheryle Rim, MD  Neurology- Eather Popp, MD    Chest x-ray - 05-28-2023  2v in CE EKG -  05-03-2024  Epic Stress Test -  ECHO -  Cardiac Cath -  CT Coronary Calcium  score:   Pacemaker / ICD device [x]  No []  Yes   Spinal Cord Stimulator:[x]  No []  Yes       History of Sleep Apnea? []  No [x]  Yes   CPAP used?- []  No [x]  Yes    Medication on DOS: Fexofenadine (ALLEGRA), Valacyclovir  (VALTREX ), Omeprazole (PRILOSEC),  tramadol (ULTRAM) Eye drops, Flonase  nasal spray,   (Nifedipine  (PROCARDIA ) takes q hs)  Hold DOS: Losartan  (COZAAR )  Patient has: []  NO Hx DM   []  Pre-DM   []  DM1  [x]   DM2 Does the patient monitor blood sugar?   []  N/A   []  No [x]  Yes  Last A1c was:  5.9 on 02-14-2024  Does patient have a [employer] or Dexcom? []  No []  Yes   Fasting Blood Sugar Ranges-  Checks Blood Sugar _____ times a day  Blood Thinner / Instructions: ELIQUIS   bid   Hold x 48 hrs.  Last dose: 05-14-24 Aspirin  Instructions:  Activity level: Able to walk up 2 flights of stairs without becoming significantly short of breath or having chest pain?   [x]    Yes   []  No,  would have:  Patient can perform ADLs without assistance.  [x]   Yes    []  No   Comments: had IVC filter inserted on 05-03-2024 by Dr. 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  Patient verbalized understanding and agreement to the Pre-Surgical Instructions  that were given to them at this PAT appointment. Patient was also educated of the need to review these PAT instructions again prior to his/her surgery.I reviewed the appropriate phone numbers to call if they have any and questions or concerns.

## 2024-05-08 ENCOUNTER — Encounter (HOSPITAL_COMMUNITY)
Admission: RE | Admit: 2024-05-08 | Discharge: 2024-05-08 | Disposition: A | Source: Ambulatory Visit | Attending: Specialist | Admitting: Specialist

## 2024-05-08 ENCOUNTER — Other Ambulatory Visit: Payer: Self-pay

## 2024-05-08 ENCOUNTER — Encounter (HOSPITAL_COMMUNITY): Payer: Self-pay

## 2024-05-08 DIAGNOSIS — K219 Gastro-esophageal reflux disease without esophagitis: Secondary | ICD-10-CM | POA: Insufficient documentation

## 2024-05-08 DIAGNOSIS — Z7901 Long term (current) use of anticoagulants: Secondary | ICD-10-CM | POA: Diagnosis not present

## 2024-05-08 DIAGNOSIS — M1712 Unilateral primary osteoarthritis, left knee: Secondary | ICD-10-CM | POA: Insufficient documentation

## 2024-05-08 DIAGNOSIS — I10 Essential (primary) hypertension: Secondary | ICD-10-CM | POA: Diagnosis not present

## 2024-05-08 DIAGNOSIS — G4733 Obstructive sleep apnea (adult) (pediatric): Secondary | ICD-10-CM | POA: Insufficient documentation

## 2024-05-08 DIAGNOSIS — Z86718 Personal history of other venous thrombosis and embolism: Secondary | ICD-10-CM | POA: Diagnosis not present

## 2024-05-08 DIAGNOSIS — E119 Type 2 diabetes mellitus without complications: Secondary | ICD-10-CM | POA: Insufficient documentation

## 2024-05-08 DIAGNOSIS — Z01812 Encounter for preprocedural laboratory examination: Secondary | ICD-10-CM | POA: Insufficient documentation

## 2024-05-08 DIAGNOSIS — Z8673 Personal history of transient ischemic attack (TIA), and cerebral infarction without residual deficits: Secondary | ICD-10-CM | POA: Insufficient documentation

## 2024-05-08 LAB — CBC
HCT: 44.8 % (ref 36.0–46.0)
Hemoglobin: 14.5 g/dL (ref 12.0–15.0)
MCH: 32.2 pg (ref 26.0–34.0)
MCHC: 32.4 g/dL (ref 30.0–36.0)
MCV: 99.6 fL (ref 80.0–100.0)
Platelets: 197 K/uL (ref 150–400)
RBC: 4.5 MIL/uL (ref 3.87–5.11)
RDW: 12.6 % (ref 11.5–15.5)
WBC: 9.6 K/uL (ref 4.0–10.5)
nRBC: 0 % (ref 0.0–0.2)

## 2024-05-08 LAB — BASIC METABOLIC PANEL WITH GFR
Anion gap: 11 (ref 5–15)
BUN: 15 mg/dL (ref 8–23)
CO2: 23 mmol/L (ref 22–32)
Calcium: 9.6 mg/dL (ref 8.9–10.3)
Chloride: 107 mmol/L (ref 98–111)
Creatinine, Ser: 0.84 mg/dL (ref 0.44–1.00)
GFR, Estimated: >60 mL/min
Glucose, Bld: 140 mg/dL — ABNORMAL HIGH (ref 70–99)
Potassium: 4.4 mmol/L (ref 3.5–5.1)
Sodium: 141 mmol/L (ref 135–145)

## 2024-05-08 LAB — HEMOGLOBIN A1C
Hgb A1c MFr Bld: 6.4 % — ABNORMAL HIGH (ref 4.8–5.6)
Mean Plasma Glucose: 136.98 mg/dL

## 2024-05-08 LAB — SURGICAL PCR SCREEN
MRSA, PCR: NEGATIVE
Staphylococcus aureus: NEGATIVE

## 2024-05-08 LAB — GLUCOSE, CAPILLARY: Glucose-Capillary: 148 mg/dL — ABNORMAL HIGH (ref 70–99)

## 2024-05-08 NOTE — Patient Instructions (Signed)
 SURGICAL WAITING ROOM VISITATION Patients having surgery or a procedure may have no more than 2 support people in the waiting area - these visitors may rotate in the visitor waiting room.   If the patient needs to stay at the hospital during part of their recovery, the visitor guidelines for inpatient rooms apply.  PRE-OP VISITATION  Pre-op nurse will coordinate an appropriate time for 1 support person to accompany the patient in pre-op.  This support person may not rotate.  This visitor will be contacted when the time is appropriate for the visitor to come back in the pre-op area.  Please refer to the [facility] website for the visitor guidelines for Inpatients (after your surgery is over and you are in a regular room).  Temporary Visitor Restrictions  Children ages 65 and under will not be able to visit patients in [facility] under most circumstances. Visitation is not restricted outside of hospitals unless noted otherwise in the [facility] and Location Specific Visitation Guidelines at :      [email or web address] Visitors with respiratory illnesses are discouraged from visiting and should remain at home.  You are not required to quarantine at this time prior to your surgery. However, you must do this: Hand Hygiene often Do NOT share personal items Notify your provider if you are in close contact with someone who has COVID or you develop fever 100.4 or greater, new onset of sneezing, cough, sore throat, shortness of breath or body aches.  If you test positive for Covid or have been in contact with anyone that has tested positive in the last 10 days please notify you surgeon.    Your procedure is scheduled on:  Wednesday 05-17-2024  Report to [facility] Main Entrance: [address] entrance where the [employer] is available.   Report to admitting at:  07:45   AM  Call this number if you have any questions or problems the morning of surgery [phone]  Do not eat  food after Midnight the night prior to your surgery/procedure.  After Midnight you may have the following liquids until   07:15  AM DAY OF SURGERY  Clear Liquid Diet Water Black Coffee (sugar ok, NO MILK/CREAM OR CREAMERS)  Tea (sugar ok, NO MILK/CREAM OR CREAMERS) regular and decaf                             Plain Jell-O  with no fruit (NO RED)                                           Fruit ices (not with fruit pulp, NO RED)                                     Popsicles (NO RED)                                                                  Juice: NO CITRUS JUICES: only apple, WHITE grape, WHITE cranberry Sports drinks like Gatorade or Powerade (NO RED)  The day of surgery:  Drink ONE (1) Pre-Surgery  G2 at   07:15  AM the morning of surgery. Drink in one sitting. Do not sip.  This drink was given to you during your hospital pre-op appointment visit. Nothing else to drink after completing the Pre-Surgery  G2 : No candy, chewing gum or throat lozenges.    FOLLOW ANY ADDITIONAL PRE OP INSTRUCTIONS YOU RECEIVED FROM YOUR SURGEON'S OFFICE!!!   Oral Hygiene is also important to reduce your risk of infection.        Remember - BRUSH YOUR TEETH THE MORNING OF SURGERY WITH YOUR REGULAR TOOTHPASTE  Do NOT smoke after Midnight the night before surgery.  STOP TAKING all Vitamins, Herbs and supplements 1 week before your surgery.   Take ONLY these medicines the morning of surgery with A SIP OF WATER:  Fexofenadine (ALLEGRA), Valacyclovir  (VALTREX ), Omeprazole (PRILOSEC),  tramadol (ULTRAM) Eye drops, Flonase  nasal spray,   DO NOT TAKE Losartan  (COZAAR ) the morning of your surgery.    If You have been diagnosed with Sleep Apnea - Bring CPAP mask and tubing day of surgery. We will provide you with a CPAP machine on the day of your surgery.                   You may not have any metal on your body including hair pins, jewelry, and body piercing  Do not wear make-up,  lotions, powders, perfumes / cologne, or deodorant  Do not wear nail polish including gel and S&S, artificial / acrylic nails, or any other type of covering on natural nails including finger and toenails. If you have artificial nails, gel coating, etc., that needs to be removed by a nail salon, Please have this removed prior to surgery. Not doing so may mean that your surgery could be cancelled or delayed if the Surgeon or anesthesia staff feels like they are unable to monitor you safely.   Do not shave 48 hours prior to surgery to avoid nicks in your skin which may contribute to postoperative infections.   Contacts, Hearing Aids, dentures or bridgework may not be worn into surgery. DENTURES WILL BE REMOVED PRIOR TO SURGERY PLEASE DO NOT APPLY Poly grip OR ADHESIVES!!!  You may bring a small overnight bag with you on the day of surgery, only pack items that are not valuable. [facility] IS NOT RESPONSIBLE   FOR VALUABLES THAT ARE LOST OR STOLEN.   Do not bring your home medications to the hospital. The Pharmacy will dispense medications listed on your medication list to you during your admission in the Hospital.  Special Instructions: Bring a copy of your healthcare power of attorney and living will documents the day of surgery, if you wish to have them scanned into your [facility] Medical Records- EPIC  Please read over the following fact sheets you were given: IF YOU HAVE QUESTIONS ABOUT YOUR PRE-OP INSTRUCTIONS, PLEASE CALL [phone].      Pre-operative 4 CHG Bath Instructions   You can play a key role in reducing the risk of infection after surgery. Your skin needs to be as free of germs as possible. You can reduce the number of germs on your skin by washing with CHG (chlorhexidine  gluconate) soap before surgery. CHG is an antiseptic soap that kills germs and continues to kill germs even after washing.   DO NOT use if you have an allergy to chlorhexidine /CHG or antibacterial soaps.  If your skin becomes reddened or irritated, stop using  the CHG and notify one of our RNs at [phone]  Please shower with the CHG soap starting 4 days before surgery using the following schedule:   05-13-2024  Saturday     Do NOT use CHG soap                                                                                                                      the morning of your                                                                                                                                 surgery.         Please keep in mind the following:  DO NOT shave, including legs and underarms, starting the day of your first shower.   You may shave your face at any point before/day of surgery.  Place clean sheets on your bed the day you start using CHG soap. Use a clean washcloth (not used since being washed) for each shower. DO NOT sleep with pets once you start using the CHG.  CHG Shower Instructions:  If you choose to wash your hair and private area, wash first with your normal shampoo/soap.  After you use shampoo/soap, rinse your hair and body thoroughly to remove shampoo/soap residue.  Turn the water OFF and apply about 3 tablespoons (45 ml) of CHG soap to a CLEAN washcloth.  Apply CHG soap ONLY FROM YOUR NECK DOWN TO YOUR TOES (washing for 3-5 minutes)  DO NOT use CHG soap on face, private areas, open wounds, or sores.  Pay special attention to the area where your surgery is being performed.  If you are having back surgery, having someone wash your back for you may be helpful. Wait 2 minutes after CHG soap is applied, then you may rinse off the CHG soap.  Pat dry with a clean towel  Put on clean clothes/pajamas   If you choose to wear lotion, please use ONLY the CHG-compatible lotions on the back of this paper.     Additional instructions for the day of surgery: DO NOT APPLY any CHG Soap,  lotions, deodorants, cologne, or perfumes on the day of surgery  Put on  clean/comfortable clothes.  Brush your teeth.  Ask your nurse before applying any prescription medications to the skin.   CHG Compatible Lotions   Aveeno Moisturizing lotion  Cetaphil Moisturizing Cream  Cetaphil Moisturizing Lotion  Clairol Herbal Essence Moisturizing Lotion, Dry Skin  Clairol Herbal Essence Moisturizing Lotion, Extra Dry Skin  Clairol Herbal Essence Moisturizing Lotion, Normal Skin  Curel Age Defying Therapeutic Moisturizing Lotion with Alpha Hydroxy  Curel Extreme Care Body Lotion  Curel Soothing Hands Moisturizing Hand Lotion  Curel Therapeutic Moisturizing Cream, Fragrance-Free  Curel Therapeutic Moisturizing Lotion, Fragrance-Free  Curel Therapeutic Moisturizing Lotion, Original Formula  Eucerin Daily Replenishing Lotion  Eucerin Dry Skin Therapy Plus Alpha Hydroxy Crme  Eucerin Dry Skin Therapy Plus Alpha Hydroxy Lotion  Eucerin Original Crme  Eucerin Original Lotion  Eucerin Plus Crme Eucerin Plus Lotion  Eucerin TriLipid Replenishing Lotion  Keri Anti-Bacterial Hand Lotion  Keri Deep Conditioning Original Lotion Dry Skin Formula Softly Scented  Keri Deep Conditioning Original Lotion, Fragrance Free Sensitive Skin Formula  Keri Lotion Fast Absorbing Fragrance Free Sensitive Skin Formula  Keri Lotion Fast Absorbing Softly Scented Dry Skin Formula  Keri Original Lotion  Keri Skin Renewal Lotion Keri Silky Smooth Lotion  Keri Silky Smooth Sensitive Skin Lotion  Nivea Body Creamy Conditioning Oil  Nivea Body Extra Enriched Lotion  Nivea Body Original Lotion  Nivea Body Sheer Moisturizing Lotion Nivea Crme  Nivea Skin Firming Lotion  NutraDerm 30 Skin Lotion  NutraDerm Skin Lotion  NutraDerm Therapeutic Skin Cream  NutraDerm Therapeutic Skin Lotion  ProShield Protective Hand Cream  Provon moisturizing lotion   FAILURE TO FOLLOW THESE INSTRUCTIONS MAY RESULT IN THE CANCELLATION OF YOUR SURGERY  PATIENT  SIGNATURE_________________________________  NURSE SIGNATURE__________________________________  ________________________________________________________________________      Caroline [name]    An incentive spirometer is a tool that can help keep your lungs clear and active. This tool measures how well you are filling your lungs with each breath. Taking long deep breaths may help reverse or decrease the chance of developing breathing (pulmonary) problems (especially infection) following: A long period of time when you are unable to move or be active. BEFORE THE PROCEDURE  If the spirometer includes an indicator to show your best effort, your nurse or respiratory therapist will set it to a desired goal. If possible, sit up straight or lean slightly forward. Try not to slouch. Hold the incentive spirometer in an upright position. INSTRUCTIONS FOR USE  Sit on the edge of your bed if possible, or sit up as far as you can in bed or on a chair. Hold the incentive spirometer in an upright position. Breathe out normally. Place the mouthpiece in your mouth and seal your lips tightly around it. Breathe in slowly and as deeply as possible, raising the piston or the ball toward the top of the column. Hold your breath for 3-5 seconds or for as long as possible. Allow the piston or ball to fall to the bottom of the column. Remove the mouthpiece from your mouth and breathe out normally. Rest for a few seconds and repeat Steps 1 through 7 at least 10 times every 1-2 hours when you are awake. Take your time and take a few normal breaths between deep breaths. The spirometer may include an indicator to show your best effort. Use the indicator as a goal to work toward during each repetition. After each set of 10 deep breaths, practice coughing to be sure your lungs are clear. If you have an incision (the cut made at the time of surgery), support your incision when coughing by placing a pillow or rolled  up towels firmly against it. Once you are able to get out of bed, walk around indoors and cough  well. You may stop using the incentive spirometer when instructed by your caregiver.  RISKS AND COMPLICATIONS Take your time so you do not get dizzy or light-headed. If you are in pain, you may need to take or ask for pain medication before doing incentive spirometry. It is harder to take a deep breath if you are having pain. AFTER USE Rest and breathe slowly and easily. It can be helpful to keep track of a log of your progress. Your caregiver can provide you with a simple table to help with this. If you are using the spirometer at home, follow these instructions: SEEK MEDICAL CARE IF:  You are having difficultly using the spirometer. You have trouble using the spirometer as often as instructed. Your pain medication is not giving enough relief while using the spirometer. You develop fever of 100.5 F (38.1 C) or higher.                                                                                                    SEEK IMMEDIATE MEDICAL CARE IF:  You cough up bloody sputum that had not been present before. You develop fever of 102 F (38.9 C) or greater. You develop worsening pain at or near the incision site. MAKE SURE YOU:  Understand these instructions. Will watch your condition. Will get help right away if you are not doing well or get worse. Document Released: 08/17/2006 Document Revised: 06/29/2011 Document Reviewed: 10/18/2006 [facility] Patient Information 2014 [place], [place].

## 2024-05-08 NOTE — Progress Notes (Signed)
 COVID Vaccine received:  []  No [x]  Yes Date of any COVID positive Test in last 90 days: None  PCP - Murray Amos, MD at [facility] [phone] (Work) [phone] (Fax)  medical clearance scanned to Media and in 04-19-2024 Epic note     Cardiologist -  none Hematology- Vallathucherry Leverette, MD   Vascular- J. Cheryle Rim, MD  Neurology- Eather Popp, MD    Chest x-ray - 05-28-2023  2v in CE EKG -  05-03-2024  Epic Stress Test -  ECHO -  Cardiac Cath -  CT Coronary Calcium  score:   Pacemaker / ICD device [x]  No []  Yes   Spinal Cord Stimulator:[x]  No []  Yes       History of Sleep Apnea? []  No [x]  Yes   CPAP used?- []  No [x]  Yes    Medication on DOS: Fexofenadine (ALLEGRA), Valacyclovir  (VALTREX ), Omeprazole (PRILOSEC),  tramadol (ULTRAM) Eye drops, Flonase  nasal spray,   (Nifedipine  (PROCARDIA ) takes q hs)  Hold DOS: Losartan  (COZAAR )  Patient has: []  NO Hx DM   []  Pre-DM   []  DM1  [x]   DM2 Does the patient monitor blood sugar?   []  N/A   []  No [x]  Yes  Last A1c was:  5.9 on 02-14-2024   Blood Thinner / Instructions: ELIQUIS   bid   Hold x 48 hrs.  Last dose: 05-14-24 Aspirin  Instructions:  Activity level: Able to walk up 2 flights of stairs without becoming significantly short of breath or having chest pain?   [x]    Yes   []  No,  would have:  Patient can perform ADLs without assistance.  [x]   Yes    []  No   Comments: had IVC filter inserted on 05-03-2024 by Dr. 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  Patient verbalized understanding and agreement to the Pre-Surgical Instructions that were given to them at this PAT appointment. Patient was also educated of the need to review these PAT instructions again  prior to his/her surgery.I reviewed the appropriate phone numbers to call if they have any and questions or concerns.

## 2024-05-09 ENCOUNTER — Encounter (HOSPITAL_COMMUNITY): Payer: Self-pay

## 2024-05-09 NOTE — Progress Notes (Addendum)
 " Case: [id number] Date/Time: 05/17/24 1006   Procedure: ARTHROPLASTY, KNEE, TOTAL (Left: Knee)   Anesthesia type: Spinal   Diagnosis: Primary osteoarthritis of left knee [M17.12]   Pre-op diagnosis: degnerative joint disease   [facility]: [facility] ROOM 08 / [facility]   Surgeons: Duwayne Purchase, MD       DISCUSSION: Caroline [name] is a 69 yo female with PMH of HTN, hx of DVT s/p IVC filter (05/03/24), OSA on CPAP, hx of CVA (2017), GERD, DM (A1c 6.4), arthritis, PONV  Patient underwent L knee scope on 02/18/24 which was without complications.  Hx of unprovoked DVT on Eliquis . Seen by Vascular on 03/30/24 for consideration of IVC filter due to high risk of clots. IVC filter inserted on 1/14.   Hx of CVA in 2017. Reportedly with only mild word finding difficulties  Seen by PCP on 1016/25 for pre op exam prior to knee surgery in October. She was cleared as low risk at that time. No major updates to medical hx since then.  LD Eliquis : 1/25 LD Mounjaro: 1/4  VS: BP 139/69 Comment: right arm sitting  Temp 37.1 C (Oral)   Resp 16   Ht 5' 3 (1.6 m)   Wt 79.8 kg   SpO2 100%   BMI 31.18 kg/m   PROVIDERS: Delilah Murray HERO., MD Hematology- Vallathucherry Leverette, MD   Vascular- J. Cheryle Rim, MD  Neurology- Eather Popp, MD   LABS: Labs reviewed: Acceptable for surgery. (all labs ordered are listed, but only abnormal results are displayed)  Labs Reviewed  GLUCOSE, CAPILLARY - Abnormal; Notable for the following components:      Result Value   Glucose-Capillary 148 (*)    All other components within normal limits     EKG 05/03/24:  SR with PACs Low voltage QRS   Echo 01/04/2016:  Study Conclusions  - Left ventricle: The cavity size was normal. Systolic function was   normal. The estimated ejection fraction was in the range of 60%   to 65%. Wall motion was normal; there were no regional wall   motion abnormalities. Left ventricular diastolic function   parameters were normal.  Past  Medical History:  Diagnosis Date   Arthritis    CVA (cerebral vascular accident) (HCC)    Diabetes mellitus without complication (HCC)    GERD (gastroesophageal reflux disease)    Hypertension    Left carpal tunnel syndrome 07/13/2017   OSA on CPAP    PONV (postoperative nausea and vomiting)    Reflux     Past Surgical History:  Procedure Laterality Date   ABDOMINAL HYSTERECTOMY     APPENDECTOMY     EP IMPLANTABLE DEVICE N/A 01/08/2016   Procedure: Loop Recorder Insertion;  Surgeon: Danelle LELON Birmingham, MD;  Location: [facility] INVASIVE CV LAB;  Service: Cardiovascular;  Laterality: N/A;   EYE SURGERY Bilateral    cataract extraction   IR GENERIC HISTORICAL  01/02/2016   IR ANGIO VERTEBRAL SEL VERTEBRAL UNI L MOD SED 01/02/2016 Thyra Nash, MD [facility]   IR GENERIC HISTORICAL  01/02/2016   IR ANGIO INTRA EXTRACRAN SEL COM CAROTID INNOMINATE BILAT MOD SED 01/02/2016 Thyra Nash, MD [facility]   IR GENERIC HISTORICAL  01/02/2016   IR ANGIO VERTEBRAL SEL SUBCLAVIAN INNOMINATE UNI R MOD SED 01/02/2016 Thyra Nash, MD [facility]   IVC FILTER INSERTION N/A 05/03/2024   Procedure: IVC FILTER INSERTION;  Surgeon: Rim Fonda BRAVO, MD;  Location: [facility] INVASIVE CV LAB;  Service: Cardiovascular;  Laterality: N/A;  KNEE ARTHROSCOPY WITH MEDIAL MENISECTOMY Left 02/18/2024   Procedure: ARTHROSCOPY, KNEE, WITH MEDIAL MENISCECTOMY;  Surgeon: Duwayne Purchase, MD;  Location: [facility];  Service: Orthopedics;  Laterality: Left;   left cyst from breast remove     RADIOLOGY WITH ANESTHESIA N/A 01/02/2016   Procedure: RADIOLOGY WITH ANESTHESIA;  Surgeon: Medication Radiologist, MD;  Location: [facility];  Service: Radiology;  Laterality: N/A;   TEE WITHOUT CARDIOVERSION N/A 01/08/2016   Procedure: TRANSESOPHAGEAL ECHOCARDIOGRAM (TEE);  Surgeon: Ezra GORMAN Shuck, MD;  Location: [facility] ENDOSCOPY;  Service: Cardiovascular;  Laterality: N/A;   TONSILLECTOMY      MEDICATIONS:  apixaban  (ELIQUIS ) 2.5 MG  TABS tablet   Ascorbic Acid  (VITAMIN C  GUMMIES PO)   atorvastatin  (LIPITOR) 40 MG tablet   Biotin w/ Vitamins C & E (HAIR SKIN & NAILS GUMMIES PO)   Blood Glucose Monitoring Suppl (ONETOUCH VERIO FLEX SYSTEM) w/Device KIT   Cholecalciferol  (VITAMIN D -3 PO)   cycloSPORINE  (RESTASIS ) 0.05 % ophthalmic emulsion   docusate sodium  (COLACE) 250 MG capsule   famotidine  (PEPCID ) 40 MG tablet   fexofenadine (ALLEGRA) 180 MG tablet   fluticasone  (FLONASE ) 50 MCG/ACT nasal spray   losartan  (COZAAR ) 25 MG tablet   MOUNJARO 12.5 MG/0.5ML Pen   Multiple Vitamin (MULTIVITAMIN WITH MINERALS) TABS tablet   NIFEdipine  (PROCARDIA -XL/NIFEDICAL-XL) 30 MG 24 hr tablet   omeprazole (PRILOSEC) 40 MG capsule   Probiotic Product (CULTURELLE PROBIOTIC + PREBIOT) CHEW   traMADol (ULTRAM) 50 MG tablet   valACYclovir  (VALTREX ) 500 MG tablet   vitamin E  180 MG (400 UNITS) capsule   No current facility-administered medications for this encounter.   Burnard CHRISTELLA Odis DEVONNA [facility] Short Stay/Anesthesiology [facility] Phone [phone] 05/09/2024 2:29 PM        "

## 2024-05-17 ENCOUNTER — Other Ambulatory Visit: Payer: Self-pay

## 2024-05-17 ENCOUNTER — Ambulatory Visit (HOSPITAL_COMMUNITY)

## 2024-05-17 ENCOUNTER — Encounter (HOSPITAL_COMMUNITY): Payer: Self-pay | Admitting: Specialist

## 2024-05-17 ENCOUNTER — Ambulatory Visit: Payer: Self-pay | Admitting: Orthopedic Surgery

## 2024-05-17 ENCOUNTER — Observation Stay (HOSPITAL_COMMUNITY)
Admission: RE | Admit: 2024-05-17 | Discharge: 2024-05-19 | Disposition: A | Discharge status: Home / Self Care | Attending: Specialist | Admitting: Specialist

## 2024-05-17 ENCOUNTER — Ambulatory Visit (HOSPITAL_COMMUNITY): Payer: Self-pay | Admitting: Medical

## 2024-05-17 ENCOUNTER — Ambulatory Visit (HOSPITAL_BASED_OUTPATIENT_CLINIC_OR_DEPARTMENT_OTHER): Payer: Self-pay | Admitting: Anesthesiology

## 2024-05-17 ENCOUNTER — Encounter (HOSPITAL_COMMUNITY)
Admission: RE | Disposition: A | Discharge status: Home / Self Care | Payer: Self-pay | Source: Home / Self Care | Attending: Specialist

## 2024-05-17 DIAGNOSIS — Z79899 Other long term (current) drug therapy: Secondary | ICD-10-CM | POA: Diagnosis not present

## 2024-05-17 DIAGNOSIS — M1712 Unilateral primary osteoarthritis, left knee: Secondary | ICD-10-CM | POA: Diagnosis present

## 2024-05-17 DIAGNOSIS — E1165 Type 2 diabetes mellitus with hyperglycemia: Secondary | ICD-10-CM | POA: Insufficient documentation

## 2024-05-17 DIAGNOSIS — E119 Type 2 diabetes mellitus without complications: Secondary | ICD-10-CM

## 2024-05-17 DIAGNOSIS — D6859 Other primary thrombophilia: Secondary | ICD-10-CM

## 2024-05-17 DIAGNOSIS — I10 Essential (primary) hypertension: Secondary | ICD-10-CM | POA: Diagnosis not present

## 2024-05-17 DIAGNOSIS — E785 Hyperlipidemia, unspecified: Secondary | ICD-10-CM | POA: Diagnosis not present

## 2024-05-17 DIAGNOSIS — F411 Generalized anxiety disorder: Secondary | ICD-10-CM

## 2024-05-17 DIAGNOSIS — Z8673 Personal history of transient ischemic attack (TIA), and cerebral infarction without residual deficits: Secondary | ICD-10-CM | POA: Diagnosis not present

## 2024-05-17 DIAGNOSIS — Z01818 Encounter for other preprocedural examination: Principal | ICD-10-CM

## 2024-05-17 LAB — GLUCOSE, CAPILLARY
Glucose-Capillary: 222 mg/dL — ABNORMAL HIGH (ref 70–99)
Glucose-Capillary: 193 mg/dL — ABNORMAL HIGH (ref 70–99)
Glucose-Capillary: 192 mg/dL — ABNORMAL HIGH (ref 70–99)
Glucose-Capillary: 199 mg/dL — ABNORMAL HIGH (ref 70–99)
Glucose-Capillary: 227 mg/dL — ABNORMAL HIGH (ref 70–99)
Glucose-Capillary: 284 mg/dL — ABNORMAL HIGH (ref 70–99)

## 2024-05-17 MED ORDER — SODIUM CHLORIDE 0.9% FLUSH
INTRAVENOUS | Status: DC | PRN
  Administered 2024-05-17: 40 mL

## 2024-05-17 MED ORDER — VALACYCLOVIR HCL 500 MG PO TABS
500.0000 mg | ORAL_TABLET | Freq: Every day | ORAL | Status: DC
  Administered 2024-05-18 – 2024-05-19 (×2): 500 mg via ORAL
  Filled 2024-05-17 (×2): qty 1

## 2024-05-17 MED ORDER — OXYCODONE HCL 5 MG/5ML PO SOLN: 5.0000 mg | Freq: Once | ORAL | Status: DC | PRN

## 2024-05-17 MED ORDER — ONDANSETRON HCL 4 MG/2ML IJ SOLN
INTRAMUSCULAR | Status: DC | PRN
  Administered 2024-05-17: 4 mg via INTRAVENOUS

## 2024-05-17 MED ORDER — ACETAMINOPHEN 10 MG/ML IV SOLN: 1000.0000 mg | INTRAVENOUS | Status: DC

## 2024-05-17 MED ORDER — HYDROMORPHONE HCL 1 MG/ML IJ SOLN
0.5000 mg | INTRAMUSCULAR | Status: DC | PRN
  Administered 2024-05-17: 1 mg via INTRAVENOUS
  Filled 2024-05-17: qty 1

## 2024-05-17 MED ORDER — DROPERIDOL 2.5 MG/ML IJ SOLN: 0.6250 mg | Freq: Once | INTRAMUSCULAR | Status: DC | PRN

## 2024-05-17 MED ORDER — LIDOCAINE HCL (CARDIAC) PF 100 MG/5ML IV SOSY
PREFILLED_SYRINGE | INTRAVENOUS | Status: DC | PRN
  Administered 2024-05-17: 100 mg via INTRAVENOUS

## 2024-05-17 MED ORDER — ONDANSETRON HCL 4 MG PO TABS
4.0000 mg | ORAL_TABLET | Freq: Four times a day (QID) | ORAL | Status: DC | PRN
  Administered 2024-05-18: 4 mg via ORAL
  Filled 2024-05-17: qty 1

## 2024-05-17 MED ORDER — PROPOFOL 10 MG/ML IV BOLUS
INTRAVENOUS | Status: AC
  Filled 2024-05-17: qty 20

## 2024-05-17 MED ORDER — OXYCODONE HCL 5 MG PO TABS: 10.0000 mg | ORAL_TABLET | ORAL | Status: DC | PRN

## 2024-05-17 MED ORDER — MENTHOL 3 MG MT LOZG: 1.0000 | LOZENGE | OROMUCOSAL | Status: DC | PRN

## 2024-05-17 MED ORDER — CELECOXIB 200 MG PO CAPS
200.0000 mg | ORAL_CAPSULE | Freq: Once | ORAL | Status: AC
  Administered 2024-05-17: 200 mg via ORAL
  Filled 2024-05-17: qty 1

## 2024-05-17 MED ORDER — BUPIVACAINE-EPINEPHRINE (PF) 0.25% -1:200000 IJ SOLN
INTRAMUSCULAR | Status: AC
  Filled 2024-05-17: qty 30

## 2024-05-17 MED ORDER — DOCUSATE SODIUM 100 MG PO CAPS
100.0000 mg | ORAL_CAPSULE | Freq: Two times a day (BID) | ORAL | Status: DC
  Administered 2024-05-17 – 2024-05-19 (×4): 100 mg via ORAL
  Filled 2024-05-17 (×4): qty 1

## 2024-05-17 MED ORDER — PANTOPRAZOLE SODIUM 40 MG PO TBEC
80.0000 mg | DELAYED_RELEASE_TABLET | Freq: Every day | ORAL | Status: DC
  Administered 2024-05-18 – 2024-05-19 (×2): 80 mg via ORAL
  Filled 2024-05-17 (×2): qty 2

## 2024-05-17 MED ORDER — NIFEDIPINE ER OSMOTIC RELEASE 30 MG PO TB24
30.0000 mg | ORAL_TABLET | Freq: Every day | ORAL | Status: DC
  Administered 2024-05-17 – 2024-05-18 (×2): 30 mg via ORAL
  Filled 2024-05-17 (×2): qty 1

## 2024-05-17 MED ORDER — VITAMIN D 25 MCG (1000 UNIT) PO TABS
1000.0000 [IU] | ORAL_TABLET | Freq: Every day | ORAL | Status: DC
  Administered 2024-05-18 – 2024-05-19 (×2): 1000 [IU] via ORAL
  Filled 2024-05-17 (×2): qty 1

## 2024-05-17 MED ORDER — TRAMADOL HCL 50 MG PO TABS: 50.0000 mg | ORAL_TABLET | Freq: Every day | ORAL | Status: DC | PRN

## 2024-05-17 MED ORDER — ACETAMINOPHEN 500 MG PO TABS
1000.0000 mg | ORAL_TABLET | Freq: Once | ORAL | Status: AC
  Administered 2024-05-17: 1000 mg via ORAL
  Filled 2024-05-17: qty 2

## 2024-05-17 MED ORDER — HYDROMORPHONE HCL 2 MG/ML IJ SOLN
INTRAMUSCULAR | Status: AC
  Filled 2024-05-17: qty 1

## 2024-05-17 MED ORDER — KCL IN DEXTROSE-NACL 20-5-0.9 MEQ/L-%-% IV SOLN
INTRAVENOUS | Status: DC
  Filled 2024-05-17: qty 1000

## 2024-05-17 MED ORDER — APIXABAN 2.5 MG PO TABS
2.5000 mg | ORAL_TABLET | Freq: Two times a day (BID) | ORAL | Status: DC
  Administered 2024-05-18 – 2024-05-19 (×3): 2.5 mg via ORAL
  Filled 2024-05-17 (×3): qty 1

## 2024-05-17 MED ORDER — ACETAMINOPHEN 500 MG PO TABS
1000.0000 mg | ORAL_TABLET | Freq: Four times a day (QID) | ORAL | Status: AC
  Administered 2024-05-17 – 2024-05-18 (×4): 1000 mg via ORAL
  Filled 2024-05-17 (×4): qty 2

## 2024-05-17 MED ORDER — FLUTICASONE PROPIONATE 50 MCG/ACT NA SUSP
2.0000 | Freq: Every day | NASAL | Status: DC
  Administered 2024-05-18 – 2024-05-19 (×2): 2 via NASAL
  Filled 2024-05-17: qty 16

## 2024-05-17 MED ORDER — BUPIVACAINE LIPOSOME 1.3 % IJ SUSP
INTRAMUSCULAR | Status: DC | PRN
  Administered 2024-05-17: 20 mL

## 2024-05-17 MED ORDER — METOCLOPRAMIDE HCL 5 MG PO TABS: 5.0000 mg | ORAL_TABLET | Freq: Three times a day (TID) | ORAL | Status: DC | PRN

## 2024-05-17 MED ORDER — HYDROMORPHONE HCL 2 MG PO TABS
2.0000 mg | ORAL_TABLET | ORAL | Status: DC | PRN
  Administered 2024-05-17 – 2024-05-19 (×7): 2 mg via ORAL
  Filled 2024-05-17 (×7): qty 1

## 2024-05-17 MED ORDER — SODIUM CHLORIDE (PF) 0.9 % IJ SOLN
INTRAMUSCULAR | Status: AC
  Filled 2024-05-17: qty 40

## 2024-05-17 MED ORDER — METHOCARBAMOL 1000 MG/10ML IJ SOLN: 500.0000 mg | Freq: Four times a day (QID) | INTRAMUSCULAR | Status: DC | PRN

## 2024-05-17 MED ORDER — SODIUM CHLORIDE 0.9 % IR SOLN
Status: DC | PRN
  Administered 2024-05-17: 3000 mL

## 2024-05-17 MED ORDER — INSULIN ASPART 100 UNIT/ML IJ SOLN
0.0000 [IU] | INTRAMUSCULAR | Status: AC | PRN
  Administered 2024-05-17: 2 [IU] via SUBCUTANEOUS
  Administered 2024-05-17: 3 [IU] via SUBCUTANEOUS
  Filled 2024-05-17: qty 3

## 2024-05-17 MED ORDER — DIPHENHYDRAMINE HCL 12.5 MG/5ML PO ELIX: 12.5000 mg | ORAL_SOLUTION | ORAL | Status: DC | PRN

## 2024-05-17 MED ORDER — ATORVASTATIN CALCIUM 40 MG PO TABS
40.0000 mg | ORAL_TABLET | Freq: Every day | ORAL | Status: DC
  Administered 2024-05-17 – 2024-05-18 (×2): 40 mg via ORAL
  Filled 2024-05-17 (×2): qty 1

## 2024-05-17 MED ORDER — TRANEXAMIC ACID-NACL 1000-0.7 MG/100ML-% IV SOLN
1000.0000 mg | INTRAVENOUS | Status: AC
  Administered 2024-05-17: 1000 mg via INTRAVENOUS
  Filled 2024-05-17: qty 100

## 2024-05-17 MED ORDER — MIDAZOLAM HCL (PF) 2 MG/2ML IJ SOLN: 0.5000 mg | INTRAMUSCULAR | Status: DC

## 2024-05-17 MED ORDER — POLYETHYLENE GLYCOL 3350 17 G PO PACK
17.0000 g | PACK | Freq: Every day | ORAL | Status: DC
  Administered 2024-05-18: 17 g via ORAL
  Filled 2024-05-17 (×2): qty 1

## 2024-05-17 MED ORDER — FENTANYL CITRATE (PF) 50 MCG/ML IJ SOSY: 25.0000 ug | PREFILLED_SYRINGE | INTRAMUSCULAR | Status: DC | PRN

## 2024-05-17 MED ORDER — LACTATED RINGERS IV SOLN: INTRAVENOUS | Status: DC | PRN

## 2024-05-17 MED ORDER — FENTANYL CITRATE (PF) 100 MCG/2ML IJ SOLN
INTRAMUSCULAR | Status: AC
  Filled 2024-05-17: qty 2

## 2024-05-17 MED ORDER — BUPIVACAINE LIPOSOME 1.3 % IJ SUSP
INTRAMUSCULAR | Status: AC
  Filled 2024-05-17: qty 20

## 2024-05-17 MED ORDER — FENTANYL CITRATE (PF) 100 MCG/2ML IJ SOLN
INTRAMUSCULAR | Status: DC | PRN
  Administered 2024-05-17: 100 ug via INTRAVENOUS
  Administered 2024-05-17 (×3): 50 ug via INTRAVENOUS

## 2024-05-17 MED ORDER — OXYCODONE HCL 5 MG PO TABS: 5.0000 mg | ORAL_TABLET | Freq: Once | ORAL | Status: DC | PRN

## 2024-05-17 MED ORDER — PROPOFOL 10 MG/ML IV BOLUS
INTRAVENOUS | Status: DC | PRN
  Administered 2024-05-17: 140 mg via INTRAVENOUS

## 2024-05-17 MED ORDER — FAMOTIDINE 20 MG PO TABS
40.0000 mg | ORAL_TABLET | Freq: Every day | ORAL | Status: DC
  Administered 2024-05-17 – 2024-05-18 (×2): 40 mg via ORAL
  Filled 2024-05-17 (×2): qty 2

## 2024-05-17 MED ORDER — ACETAMINOPHEN 10 MG/ML IV SOLN: 1000.0000 mg | Freq: Once | INTRAVENOUS | Status: DC | PRN

## 2024-05-17 MED ORDER — STERILE WATER FOR IRRIGATION IR SOLN
Status: DC | PRN
  Administered 2024-05-17: 2000 mL

## 2024-05-17 MED ORDER — METOCLOPRAMIDE HCL 5 MG/ML IJ SOLN
5.0000 mg | Freq: Three times a day (TID) | INTRAMUSCULAR | Status: DC | PRN
  Administered 2024-05-18: 5 mg via INTRAVENOUS
  Filled 2024-05-17: qty 2

## 2024-05-17 MED ORDER — FENTANYL CITRATE (PF) 50 MCG/ML IJ SOSY
25.0000 ug | PREFILLED_SYRINGE | INTRAMUSCULAR | Status: DC
  Administered 2024-05-17: 50 ug via INTRAVENOUS
  Filled 2024-05-17: qty 2

## 2024-05-17 MED ORDER — ALUM & MAG HYDROXIDE-SIMETH 200-200-20 MG/5ML PO SUSP: 30.0000 mL | ORAL | Status: DC | PRN

## 2024-05-17 MED ORDER — BUPIVACAINE HCL (PF) 0.25 % IJ SOLN
INTRAMUSCULAR | Status: DC | PRN
  Administered 2024-05-17: 20 mL via PERINEURAL

## 2024-05-17 MED ORDER — LORATADINE 10 MG PO TABS
10.0000 mg | ORAL_TABLET | Freq: Every day | ORAL | Status: DC
  Administered 2024-05-18 – 2024-05-19 (×2): 10 mg via ORAL
  Filled 2024-05-17 (×2): qty 1

## 2024-05-17 MED ORDER — BUPIVACAINE-EPINEPHRINE 0.25% -1:200000 IJ SOLN
INTRAMUSCULAR | Status: DC | PRN
  Administered 2024-05-17: 30 mL

## 2024-05-17 MED ORDER — ONDANSETRON HCL 4 MG/2ML IJ SOLN
4.0000 mg | Freq: Four times a day (QID) | INTRAMUSCULAR | Status: DC | PRN
  Administered 2024-05-17 – 2024-05-19 (×2): 4 mg via INTRAVENOUS
  Filled 2024-05-17 (×2): qty 2

## 2024-05-17 MED ORDER — DEXMEDETOMIDINE HCL IN NACL 80 MCG/20ML IV SOLN
INTRAVENOUS | Status: DC | PRN
  Administered 2024-05-17: 8 ug via INTRAVENOUS

## 2024-05-17 MED ORDER — VITAMIN C 500 MG PO TABS
500.0000 mg | ORAL_TABLET | Freq: Every day | ORAL | Status: DC
  Administered 2024-05-18 – 2024-05-19 (×2): 500 mg via ORAL
  Filled 2024-05-17 (×2): qty 1

## 2024-05-17 MED ORDER — PROPOFOL 500 MG/50ML IV EMUL
INTRAVENOUS | Status: DC | PRN
  Administered 2024-05-17: 125 ug/kg/min via INTRAVENOUS

## 2024-05-17 MED ORDER — CEFAZOLIN SODIUM-DEXTROSE 2-4 GM/100ML-% IV SOLN
2.0000 g | INTRAVENOUS | Status: AC
  Administered 2024-05-17: 2 g via INTRAVENOUS
  Filled 2024-05-17: qty 100

## 2024-05-17 MED ORDER — ORAL CARE MOUTH RINSE: 15.0000 mL | Freq: Once | OROMUCOSAL | Status: AC

## 2024-05-17 MED ORDER — HYDROMORPHONE HCL 1 MG/ML IJ SOLN
INTRAMUSCULAR | Status: DC | PRN
  Administered 2024-05-17: .5 mg via INTRAVENOUS
  Administered 2024-05-17 (×2): .2 mg via INTRAVENOUS
  Administered 2024-05-17: .5 mg via INTRAVENOUS

## 2024-05-17 MED ORDER — LOSARTAN POTASSIUM 25 MG PO TABS
25.0000 mg | ORAL_TABLET | Freq: Every day | ORAL | Status: DC
  Administered 2024-05-18 – 2024-05-19 (×2): 25 mg via ORAL
  Filled 2024-05-17 (×2): qty 1

## 2024-05-17 MED ORDER — INSULIN ASPART 100 UNIT/ML IJ SOLN
0.0000 [IU] | Freq: Three times a day (TID) | INTRAMUSCULAR | Status: DC
  Administered 2024-05-17: 5 [IU] via SUBCUTANEOUS
  Administered 2024-05-18: 8 [IU] via SUBCUTANEOUS
  Administered 2024-05-18: 11 [IU] via SUBCUTANEOUS
  Administered 2024-05-18: 15 [IU] via SUBCUTANEOUS
  Administered 2024-05-19 (×2): 8 [IU] via SUBCUTANEOUS
  Filled 2024-05-17: qty 8
  Filled 2024-05-17: qty 5
  Filled 2024-05-17: qty 15
  Filled 2024-05-17 (×2): qty 8
  Filled 2024-05-17: qty 11

## 2024-05-17 MED ORDER — BIOTIN W/ VITAMINS C & E 1250-7.5-3.375 MCG-MG-MG PO CHEW: CHEWABLE_TABLET | Freq: Every morning | ORAL | Status: DC

## 2024-05-17 MED ORDER — RISAQUAD PO CAPS
1.0000 | ORAL_CAPSULE | Freq: Every day | ORAL | Status: DC
  Administered 2024-05-18 – 2024-05-19 (×2): 1 via ORAL
  Filled 2024-05-17 (×2): qty 1

## 2024-05-17 MED ORDER — OXYCODONE HCL 5 MG PO TABS: 5.0000 mg | ORAL_TABLET | ORAL | Status: DC | PRN

## 2024-05-17 MED ORDER — ROCURONIUM BROMIDE 100 MG/10ML IV SOLN
INTRAVENOUS | Status: DC | PRN
  Administered 2024-05-17 (×2): 10 mg via INTRAVENOUS
  Administered 2024-05-17: 60 mg via INTRAVENOUS

## 2024-05-17 MED ORDER — MAGNESIUM CITRATE PO SOLN: 1.0000 | Freq: Once | ORAL | Status: DC | PRN

## 2024-05-17 MED ORDER — PHENOL 1.4 % MT LIQD: 1.0000 | OROMUCOSAL | Status: DC | PRN

## 2024-05-17 MED ORDER — CYCLOSPORINE 0.05 % OP EMUL
1.0000 [drp] | Freq: Two times a day (BID) | OPHTHALMIC | Status: DC
  Administered 2024-05-17 – 2024-05-19 (×4): 1 [drp] via OPHTHALMIC
  Filled 2024-05-17 (×4): qty 30

## 2024-05-17 MED ORDER — CHLORHEXIDINE GLUCONATE 0.12 % MT SOLN
15.0000 mL | Freq: Once | OROMUCOSAL | Status: AC
  Administered 2024-05-17: 15 mL via OROMUCOSAL

## 2024-05-17 MED ORDER — CEFAZOLIN SODIUM-DEXTROSE 2-4 GM/100ML-% IV SOLN
2.0000 g | Freq: Four times a day (QID) | INTRAVENOUS | Status: AC
  Administered 2024-05-17 (×2): 2 g via INTRAVENOUS
  Filled 2024-05-17 (×2): qty 100

## 2024-05-17 MED ORDER — LACTATED RINGERS IV SOLN: INTRAVENOUS | Status: DC

## 2024-05-17 MED ORDER — 0.9 % SODIUM CHLORIDE (POUR BTL) OPTIME
TOPICAL | Status: DC | PRN
  Administered 2024-05-17: 1000 mL

## 2024-05-17 MED ORDER — BISACODYL 5 MG PO TBEC: 5.0000 mg | DELAYED_RELEASE_TABLET | Freq: Every day | ORAL | Status: DC | PRN

## 2024-05-17 MED ORDER — METHOCARBAMOL 500 MG PO TABS
500.0000 mg | ORAL_TABLET | Freq: Four times a day (QID) | ORAL | Status: DC | PRN
  Administered 2024-05-17 – 2024-05-19 (×6): 500 mg via ORAL
  Filled 2024-05-17 (×6): qty 1

## 2024-05-17 NOTE — Anesthesia Procedure Notes (Signed)
 Anesthesia Regional Block: Adductor canal block   Pre-Anesthetic Checklist: , timeout performed,  Correct Patient, Correct Site, Correct Laterality,  Correct Procedure, Correct Position, site marked,  Risks and benefits discussed,  Surgical consent,  Pre-op evaluation,  At surgeon's request and post-op pain management  Laterality: Lower and Left  Prep: chloraprep       Needles:  Injection technique: Single-shot  Needle Type: Echogenic Needle     Needle Length: 9cm  Needle Gauge: 21     Additional Needles:   Procedures:,,,, ultrasound used (permanent image in chart),,    Narrative:  Start time: 05/17/2024 10:34 AM End time: 05/17/2024 10:36 AM Injection made incrementally with aspirations every 5 mL.  Performed by: Personally  Anesthesiologist: Boone Fess, MD  Additional Notes: Patient's chart reviewed and they were deemed appropriate candidate for procedure, per surgeon's request. Patient educated about risks, benefits, and alternatives of the block including but not limited to: temporary or permanent nerve damage, bleeding, infection, damage to surround tissues, block failure, local anesthetic toxicity. Patient expressed understanding. A formal time-out was conducted consistent with institution rules.  Monitors were applied, and minimal sedation used (see nursing record). The site was prepped with skin prep and allowed to dry, and sterile gloves were used. A high frequency linear ultrasound probe with probe cover was utilized throughout. Femoral artery visualized at mid-thigh level, local anesthetic injected anterolateral to it, and echogenic block needle trajectory was monitored throughout. Hydrodissection of saphenous nerve visualized and appeared anatomically normal. Aspiration performed every 5ml. Blood vessels were avoided. All injections were performed without resistance and free of blood and paresthesias. The patient tolerated the procedure well.  Injectate: 20ml 0.25%  bupivacaine 

## 2024-05-17 NOTE — Anesthesia Preprocedure Evaluation (Signed)
"                                    Anesthesia Evaluation  Patient identified by MRN, date of birth, ID band Patient awake    Reviewed: Allergy & Precautions, NPO status , Patient's Chart, lab work & pertinent test results  History of Anesthesia Complications (+) PONV and history of anesthetic complications  Airway Mallampati: II  TM Distance: >3 FB Neck ROM: Full    Dental no notable dental hx. (+) Teeth Intact   Pulmonary sleep apnea and Continuous Positive Airway Pressure Ventilation , neg COPD, Patient abstained from smoking.Not current smoker   Pulmonary exam normal breath sounds clear to auscultation       Cardiovascular Exercise Tolerance: Good METShypertension, Pt. on medications (-) CAD and (-) Past MI (-) dysrhythmias  Rhythm:Regular Rate:Normal - Systolic murmurs    Neuro/Psych  PSYCHIATRIC DISORDERS Anxiety     Residual word finding difficulty CVA, Residual Symptoms    GI/Hepatic ,GERD  Controlled and Medicated,,(+)     (-) substance abuse    Endo/Other  diabetes, Well Controlled  GLP1ra last taken > 7 days ago. Denies GI symptoms today  Renal/GU negative Renal ROS     Musculoskeletal  (+) Arthritis ,    Abdominal   Peds  Hematology Last dose eliquis  was Sunday PM (~64hrs ago)   Anesthesia Other Findings Past Medical History: No date: Arthritis No date: CVA (cerebral vascular accident) (HCC) No date: Diabetes mellitus without complication (HCC) No date: GERD (gastroesophageal reflux disease) No date: Hypertension 07/13/2017: Left carpal tunnel syndrome No date: OSA on CPAP No date: PONV (postoperative nausea and vomiting) No date: Reflux  Reproductive/Obstetrics                              Anesthesia Physical Anesthesia Plan  ASA: 3  Anesthesia Plan: General   Post-op Pain Management: Tylenol  PO (pre-op)* and Regional block*   Induction: Intravenous  PONV Risk Score and Plan: 4 or greater and  Ondansetron , Dexamethasone , Treatment may vary due to age or medical condition, TIVA and Propofol  infusion  Airway Management Planned: Oral ETT  Additional Equipment: None  Intra-op Plan:   Post-operative Plan: Extubation in OR  Informed Consent: I have reviewed the patients History and Physical, chart, labs and discussed the procedure including the risks, benefits and alternatives for the proposed anesthesia with the patient or authorized representative who has indicated his/her understanding and acceptance.     Dental advisory given  Plan Discussed with: CRNA and Surgeon  Anesthesia Plan Comments: (Last dose eliquis  was Sunday PM (~64hrs ago), which does not fall in line with the >72hrs ASRA recommendation to allow for safe neuraxial anesthesia.  Discussed risks of anesthesia with patient, including PONV, sore throat, lip/dental/eye damage. Rare risks discussed as well, such as cardiorespiratory and neurological sequelae, and allergic reactions. Discussed the role of CRNA in patient's perioperative care. Patient understands.  Discussed r/b/a of adductor canal nerve block, including:  - bleeding, infection, nerve damage - poor or non functioning block. - reactions and toxicity to local anesthetic Patient understands. )         Anesthesia Quick Evaluation  "

## 2024-05-17 NOTE — Anesthesia Postprocedure Evaluation (Signed)
"   Anesthesia Post Note  Patient: Caroline [name]  Procedure(s) Performed: ARTHROPLASTY, KNEE, TOTAL (Left: Knee)     Patient location during evaluation: PACU Anesthesia Type: General Level of consciousness: awake and alert Pain management: pain level controlled Vital Signs Assessment: post-procedure vital signs reviewed and stable Respiratory status: spontaneous breathing, nonlabored ventilation, respiratory function stable and patient connected to nasal cannula oxygen Cardiovascular status: blood pressure returned to baseline and stable Postop Assessment: no apparent nausea or vomiting Anesthetic complications: no   No notable events documented.  Last Vitals:  Vitals:   05/17/24 1422 05/17/24 1430  BP: 122/77 122/79  Pulse: 89 79  Resp: 20 11  Temp:    SpO2: 100% 94%    Last Pain:  Vitals:   05/17/24 1430  TempSrc:   PainSc: 0-No pain                 Rome Ade      "

## 2024-05-17 NOTE — Transfer of Care (Signed)
 Immediate Anesthesia Transfer of Care Note  Patient: Caroline [name]  Procedure(s) Performed: ARTHROPLASTY, KNEE, TOTAL (Left: Knee)  Patient Location: PACU  Anesthesia Type:GA combined with regional for post-op pain  Level of Consciousness: awake and alert   Airway & Oxygen Therapy: Patient Spontanous Breathing and Patient connected to nasal cannula oxygen  Post-op Assessment: Report given to RN and Post -op Vital signs reviewed and stable  Post vital signs: Reviewed and stable  Last Vitals:  Vitals Value Taken Time  BP    Temp    Pulse    Resp    SpO2      Last Pain:  Vitals:   05/17/24 1050  TempSrc:   PainSc: 0-No pain         Complications: No notable events documented.

## 2024-05-17 NOTE — Op Note (Unsigned)
 NAME: Caroline [name], Caroline [name] MEDICAL RECORD NO: [id number] ACCOUNT NO: [id number] DATE OF BIRTH: [birth year] FACILITY: [facility] LOCATION: [facility] PHYSICIAN: Reyes KYM Billing, MD  Operative Report   DATE OF PROCEDURE: 05/17/2024  PREOPERATIVE DIAGNOSIS:  End-stage osteoarthrosis, varus deformity, left knee.  POSTOPERATIVE DIAGNOSIS:  End-stage osteoarthrosis, varus deformity, left knee.  PROCEDURE PERFORMED:  Left total knee arthroplasty utilizing Attune rotating platform, 5 femur, 4 tibia, 8 mm insert, 32 patella.  ANESTHESIA:  General.  ASSISTANT:  Jaclyn Bissell, PA.  HISTORY:  A 69 year old with end-stage osteoarthrosis, left knee varus deformity, refractory to conservative treatment, indicated for replacement of the degenerate joint.  Risks and benefits were discussed, including bleeding, infection, damage to  neurovascular structures, no change in symptoms or worsening of symptoms, DVT, PE, and anesthetic complications, etc.  DESCRIPTION OF PROCEDURE:  With the patient in the supine position, after induction of adequate general anesthesia, 2 g of Kefzol  the left lower extremity was prepped, draped, and exsanguinated in usual sterile fashion.  A tourniquet inflated to 225  mmHg.  A midline incision was made over the knee.  Full-thickness flaps were developed.  A medial parapatellar arthrotomy was performed.  The patella was gently everted.  We elevated soft tissue medially, preserving the MCL.  The patella was gently  everted.  The knee was flexed.  Bone-on-bone arthrosis of the medial compartment and patellofemoral joint was noted.  The fat pad was debrided.  Remnants of the medial and lateral menisci were excised, as was the ACL.  A notch was placed above the  femoral notch as a starting hole for the femoral drill, which was drilled in line with the femur, entering without difficulty.  It was then irrigated and confirmed with a T-handle intramedullary guide, 5-degree left, 8 off the distal  femur due to the  slight recurvatum.  This was pinned to perform the distal femoral cut.  We sized the femur to be a 5.  This was pinned in 3 degrees of external rotation, which was coplanar with the transepicondylar axis.  This was then pinned, and we placed our block  cut and performed the anterior-posterior chamfer cuts without notching the anterior surface.  The lateral femoral condyle was somewhat hypoplastic.  I then subluxed the tibia.  The low side was medially 2 off the defect.  An external alignment guide was  parallel to the shaft with a 3-degree slope, bisecting the tibiotalar joint, which was 8 off the lateral side.  This was pinned.  I performed the cut and then tried our extension block, which was slightly recurvatum at 6.  I then re-flexed the knee,  subluxed the tibia, and sized it to a 4, just to the medial third of the tibial tubercle, maximizing coverage, harvesting bone, and impacting it in the distal femur.  Suboptimal bone quality was noted.  I then drilled centrally using our punch guide.   Attention was turned back to the femur with our box cut guide bisecting the canal.  This was pinned.  We performed our box cut, placed a trial femur, it set flush, and drilled our lug holes.  I then placed an 8 insert and reduced it.  I had full  extension, full flexion, good stability to varus-valgus stress in 0 and 30 degrees, negative anterior drawer, and slight lateral tracking of the patella.  A lateral release was performed, preserving the synovium.  The patella was everted.  The patient  had an interesting quadriceps attachment, a more hypertrophic medial attachment.  I measured it to a 22.5, and planed it to a 14, utilizing the patellar jig.  A portion superior-medially was beveled to not disrupt the quadriceps insertion.  There was  some incongruity of the patella noted.  I then used the paddle 32 parallel to the joint space, drilling our peg holes, placing the patella, reducing it, and  had excellent patellofemoral tracking.  All instrumentation was removed.  We checked posteriorly.   The popliteus capsule is intact.  We protected the soft tissues posteriorly, medially, and laterally throughout the case and throughout the bony cuts.  Bone was impacted into the distal femur.  We used Exparel  through the posterior medial capsule,  aspirating first to avoid a break in the vacuum and injecting 20 mL, anesthetizing the medial-lateral gutter, the periosteum of the femur, proximal tibia, quadriceps tendon, etc.  Copiously irrigated with pulsatile lavage.  Cement was mixed on back table  under vacuum and centrifuge.  The knee was flexed.  All surfaces were thoroughly dried.  We used a drill to drill eburnated bone in the medial tibial plateau.  Cement was placed in the proximal tibia, digitally pressurizing it, and cement was placed on  the tibial component and impacted into place.  Cement was placed on the femoral component on the femur and impacted into place.  With an 8 mm insert, I reduced the knee and in full extension held an axial load throughout the curing of the cement and  removed redundant cement.  Cemented and clamped the patellar.  Marcaine  with epinephrine  was placed in the wound, followed by Prontosan.  It was covered during the curing of the cement.  The tourniquet was deflated at 65 minutes.  Any bleeding was  cauterized with Aquamantys.  The wound was copiously irrigated.  In mid flexion, we reapproximated the patellar arthrotomy with 1 Vicryl interrupted figure-of-eight sutures, oversewn with a running Stratafix.  We had excellent patellofemoral tracking  following that and stability, full extension, and full flexion.  Then, the wound was copiously irrigated.  Just prior to closure, we had removed the trial, meticulously removed all redundant cement, had the wound copiously irrigated with pulsatile lavage  and Prontosan, and placed an 8 mm insert, reduced it, and had good  stability.  We reapproximated the patellar arthrotomy with 1 Vicryl interrupted figure-of-eight sutures, oversewn with a running Stratafix.  We had excellent patellofemoral tracking and  stability following this.  The wound was copiously irrigated once again with Prontosan.  The subcu was closed with 2-0 and the skin with staples.  The wound was dressed sterilely and placed in an immobilizer.  The patient was extubated and transported to  the recovery room in satisfactory condition.  The patient tolerated the procedure well.  There were no complications.  Assistant, Jaclyn Bissell, [place], was used throughout the case for patient positioning, closure, and exposure.  BLOOD LOSS:  50 mL.   PUS D: 05/17/2024 1:20:11 pm T: 05/17/2024 1:33:00 pm  JOB: [id number]/ [id number]

## 2024-05-17 NOTE — Anesthesia Procedure Notes (Signed)
 Procedure Name: Intubation Date/Time: 05/17/2024 11:04 AM  Performed by: Dartha Meckel, CRNAPre-anesthesia Checklist: Patient identified, Emergency Drugs available, Suction available and Patient being monitored Patient Re-evaluated:Patient Re-evaluated prior to induction Oxygen Delivery Method: Circle system utilized Preoxygenation: Pre-oxygenation with 100% oxygen Induction Type: IV induction Ventilation: Mask ventilation without difficulty Laryngoscope Size: Mac and 3 Grade View: Grade I Tube type: Oral Tube size: 6.5 mm Number of attempts: 1 Airway Equipment and Method: Stylet and Oral airway Placement Confirmation: ETT inserted through vocal cords under direct vision, positive ETCO2 and breath sounds checked- equal and bilateral Secured at: 20 cm Tube secured with: Tape Dental Injury: Teeth and Oropharynx as per pre-operative assessment

## 2024-05-17 NOTE — H&P (Signed)
 Caroline [name] is an 69 y.o. female.   Chief Complaint: left knee pain HPI: The patient is going to have a left total knee arthroplasty performed by Dr. Reyes Billing on 05/17/2024 at [facility]. Please see electronic hospital medical record for complete details.   Dr. Billing and the patient mutually agreed to proceed with a total knee replacement. Risks and benefits of the procedure were discussed including stiffness, suboptimal range of motion, persistent pain, infection requiring removal of prosthesis and reinsertion, need for prophylactic antibiotics in the future, for example, dental procedures, possible need for manipulation, revision in the future and also anesthetic complications including DVT, PE, etc. We discussed the perioperative course, time in the hospital, postoperative recovery and the need for elevation to control swelling. We also discussed the predicted range of motion and the probability that squatting and kneeling would be unobtainable in the future. In addition, postoperative anticoagulation was discussed. We have obtained preoperative medical clearance as necessary. Provided illustrated handout and discussed it in detail. They will enroll in the total joint replacement educational forum at the hospital.  Scheduled for IVC filter 1/14. Has been told to hold Monjauro and Eliquis  appropriately. Does not tolerate OXy well. HAs ice machine, walker, cane. Would like to do outpt PT in [place] where she went previously after HHPT is done.  Past Medical History:  Diagnosis Date   Arthritis    CVA (cerebral vascular accident) (HCC)    Diabetes mellitus without complication (HCC)    GERD (gastroesophageal reflux disease)    Hypertension    Left carpal tunnel syndrome 07/13/2017   OSA on CPAP    PONV (postoperative nausea and vomiting)    Reflux     Past Surgical History:  Procedure Laterality Date   ABDOMINAL HYSTERECTOMY     APPENDECTOMY     EP IMPLANTABLE DEVICE N/A  01/08/2016   Procedure: Loop Recorder Insertion;  Surgeon: Danelle LELON Birmingham, MD;  Location: [facility] INVASIVE CV LAB;  Service: Cardiovascular;  Laterality: N/A;   EYE SURGERY Bilateral    cataract extraction   IR GENERIC HISTORICAL  01/02/2016   IR ANGIO VERTEBRAL SEL VERTEBRAL UNI L MOD SED 01/02/2016 Thyra Nash, MD [facility]   IR GENERIC HISTORICAL  01/02/2016   IR ANGIO INTRA EXTRACRAN SEL COM CAROTID INNOMINATE BILAT MOD SED 01/02/2016 Thyra Nash, MD [facility]   IR GENERIC HISTORICAL  01/02/2016   IR ANGIO VERTEBRAL SEL SUBCLAVIAN INNOMINATE UNI R MOD SED 01/02/2016 Thyra Nash, MD [facility]   IVC FILTER INSERTION N/A 05/03/2024   Procedure: IVC FILTER INSERTION;  Surgeon: Lanis Fonda BRAVO, MD;  [facility]: [facility] INVASIVE CV LAB;  Service: Cardiovascular;  Laterality: N/A;   KNEE ARTHROSCOPY WITH MEDIAL MENISECTOMY Left 02/18/2024   Procedure: ARTHROSCOPY, KNEE, WITH MEDIAL MENISCECTOMY;  Surgeon: Billing Reyes, MD;  Location: [facility] ORS;  Service: Orthopedics;  Laterality: Left;   left cyst from breast remove     RADIOLOGY WITH ANESTHESIA N/A 01/02/2016   Procedure: RADIOLOGY WITH ANESTHESIA;  Surgeon: Medication Radiologist, MD;  Location: [facility] OR;  Service: Radiology;  Laterality: N/A;   TEE WITHOUT CARDIOVERSION N/A 01/08/2016   Procedure: TRANSESOPHAGEAL ECHOCARDIOGRAM (TEE);  Surgeon: Ezra GORMAN Shuck, MD;  Location: [facility] ENDOSCOPY;  Service: Cardiovascular;  Laterality: N/A;   TONSILLECTOMY      Family History  Problem Relation Age of Onset   Atrial fibrillation Mother    Suicidality Father        1971   Stroke Maternal Grandfather  Social History:  reports that she has never smoked. She has never used smokeless tobacco. She reports that she does not drink alcohol and does not use drugs.  Allergies: Allergies[1]  (Not in a hospital admission)   Results for orders placed or performed during the hospital encounter of 05/17/24 (from the past 48 hours)  Glucose,  capillary     Status: Abnormal   Collection Time: 05/17/24  8:24 AM  Result Value Ref Range   Glucose-Capillary 222 (H) 70 - 99 mg/dL    Comment: Glucose reference range applies only to samples taken after fasting for at least 8 hours.   Comment 1 Notify RN    No results found.  Review of Systems  Constitutional: Negative.   HENT: Negative.    Eyes: Negative.   Respiratory: Negative.    Cardiovascular: Negative.   Gastrointestinal: Negative.   Endocrine: Negative.   Genitourinary: Negative.   Musculoskeletal: Negative.   Neurological: Negative.   Hematological: Negative.   Psychiatric/Behavioral: Negative.      There were no vitals taken for this visit. Physical Exam Constitutional:      Appearance: Normal appearance.  HENT:     Head: Normocephalic and atraumatic.     Right Ear: External ear normal.     Nose: Nose normal.     Mouth/Throat:     Pharynx: Oropharynx is clear.  Eyes:     Pupils: Pupils are equal, round, and reactive to light.  Cardiovascular:     Rate and Rhythm: Normal rate.     Pulses: Normal pulses.  Pulmonary:     Effort: Pulmonary effort is normal.  Abdominal:     General: Abdomen is flat.  Musculoskeletal:     Cervical back: Normal range of motion.     Comments: Exquisitely tender palpation medial joint line posterior medial joint line as patellofemoral pain compression no evidence of infection.  Neurological:     Mental Status: She is alert.    MRI demonstrates complex tear of the medial meniscus now with a complete tear of the root of the medial meniscus. Subchondral edema is noted in the tibial plateau as well as a small Baker's cyst. Extruded meniscus. X-rays demonstrate moderately severe medial joint space narrowing  Assessment/Plan Impression: L knee end-stage DJD  Plan: Pt with end-stage left knee DJD, bone-on-bone, refractory to conservative tx, scheduled for left total knee replacement by Dr. Duwayne on 05/17/24. We again discussed the  procedure itself as well as risks, complications and alternatives, including but not limited to DVT, PE, infx, bleeding, failure of procedure, need for secondary procedure including manipulation, nerve injury, ongoing pain/symptoms, anesthesia risk, even stroke or death. Also discussed typical post-op protocols, activity restrictions, need for PT, flexion/extension exercises, time out of work. Discussed need for DVT ppx post-op per protocol. Discussed dental ppx and infx prevention. Also discussed limitations post-operatively such as kneeling and squatting. All questions were answered. Patient desires to proceed with surgery as scheduled.  Will hold supplements, Monjauro, Eliquis  accordingly. Will remain NPO after midnight the night before surgery. [facility] present to [facility] for pre-op testing. Anticipate hospital stay to include at least 2 midnights given medical history and to ensure proper pain control. Plan resume Eliquis , will have IVC filter also for DVT ppx post-op. Plan PO dilaudid , Robaxin , Colace, Miralax . Plan home with HHPT post-op with family members at home for assistance, order placed with [facility], then outpt PT at [facility] in [place], order placed. Will follow up 10-14 days post-op for staple removal  and xrays.  Plan left total knee replacement  Darice CHRISTELLA Randy, PA-C for Dr Duwayne 05/17/2024, 10:19 AM       [1]  Allergies Allergen Reactions   Amoxicillin Rash   Azithromycin Rash   Homatropine Rash   Hydrocodone  Bit-Homatrop Mbr Rash   Penicillin G Rash    Dyspepsia, ingestion   Tape Rash

## 2024-05-17 NOTE — Brief Op Note (Signed)
 05/17/2024 10:21 AM  10:19 AM  PATIENT:  Arland JULIANNA Louder  69 y.o. female  PRE-OPERATIVE DIAGNOSIS:  degnerative joint disease  POST-OPERATIVE DIAGNOSIS:  * No post-op diagnosis entered *  PROCEDURE:  Procedures: ARTHROPLASTY, KNEE, TOTAL (Left) The aquamantis was utilized for this case to help facilitate better hemostasis as patient was felt to be at increased risk of bleeding because of recent anticoagulation use.   SURGEON:  Surgeons and Role:    * Duwayne Purchase, MD - Primary  PHYSICIAN ASSISTANT:   ASSISTANTS: Bissell   ANESTHESIA:   spinal  EBL:  50  BLOOD ADMINISTERED:none  DRAINS: none   LOCAL MEDICATIONS USED:  MARCAINE      SPECIMEN:  No Specimen  DISPOSITION OF SPECIMEN:  N/A  COUNTS:  YES  TOURNIQUET:  65 min DICTATION: .Other Dictation: Dictation Number [id number]  PLAN OF CARE: Admit for overnight observation  PATIENT DISPOSITION:  PACU - hemodynamically stable.   Delay start of Pharmacological VTE agent (>24hrs) due to surgical blood loss or risk of bleeding: no

## 2024-05-17 NOTE — Evaluation (Signed)
 Physical Therapy Evaluation Patient Details Name: Caroline [name] MRN: [id number] DOB: May 10, 1955 Today's Date: 05/17/2024  History of Present Illness  Pt is a 69 yo female presenting s/p L TKA on 05/17/2024 due to failure of conservative measures. Pt PMH includes but is not limited to: Hx of CVA, DM, GERD, HTN, OSA on CPAP, hx of DVT, IVC filter (04/2024) and L knee arthroscopy, partial medial and lateral meniscectomy, chrondroplasty (02/18/2024).  Clinical Impression    JERRIANNE HARTIN is a 69 y.o. female POD 0 s/p L TKA. Patient reports mod I with mobility at baseline. Patient is now limited by functional impairments (see PT problem list below) and requires mod A for supine to sit and max A for sit to supine for bed mobility and unable to progress with  transfers and gait tasks due to N and V as well as elevated pain response from 6/10-10/10. Patient will benefit from continued skilled PT interventions to address impairments and progress towards PLOF. Acute PT will follow to progress mobility and stair training in preparation for safe discharge home with family support and OPPT services.       If plan is discharge home, recommend the following: Two people to help with walking and/or transfers;Two people to help with bathing/dressing/bathroom;Assistance with cooking/housework;Assist for transportation   Can travel by private vehicle        Equipment Recommendations None recommended by PT  Recommendations for Other Services       Functional Status Assessment Patient has had a recent decline in their functional status and demonstrates the ability to make significant improvements in function in a reasonable and predictable amount of time.     Precautions / Restrictions Precautions Precautions: Fall;Knee Restrictions Weight Bearing Restrictions Per Provider Order: Yes LLE Weight Bearing Per Provider Order: Weight bearing as tolerated      Mobility  Bed Mobility Overal bed mobility:  Needs Assistance Bed Mobility: Supine to Sit, Sit to Supine, Rolling Rolling: Min assist, Used rails   Supine to sit: Mod assist, HOB elevated, Used rails Sit to supine: Max assist, +2 for physical assistance, +2 for safety/equipment   General bed mobility comments: pt required increased time and multimodal cues with use of hospital bed features for supine to sit, return to bed flat surface with max A x 2 secondary to N and V as well as pain    Transfers                   General transfer comment: NT    Ambulation/Gait               General Gait Details: NT  Stairs            Wheelchair Mobility     Tilt Bed    Modified Rankin (Stroke Patients Only)       Balance Overall balance assessment: Needs assistance Sitting-balance support: Feet supported Sitting balance-Leahy Scale: Poor Sitting balance - Comments: occational LOB with min to mod A to recover attributed to decreased alertness attributed to medications                                     Pertinent Vitals/Pain Pain Assessment Pain Assessment: 0-10 Pain Score: 8  Pain Location: L knee and L E Pain Descriptors / Indicators: Aching, Constant, Discomfort, Dull, Grimacing, Operative site guarding Pain Intervention(s): Limited activity within patient's tolerance, Monitored during session, Premedicated  before session, Repositioned, Ice applied    Home Living Family/patient expects to be discharged to:: Private residence Living Arrangements: Spouse/significant other Available Help at Discharge: Family;Available 24 hours/day Type of Home: House Home Access: Level entry       Home Layout: One level Home Equipment: Agricultural Consultant (2 wheels)      Prior Function Prior Level of Function : Independent/Modified Independent             Mobility Comments: mod I with use of RW in home and SPC in community, mod I for all ADLs and self care       Extremity/Trunk Assessment         Lower Extremity Assessment Lower Extremity Assessment: LLE deficits/detail LLE Deficits / Details: ankle DF/PF 5/5; AA for SLR LLE Sensation: WNL    Cervical / Trunk Assessment Cervical / Trunk Assessment: Normal  Communication   Communication Communication: No apparent difficulties    Cognition Arousal: Lethargic, Suspect due to medications Behavior During Therapy: Flat affect   PT - Cognitive impairments: No apparent impairments                       PT - Cognition Comments: PT arrived s/p IV pain medication and mm relaxer Following commands: Intact       Cueing       General Comments      Exercises     Assessment/Plan    PT Assessment Patient needs continued PT services  PT Problem List Decreased strength;Decreased range of motion;Decreased activity tolerance;Decreased balance;Decreased mobility;Decreased coordination;Pain       PT Treatment Interventions DME instruction;Gait training;Functional mobility training;Therapeutic activities;Therapeutic exercise;Balance training;Neuromuscular re-education;Patient/family education;Modalities    PT Goals (Current goals can be found in the Care Plan section)  Acute Rehab PT Goals Patient Stated Goal: to do better PT Goal Formulation: With patient Time For Goal Achievement: 05/31/24 Potential to Achieve Goals: Good    Frequency 7X/week     Co-evaluation               AM-PAC PT 6 Clicks Mobility  Outcome Measure Help needed turning from your back to your side while in a flat bed without using bedrails?: A Little Help needed moving from lying on your back to sitting on the side of a flat bed without using bedrails?: A Lot Help needed moving to and from a bed to a chair (including a wheelchair)?: Total Help needed standing up from a chair using your arms (e.g., wheelchair or bedside chair)?: Total Help needed to walk in hospital room?: Total Help needed climbing 3-5 steps with a railing? :  Total 6 Click Score: 9    End of Session   Activity Tolerance: Patient limited by pain;Patient limited by lethargy (N and V) Patient left: in bed;with call bell/phone within reach;with family/visitor present Nurse Communication: Mobility status;Other (comment) (N and V) PT Visit Diagnosis: Unsteadiness on feet (R26.81);Other abnormalities of gait and mobility (R26.89);Muscle weakness (generalized) (M62.81);Difficulty in walking, not elsewhere classified (R26.2);Pain Pain - Right/Left: Left Pain - part of body: Knee;Leg    Time: 1756-1820 PT Time Calculation (min) (ACUTE ONLY): 24 min   Charges:   PT Evaluation $PT Eval Low Complexity: 1 Low PT Treatments $Therapeutic Activity: 8-22 mins PT General Charges $$ ACUTE PT VISIT: 1 Visit         [place], PT Acute Rehab   [place] VEAR Drone 05/17/2024, 6:33 PM

## 2024-05-18 ENCOUNTER — Encounter (HOSPITAL_COMMUNITY): Payer: Self-pay | Admitting: Specialist

## 2024-05-18 DIAGNOSIS — E119 Type 2 diabetes mellitus without complications: Secondary | ICD-10-CM

## 2024-05-18 DIAGNOSIS — Z794 Long term (current) use of insulin: Secondary | ICD-10-CM

## 2024-05-18 DIAGNOSIS — M1712 Unilateral primary osteoarthritis, left knee: Secondary | ICD-10-CM | POA: Diagnosis not present

## 2024-05-18 LAB — HEMOGLOBIN A1C
Hgb A1c MFr Bld: 6.7 % — ABNORMAL HIGH (ref 4.8–5.6)
Mean Plasma Glucose: 145.59 mg/dL

## 2024-05-18 LAB — BASIC METABOLIC PANEL WITH GFR
Anion gap: 10 (ref 5–15)
BUN: 11 mg/dL (ref 8–23)
CO2: 25 mmol/L (ref 22–32)
Calcium: 9.1 mg/dL (ref 8.9–10.3)
Chloride: 98 mmol/L (ref 98–111)
Creatinine, Ser: 0.76 mg/dL (ref 0.44–1.00)
GFR, Estimated: >60 mL/min
Glucose, Bld: 269 mg/dL — ABNORMAL HIGH (ref 70–99)
Potassium: 4.7 mmol/L (ref 3.5–5.1)
Sodium: 133 mmol/L — ABNORMAL LOW (ref 135–145)

## 2024-05-18 LAB — GLUCOSE, CAPILLARY
Glucose-Capillary: 321 mg/dL — ABNORMAL HIGH (ref 70–99)
Glucose-Capillary: 280 mg/dL — ABNORMAL HIGH (ref 70–99)
Glucose-Capillary: 385 mg/dL — ABNORMAL HIGH (ref 70–99)
Glucose-Capillary: 313 mg/dL — ABNORMAL HIGH (ref 70–99)

## 2024-05-18 LAB — CBC
HCT: 39.7 % (ref 36.0–46.0)
Hemoglobin: 13 g/dL (ref 12.0–15.0)
MCH: 31.7 pg (ref 26.0–34.0)
MCHC: 32.7 g/dL (ref 30.0–36.0)
MCV: 96.8 fL (ref 80.0–100.0)
Platelets: 190 10*3/uL (ref 150–400)
RBC: 4.1 MIL/uL (ref 3.87–5.11)
RDW: 12.5 % (ref 11.5–15.5)
WBC: 14.3 10*3/uL — ABNORMAL HIGH (ref 4.0–10.5)
nRBC: 0 % (ref 0.0–0.2)

## 2024-05-18 MED ORDER — INSULIN GLARGINE-YFGN 100 UNIT/ML ~~LOC~~ SOLN
8.0000 [IU] | Freq: Every day | SUBCUTANEOUS | Status: DC
  Administered 2024-05-18: 8 [IU] via SUBCUTANEOUS
  Filled 2024-05-18 (×2): qty 0.08

## 2024-05-18 NOTE — Progress Notes (Signed)
 Physical Therapy Treatment Patient Details Name: Caroline [name] MRN: [id number] DOB: 05-11-1955 Today's Date: 05/18/2024   History of Present Illness Pt is a 69 yo female presenting s/p L TKA on 05/17/2024 due to failure of conservative measures. Pt PMH includes but is not limited to: Hx of CVA, DM, GERD, HTN, OSA on CPAP, hx of DVT, IVC filter (04/2024) and L knee arthroscopy, partial medial and lateral meniscectomy, chrondroplasty (02/18/2024).    PT Comments  Pt very cooperative and actively participated in therex program but with attempts to mobilize, ambulated short distance in room with increasing nausea limiting progress.  RN aware and in to assess.      If plan is discharge home, recommend the following: Assistance with cooking/housework;Assist for transportation;A lot of help with walking and/or transfers;A lot of help with bathing/dressing/bathroom   Can travel by private vehicle        Equipment Recommendations  None recommended by PT    Recommendations for Other Services       Precautions / Restrictions Precautions Precautions: Fall;Knee Restrictions Weight Bearing Restrictions Per Provider Order: No LLE Weight Bearing Per Provider Order: Weight bearing as tolerated     Mobility  Bed Mobility Overal bed mobility: Needs Assistance Bed Mobility: Supine to Sit Rolling: Min assist, Used rails         General bed mobility comments: cues for sequence and use of R LE to self assist    Transfers Overall transfer level: Needs assistance Equipment used: Rolling walker (2 wheels) Transfers: Sit to/from Stand Sit to Stand: Min assist           General transfer comment: cues for LE management and use of UEs to self assist    Ambulation/Gait Ambulation/Gait assistance: Min assist, +2 safety/equipment Gait Distance (Feet): 6 Feet Assistive device: Rolling walker (2 wheels) Gait Pattern/deviations: Step-to pattern, Decreased step length - right, Decreased step  length - left, Shuffle, Trunk flexed Gait velocity: decr     General Gait Details: cues for sequence, posture and position from RW; distance ltd by nausea   Stairs             Wheelchair Mobility     Tilt Bed    Modified Rankin (Stroke Patients Only)       Balance Overall balance assessment: Needs assistance Sitting-balance support: Feet supported, No upper extremity supported Sitting balance-Leahy Scale: Fair     Standing balance support: Bilateral upper extremity supported Standing balance-Leahy Scale: Poor                              Communication Communication Communication: No apparent difficulties  Cognition Arousal: Alert Behavior During Therapy: Flat affect   PT - Cognitive impairments: No apparent impairments                         Following commands: Intact      Cueing Cueing Techniques: Verbal cues, Gestural cues  Exercises Total Joint Exercises Ankle Circles/Pumps: AROM, Both, 15 reps, Supine Quad Sets: AROM, Both, 10 reps, Supine Heel Slides: AAROM, Left, 15 reps, Supine Straight Leg Raises: AAROM, Left, 15 reps, Supine Goniometric ROM: AAROM L knee -5 - 40    General Comments        Pertinent Vitals/Pain Pain Assessment Pain Assessment: 0-10 Pain Score: 5  Pain Location: L knee and L E Pain Descriptors / Indicators: Aching, Constant, Discomfort, Dull, Grimacing, Operative site guarding  Pain Intervention(s): Limited activity within patient's tolerance, Monitored during session, Premedicated before session, Ice applied    Home Living                          Prior Function            PT Goals (current goals can now be found in the care plan section) Acute Rehab PT Goals Patient Stated Goal: regain IND PT Goal Formulation: With patient Time For Goal Achievement: 05/31/24 Potential to Achieve Goals: Good Progress towards PT goals: Progressing toward goals    Frequency    7X/week       PT Plan      Co-evaluation              AM-PAC PT 6 Clicks Mobility   Outcome Measure  Help needed turning from your back to your side while in a flat bed without using bedrails?: A Little Help needed moving from lying on your back to sitting on the side of a flat bed without using bedrails?: A Lot Help needed moving to and from a bed to a chair (including a wheelchair)?: A Lot Help needed standing up from a chair using your arms (e.g., wheelchair or bedside chair)?: A Lot Help needed to walk in hospital room?: A Lot Help needed climbing 3-5 steps with a railing? : Total 6 Click Score: 12    End of Session Equipment Utilized During Treatment: Gait belt Activity Tolerance: Other (comment) (nausea) Patient left: in chair;with call bell/phone within reach;with chair alarm set;with family/visitor present Nurse Communication: Mobility status;Other (comment) (nausea) PT Visit Diagnosis: Unsteadiness on feet (R26.81);Other abnormalities of gait and mobility (R26.89);Muscle weakness (generalized) (M62.81);Difficulty in walking, not elsewhere classified (R26.2);Pain Pain - Right/Left: Left Pain - part of body: Knee;Leg     Time: 1000-1030 PT Time Calculation (min) (ACUTE ONLY): 30 min  Charges:    $Gait Training: 8-22 mins $Therapeutic Exercise: 8-22 mins PT General Charges $$ ACUTE PT VISIT: 1 Visit                     [facility] PT Acute Rehabilitation Services Office [phone]    Sathvik Tiedt 05/18/2024, 1:26 PM

## 2024-05-18 NOTE — Progress Notes (Signed)
" °   05/18/24 2212  BiPAP/CPAP/SIPAP  BiPAP/CPAP/SIPAP Pt Type Adult  BiPAP/CPAP/SIPAP DREAMSTATIOND  Mask Type Full face mask  Dentures removed? Not applicable  Respiratory Rate 16 breaths/min  FiO2 (%) 21 %  Patient Home Machine No  Patient Home Mask Yes  Patient Home Tubing No  Auto Titrate Yes  Minimum cmH2O 5 cmH2O  Maximum cmH2O 20 cmH2O  CPAP/SIPAP surface wiped down Yes  Device Plugged into RED Power Outlet Yes    "

## 2024-05-18 NOTE — Progress Notes (Signed)
 Subjective: 1 Day Post-Op Procedures (LRB): ARTHROPLASTY, KNEE, TOTAL (Left) Patient reports pain as 4 on 0-10 scale.   Denies CP or SOB.  Voiding without difficulty. Positive flatus. Objective: Vital signs in last 24 hours: Temp:  [97.5 F (36.4 C)-98.7 F (37.1 C)] 98.1 F (36.7 C) (01/29 0627) Pulse Rate:  [67-89] 84 (01/29 0627) Resp:  [11-20] 17 (01/29 0627) BP: (105-151)/(69-94) 142/81 (01/29 0627) SpO2:  [92 %-100 %] 96 % (01/29 0627) Weight:  [79.8 kg] 79.8 kg (01/28 0815)  Intake/Output from previous day: 01/28 0701 - 01/29 0700 In: 2828.3 [P.O.:780; I.V.:1648.3; IV Piggyback:400] Out: 2700 [Urine:2600; Blood:100] Intake/Output this shift: No intake/output data recorded.  Recent Labs    05/18/24 0319  HGB 13.0   Recent Labs    05/18/24 0319  WBC 14.3*  RBC 4.10  HCT 39.7  PLT 190   Recent Labs    05/18/24 0319  NA 133*  K 4.7  CL 98  CO2 25  BUN 11  CREATININE 0.76  GLUCOSE 269*  CALCIUM  9.1   No results for input(s): LABPT, INR in the last 72 hours.  Neurologically intact ABD soft Intact pulses distally Dorsiflexion/Plantar flexion intact Incision: dressing C/D/I No DVT  Assessment/Plan:  1 Day Post-Op Procedures (LRB): ARTHROPLASTY, KNEE, TOTAL (Left) Advance diet Up with therapy Plan for discharge tomorrow Glucose sliding scale Consult medicine.     Principal Problem:   Left knee DJD      Reyes JAYSON Billing 05/18/2024, @NOW 

## 2024-05-18 NOTE — Consult Note (Signed)
 "  [facility] Initial Consultation Note  Caroline [name] FMW:[id number] DOB: 1955/06/08 DOA: 05/17/2024  PCP: Delilah Murray HERO., MD   Requesting Physician: Dr. Duwayne - Orthopedics   Reason for Consultation: Medical Management of Hyperglycemia  HPI: Caroline [name] is a 69 y.o. female with medical history significant for GERD, hypertension, CVA, diabetes as well as osteoarthritis who underwent left total knee replacement with Dr. Duwayne on 1/28 from which she is recovering well, and we were asked to consult due to hyperglycemia.  She does have a history of insulin -dependent type 2 diabetes, which appears to be historically well-controlled hemoglobin A1c on 05/08/2024 is 6.4.  Postoperatively, her blood sugars has been elevated ranging from 227-321 this morning.  She has been on moderate dose sliding scale, and a carb modified diet.  It appears that she was also on D5 NS infusion which was discontinued sometime in the night per RN.  She has also held her Mounjaro for the last couple of weeks in preparation for surgery.   Review of Systems: Please see HPI for pertinent positives and negatives. A complete 10 system review of systems are otherwise negative.  Past Medical History:  Diagnosis Date   Arthritis    CVA (cerebral vascular accident) (HCC)    Diabetes mellitus without complication (HCC)    GERD (gastroesophageal reflux disease)    Hypertension    Left carpal tunnel syndrome 07/13/2017   OSA on CPAP    PONV (postoperative nausea and vomiting)    Reflux    Past Surgical History:  Procedure Laterality Date   ABDOMINAL HYSTERECTOMY     APPENDECTOMY     EP IMPLANTABLE DEVICE N/A 01/08/2016   Procedure: Loop Recorder Insertion;  Surgeon: Danelle LELON Birmingham, MD;  Location: [facility] INVASIVE CV LAB;  Service: Cardiovascular;  Laterality: N/A;   EYE SURGERY Bilateral    cataract extraction   IR GENERIC HISTORICAL  01/02/2016   IR ANGIO VERTEBRAL SEL VERTEBRAL UNI L MOD SED 01/02/2016 Thyra Nash, MD [facility]INTER[facility]   IR GENERIC HISTORICAL  01/02/2016   IR ANGIO INTRA EXTRACRAN SEL COM CAROTID INNOMINATE BILAT MOD SED 01/02/2016 Thyra Nash, MD [facility]   IR GENERIC HISTORICAL  01/02/2016   IR ANGIO VERTEBRAL SEL SUBCLAVIAN INNOMINATE UNI R MOD SED 01/02/2016 Thyra Nash, MD [facility]   IVC FILTER INSERTION N/A 05/03/2024   Procedure: IVC FILTER INSERTION;  Surgeon: Lanis Fonda BRAVO, MD;  Location: [facility] INVASIVE CV LAB;  Service: Cardiovascular;  Laterality: N/A;   KNEE ARTHROSCOPY WITH MEDIAL MENISECTOMY Left 02/18/2024   Procedure: ARTHROSCOPY, KNEE, WITH MEDIAL MENISCECTOMY;  Surgeon: Duwayne Purchase, MD;  Location: [facility] ORS;  Service: Orthopedics;  Laterality: Left;   left cyst from breast remove     RADIOLOGY WITH ANESTHESIA N/A 01/02/2016   Procedure: RADIOLOGY WITH ANESTHESIA;  Surgeon: Medication Radiologist, MD;  Location: [facility] OR;  Service: Radiology;  Laterality: N/A;   TEE WITHOUT CARDIOVERSION N/A 01/08/2016   Procedure: TRANSESOPHAGEAL ECHOCARDIOGRAM (TEE);  Surgeon: Ezra GORMAN Shuck, MD;  Location: [facility] ENDOSCOPY;  Service: Cardiovascular;  Laterality: N/A;   TONSILLECTOMY      Social History:  reports that she has never smoked. She has never used smokeless tobacco. She reports that she does not drink alcohol and does not use drugs.  Allergies[1]  Family History  Problem Relation Age of Onset   Atrial fibrillation Mother    Suicidality Father        1971   Stroke Maternal Grandfather  Prior to Admission medications  Medication Sig Start Date End Date Taking? Authorizing Provider  apixaban  (ELIQUIS ) 2.5 MG TABS tablet Take 2.5 mg by mouth 2 (two) times daily. 01/14/18  Yes [provider]  Ascorbic Acid  (VITAMIN C  GUMMIES PO) Take 2 each by mouth in the morning.   Yes [provider]  atorvastatin  (LIPITOR) 40 MG tablet Take 40 mg by mouth at bedtime.   Yes [provider]  Biotin  w/ Vitamins C & E (HAIR SKIN &  NAILS GUMMIES PO) Take 2 each by mouth in the morning.   Yes [provider]  Cholecalciferol  (VITAMIN D -3 PO) Take 1 tablet by mouth daily.   Yes [provider]  cycloSPORINE  (RESTASIS ) 0.05 % ophthalmic emulsion Place 1 drop into both eyes 2 (two) times daily.   Yes [provider]  docusate sodium  (COLACE) 250 MG capsule Take 250 mg by mouth in the morning.   Yes [provider]  famotidine  (PEPCID ) 40 MG tablet Take 40 mg by mouth at bedtime. 12/19/19  Yes [provider]  fexofenadine (ALLEGRA) 180 MG tablet Take 180 mg by mouth in the morning. 05/26/22  Yes [provider]  fluticasone  (FLONASE ) 50 MCG/ACT nasal spray Place 2 sprays into both nostrils in the morning. 09/29/16  Yes [provider]  losartan  (COZAAR ) 25 MG tablet Take 25 mg by mouth daily. 03/25/16  Yes [provider]  MOUNJARO 12.5 MG/0.5ML Pen Inject 12.5 mg into the skin every Sunday.   Yes [provider]  Multiple Vitamin (MULTIVITAMIN WITH MINERALS) TABS tablet Take 2 tablets by mouth in the morning.   Yes [provider]  NIFEdipine  (PROCARDIA -XL/NIFEDICAL-XL) 30 MG 24 hr tablet Take 30 mg by mouth at bedtime.   Yes [provider]  omeprazole (PRILOSEC) 40 MG capsule Take 40 mg by mouth in the morning.   Yes [provider]  Probiotic Product (CULTURELLE PROBIOTIC + PREBIOT) CHEW Chew 2 each by mouth in the morning.   Yes [provider]  traMADol  (ULTRAM ) 50 MG tablet Take 50 mg by mouth daily as needed (pain.). 03/07/24  Yes [provider]  valACYclovir  (VALTREX ) 500 MG tablet Take 500 mg by mouth in the morning. 09/21/19  Yes [provider]  vitamin E  180 MG (400 UNITS) capsule Take 400 Units by mouth in the morning.   Yes [provider]  Blood Glucose Monitoring Suppl (ONETOUCH VERIO FLEX SYSTEM) w/Device KIT daily. 07/05/23   [provider]    Physical Exam: BP (!)  142/81 (BP Location: Left Arm)   Pulse 84   Temp 98.1 F (36.7 C) (Oral)   Resp 17   Ht 5' 3 (1.6 m)   Wt 79.8 kg   SpO2 96%   BMI 31.18 kg/m   General:  Alert, oriented, calm, in no acute distress, family at the bedside Eyes: EOMI, clear conjuctivae, white sclerea Cardiovascular: well perfused extremities no peripheral edema  Respiratory: Normal respiratory rate, no respiratory distress Musculoskeletal: no joint effusions, normal range of motion except postoperative left knee which is immobilized Neurologic: extraocular muscles intact, clear speech, moving all extremities         Recent Labs and Imaging Reviewed:  Basic Metabolic Panel: Recent Labs  Lab 05/18/24 0319  NA 133*  K 4.7  CL 98  CO2 25  GLUCOSE 269*  BUN 11  CREATININE 0.76  CALCIUM  9.1   Liver Function Tests: No results for input(s): AST, ALT, ALKPHOS, BILITOT, PROT,  ALBUMIN in the last 168 hours. No results for input(s): LIPASE, AMYLASE in the last 168 hours. No results for input(s): AMMONIA in the last 168 hours. CBC: Recent Labs  Lab 05/18/24 0319  WBC 14.3*  HGB 13.0  HCT 39.7  MCV 96.8  PLT 190   Cardiac Enzymes: No results for input(s): CKTOTAL, CKMB, CKMBINDEX, TROPONINI in the last 168 hours.  BNP (last 3 results) No results for input(s): BNP in the last 8760 hours.  ProBNP (last 3 results) No results for input(s): PROBNP in the last 8760 hours.  CBG: Recent Labs  Lab 05/17/24 1323 05/17/24 1422 05/17/24 1638 05/17/24 2125 05/18/24 0812  GLUCAP 192* 199* 227* 284* 321*    Radiological Exams on Admission: DG Knee 1-2 Views Left Result Date: 05/17/2024 EXAM: 1 or 2 VIEW(S) XRAY OF THE LEFT KNEE 05/17/2024 03:00:00 PM COMPARISON: 12/03/2023 CLINICAL HISTORY: Status post total knee replacement using cement. FINDINGS: BONES AND JOINTS: Left total knee arthroplasty noted. The patellofemoral component is present. The components appear well seated. No  acute fracture. No malalignment. No significant joint effusion. SOFT TISSUES: Anterior skin staples noted. Gas in soft tissues consistent with recent surgery. IMPRESSION: 1. Left total knee arthroplasty with well-seated components. 2. Gas in soft tissues consistent with recent surgery. Electronically signed by: Elsie Gravely MD 05/17/2024 04:08 PM EST RP Workstation: [id number]    Summary and Recommendations: Caroline [name] is a 69 y.o. female with medical history significant for GERD, hypertension, CVA, diabetes as well as osteoarthritis who underwent left total knee replacement with Dr. Duwayne on 1/28 from which she is recovering well, and we were asked to consult due to hyperglycemia.  Historically her diabetes is very well-controlled, last A1c earlier this month 6.4.  She likely has some hyperglycemia due to a combination of stress from surgery, D5 infusion postoperatively, as well as holding her Mounjaro. -Continue carb modified diet -Agree with moderate dose sliding scale insulin  -Start Lantus  8 units daily while in the hospital  Thank you for involving us  in the care of your patient.  [facility] will continue to follow along with you.    Code Status: Full Code  Time spent: 53 minutes  Braidyn Scorsone CHRISTELLA Gail MD [facility] Pager [phone]  If 7PM-7AM, please contact night-coverage [email or web address] Password [facility]  05/18/2024, 12:08 PM      [1]  Allergies Allergen Reactions   Amoxicillin Rash   Azithromycin Rash   Homatropine Rash   Hydrocodone  Bit-Homatrop Mbr Rash   Penicillin G Rash    Dyspepsia, ingestion   Tape Rash   "

## 2024-05-18 NOTE — Progress Notes (Signed)
 Physical Therapy Treatment Patient Details Name: Caroline [name] MRN: [id number] DOB: [birth year] Today's Date: 05/18/2024   History of Present Illness Pt is a 69 yo female presenting s/p L TKA on 05/17/2024 due to failure of conservative measures. Pt PMH includes but is not limited to: Hx of CVA, DM, GERD, HTN, OSA on CPAP, hx of DVT, IVC filter (04/2024) and L knee arthroscopy, partial medial and lateral meniscectomy, chrondroplasty (02/18/2024).    PT Comments  Pt continues very cooperative and attempted to mobilize but progress limited by onset of and increasing nausea.      If plan is discharge home, recommend the following: Assistance with cooking/housework;Assist for transportation;A lot of help with walking and/or transfers;A lot of help with bathing/dressing/bathroom   Can travel by private vehicle        Equipment Recommendations  None recommended by PT    Recommendations for Other Services       Precautions / Restrictions Precautions Precautions: Fall;Knee Restrictions Weight Bearing Restrictions Per Provider Order: No LLE Weight Bearing Per Provider Order: Weight bearing as tolerated     Mobility  Bed Mobility Overal bed mobility: Needs Assistance Bed Mobility: Supine to Sit, Sit to Supine Rolling: Contact guard assist   Supine to sit: Min assist     General bed mobility comments: cues for sequence, use of R LE to self assist, use of gait belt    Transfers Overall transfer level: Needs assistance Equipment used: Rolling walker (2 wheels) Transfers: Sit to/from Stand Sit to Stand: Contact guard assist           General transfer comment: cues for LE management and use of UEs to self assist    Ambulation/Gait Ambulation/Gait assistance: Min assist Gait Distance (Feet): 16 Feet Assistive device: Rolling walker (2 wheels) Gait Pattern/deviations: Step-to pattern, Decreased step length - right, Decreased step length - left, Shuffle, Trunk flexed Gait  velocity: decr     General Gait Details: cues for sequence, posture and position from RW; distance ltd by nausea   Stairs             Wheelchair Mobility     Tilt Bed    Modified Rankin (Stroke Patients Only)       Balance Overall balance assessment: Needs assistance Sitting-balance support: Feet supported, No upper extremity supported Sitting balance-Leahy Scale: Good     Standing balance support: Bilateral upper extremity supported Standing balance-Leahy Scale: Poor                              Communication Communication Communication: No apparent difficulties  Cognition Arousal: Alert Behavior During Therapy: Flat affect   PT - Cognitive impairments: No apparent impairments                         Following commands: Intact      Cueing Cueing Techniques: Verbal cues, Gestural cues  Exercises Total Joint Exercises Ankle Circles/Pumps: AROM, Both, 15 reps, Supine Quad Sets: AROM, Both, 10 reps, Supine Heel Slides: AAROM, Left, 15 reps, Supine Straight Leg Raises: AAROM, Left, 15 reps, Supine Goniometric ROM: AAROM L knee -5 - 40    General Comments        Pertinent Vitals/Pain Pain Assessment Pain Assessment: 0-10 Pain Score: 7  Pain Location: L knee and L E Pain Descriptors / Indicators: Aching, Discomfort, Dull, Grimacing, Operative site guarding Pain Intervention(s): Limited activity within patient's tolerance  Home Living                          Prior Function            PT Goals (current goals can now be found in the care plan section) Acute Rehab PT Goals Patient Stated Goal: regain IND PT Goal Formulation: With patient Time For Goal Achievement: 05/31/24 Potential to Achieve Goals: Good Progress towards PT goals: Progressing toward goals    Frequency    7X/week      PT Plan      Co-evaluation              AM-PAC PT 6 Clicks Mobility   Outcome Measure  Help needed  turning from your back to your side while in a flat bed without using bedrails?: A Little Help needed moving from lying on your back to sitting on the side of a flat bed without using bedrails?: A Lot Help needed moving to and from a bed to a chair (including a wheelchair)?: A Lot Help needed standing up from a chair using your arms (e.g., wheelchair or bedside chair)?: A Lot Help needed to walk in hospital room?: A Lot Help needed climbing 3-5 steps with a railing? : Total 6 Click Score: 12    End of Session Equipment Utilized During Treatment: Gait belt Activity Tolerance: Other (comment) (nausea) Patient left: in bed;with call bell/phone within reach;with bed alarm set;with family/visitor present Nurse Communication: Mobility status;Other (comment) (nausea) PT Visit Diagnosis: Unsteadiness on feet (R26.81);Other abnormalities of gait and mobility (R26.89);Muscle weakness (generalized) (M62.81);Difficulty in walking, not elsewhere classified (R26.2);Pain Pain - Right/Left: Left Pain - part of body: Knee;Leg     Time: 8497-8479 PT Time Calculation (min) (ACUTE ONLY): 18 min  Charges:    $Gait Training: 8-22 mins $Therapeutic Exercise: 8-22 mins PT General Charges $$ ACUTE PT VISIT: 1 Visit                     [facility] PT Acute Rehabilitation Services Office [phone]    Andrue Dini 05/18/2024, 3:23 PM

## 2024-05-18 NOTE — TOC Transition Note (Signed)
 Transition of Care [facility]) - Discharge Note   Patient Details  Name: Caroline [name] MRN: [id number] Date of Birth: Apr 26, 1955  [facility] CM/SW Contact:  NORMAN ASPEN, LCSW Phone Number: 05/18/2024, 10:03 AM   Clinical Narrative:     Met with pt/ daughter and confirming she has needed DME in the home.  HHPT prearranged with [facility] via ortho office.  No further IP CM needs.  Final next level of care: Home w Home Health Services Barriers to Discharge: No Barriers Identified   Patient Goals and CMS Choice Patient states their goals for this hospitalization and ongoing recovery are:: return home          Discharge Placement                       Discharge Plan and Services Additional resources added to the After Visit Summary for                  DME Arranged: N/A DME Agency: NA       HH Arranged: PT HH Agency: [facility]        Social Drivers of Health (SDOH) Interventions SDOH Screenings   Food Insecurity: No Food Insecurity (05/17/2024)  Housing: Low Risk (05/17/2024)  Transportation Needs: No Transportation Needs (05/17/2024)  Utilities: Not At Risk (05/17/2024)  Social Connections: Socially Integrated (05/17/2024)  Tobacco Use: Low Risk (05/17/2024)     Readmission Risk Interventions     No data to display

## 2024-05-18 NOTE — Plan of Care (Signed)
" °  Problem: Pain Management: Goal: Pain level will decrease with appropriate interventions Outcome: Progressing   Problem: Activity: Goal: Range of joint motion will improve Outcome: Progressing   Problem: Education: Goal: Knowledge of the prescribed therapeutic regimen will improve Outcome: Progressing   Problem: Education: Goal: Knowledge of the prescribed therapeutic regimen will improve Outcome: Progressing   "

## 2024-05-18 NOTE — Discharge Instructions (Signed)

## 2024-05-19 ENCOUNTER — Other Ambulatory Visit (HOSPITAL_COMMUNITY): Payer: Self-pay

## 2024-05-19 DIAGNOSIS — M1712 Unilateral primary osteoarthritis, left knee: Secondary | ICD-10-CM | POA: Diagnosis not present

## 2024-05-19 LAB — CBC
HCT: 38.4 % (ref 36.0–46.0)
Hemoglobin: 12.3 g/dL (ref 12.0–15.0)
MCH: 31.3 pg (ref 26.0–34.0)
MCHC: 32 g/dL (ref 30.0–36.0)
MCV: 97.7 fL (ref 80.0–100.0)
Platelets: 177 10*3/uL (ref 150–400)
RBC: 3.93 MIL/uL (ref 3.87–5.11)
RDW: 12.8 % (ref 11.5–15.5)
WBC: 14.6 10*3/uL — ABNORMAL HIGH (ref 4.0–10.5)
nRBC: 0 % (ref 0.0–0.2)

## 2024-05-19 LAB — GLUCOSE, CAPILLARY
Glucose-Capillary: 273 mg/dL — ABNORMAL HIGH (ref 70–99)
Glucose-Capillary: 300 mg/dL — ABNORMAL HIGH (ref 70–99)

## 2024-05-19 LAB — BASIC METABOLIC PANEL WITH GFR
Anion gap: 10 (ref 5–15)
BUN: 13 mg/dL (ref 8–23)
CO2: 26 mmol/L (ref 22–32)
Calcium: 9.1 mg/dL (ref 8.9–10.3)
Chloride: 99 mmol/L (ref 98–111)
Creatinine, Ser: 0.7 mg/dL (ref 0.44–1.00)
GFR, Estimated: >60 mL/min
Glucose, Bld: 274 mg/dL — ABNORMAL HIGH (ref 70–99)
Potassium: 4.3 mmol/L (ref 3.5–5.1)
Sodium: 135 mmol/L (ref 135–145)

## 2024-05-19 MED ORDER — INSULIN GLARGINE-YFGN 100 UNIT/ML ~~LOC~~ SOLN
10.0000 [IU] | Freq: Every day | SUBCUTANEOUS | Status: DC
  Administered 2024-05-19: 10 [IU] via SUBCUTANEOUS
  Filled 2024-05-19: qty 0.1

## 2024-05-19 MED ORDER — GLIPIZIDE 5 MG PO TABS
5.0000 mg | ORAL_TABLET | Freq: Every day | ORAL | 0 refills | Status: AC | PRN
  Filled 2024-05-19: qty 20, 20d supply, fill #0

## 2024-05-19 MED ORDER — TRAMADOL HCL 50 MG PO TABS: 50.0000 mg | ORAL_TABLET | Freq: Four times a day (QID) | ORAL | 1 refills | Status: AC | PRN

## 2024-05-19 MED ORDER — DOCUSATE SODIUM 100 MG PO CAPS: 100.0000 mg | ORAL_CAPSULE | Freq: Two times a day (BID) | ORAL | 2 refills | Status: AC

## 2024-05-19 MED ORDER — OXYCODONE HCL 5 MG PO TABS: 5.0000 mg | ORAL_TABLET | ORAL | 0 refills | Status: AC | PRN

## 2024-05-19 NOTE — Plan of Care (Signed)
  Problem: Activity: Goal: Risk for activity intolerance will decrease Outcome: Progressing   Problem: Pain Managment: Goal: General experience of comfort will improve and/or be controlled Outcome: Progressing

## 2024-05-19 NOTE — Progress Notes (Signed)
 Discharge meds in a secure bag delivered to patient by this RN

## 2024-05-19 NOTE — Progress Notes (Signed)
 Physical Therapy Treatment Patient Details Name: Caroline [name] MRN: [id number] DOB: [birth year] Today's Date: 05/19/2024   History of Present Illness Pt is a 69 yo female presenting s/p L TKA on 05/17/2024 due to failure of conservative measures. Pt PMH includes but is not limited to: Hx of CVA, DM, GERD, HTN, OSA on CPAP, hx of DVT, IVC filter (04/2024) and L knee arthroscopy, partial medial and lateral meniscectomy, chrondroplasty (02/18/2024).    PT Comments  Pt continues cooperative and progressing slowly but steadily with mobility.  Pt up to ambulate increased distance in hall with improvement in stability and no c/o nausea.  Pt eager for dc home and family present and in agreement.  Pt provided with and reviewed written HEP.    If plan is discharge home, recommend the following: Assistance with cooking/housework;Assist for transportation;A lot of help with walking and/or transfers;A lot of help with bathing/dressing/bathroom   Can travel by private vehicle        Equipment Recommendations  None recommended by PT    Recommendations for Other Services       Precautions / Restrictions Precautions Precautions: Fall;Knee Restrictions Weight Bearing Restrictions Per Provider Order: No LLE Weight Bearing Per Provider Order: Weight bearing as tolerated     Mobility  Bed Mobility               General bed mobility comments: Pt up in recliner and returns to same    Transfers Overall transfer level: Needs assistance Equipment used: Rolling walker (2 wheels) Transfers: Sit to/from Stand Sit to Stand: Contact guard assist           General transfer comment: cues for LE management and use of UEs to self assist; min assist to control L LE on descent to floor    Ambulation/Gait Ambulation/Gait assistance: Contact guard assist Gait Distance (Feet): 78 Feet Assistive device: Rolling walker (2 wheels) Gait Pattern/deviations: Step-to pattern, Decreased step length - right,  Decreased step length - left, Shuffle, Trunk flexed Gait velocity: decr     General Gait Details: cues for sequence, posture and position from RW; distance ltd by nausea   Stairs             Wheelchair Mobility     Tilt Bed    Modified Rankin (Stroke Patients Only)       Balance Overall balance assessment: Needs assistance Sitting-balance support: Feet supported, No upper extremity supported Sitting balance-Leahy Scale: Good     Standing balance support: No upper extremity supported Standing balance-Leahy Scale: Fair Standing balance comment: standing at sink to wash hands                            Communication Communication Communication: No apparent difficulties  Cognition Arousal: Alert Behavior During Therapy: Flat affect   PT - Cognitive impairments: No apparent impairments                       PT - Cognition Comments: PT arrived s/p IV pain medication and mm relaxer Following commands: Intact      Cueing Cueing Techniques: Verbal cues, Gestural cues  Exercises Total Joint Exercises Ankle Circles/Pumps: AROM, Both, 15 reps, Supine Quad Sets: AROM, Both, 10 reps, Supine Heel Slides: AAROM, Left, 15 reps, Supine Straight Leg Raises: AAROM, Left, 15 reps, Supine Goniometric ROM: AAROM L knee -5 - 55    General Comments  Pertinent Vitals/Pain Pain Assessment Pain Assessment: 0-10 Pain Score: 5  Pain Location: L knee and L E Pain Descriptors / Indicators: Aching, Discomfort, Dull, Grimacing, Operative site guarding Pain Intervention(s): Limited activity within patient's tolerance, Monitored during session, Premedicated before session, Ice applied    Home Living                          Prior Function            PT Goals (current goals can now be found in the care plan section) Acute Rehab PT Goals Patient Stated Goal: regain IND PT Goal Formulation: With patient Time For Goal Achievement:  05/31/24 Potential to Achieve Goals: Good Progress towards PT goals: Progressing toward goals    Frequency    7X/week      PT Plan      Co-evaluation              AM-PAC PT 6 Clicks Mobility   Outcome Measure  Help needed turning from your back to your side while in a flat bed without using bedrails?: A Little Help needed moving from lying on your back to sitting on the side of a flat bed without using bedrails?: A Lot Help needed moving to and from a bed to a chair (including a wheelchair)?: A Little Help needed standing up from a chair using your arms (e.g., wheelchair or bedside chair)?: A Little Help needed to walk in hospital room?: A Little Help needed climbing 3-5 steps with a railing? : A Lot 6 Click Score: 16    End of Session Equipment Utilized During Treatment: Gait belt Activity Tolerance: Patient tolerated treatment well;Patient limited by fatigue Patient left: in chair;with call bell/phone within reach;with family/visitor present Nurse Communication: Mobility status PT Visit Diagnosis: Unsteadiness on feet (R26.81);Other abnormalities of gait and mobility (R26.89);Muscle weakness (generalized) (M62.81);Difficulty in walking, not elsewhere classified (R26.2);Pain Pain - Right/Left: Left Pain - part of body: Knee;Leg     Time: 1316-1340 PT Time Calculation (min) (ACUTE ONLY): 24 min  Charges:    $Gait Training: 8-22 mins $Therapeutic Exercise: 8-22 mins PT General Charges $$ ACUTE PT VISIT: 1 Visit                     [facility] PT Acute Rehabilitation Services Office [phone]    Diahn Waidelich 05/19/2024, 2:27 PM

## 2024-05-19 NOTE — Progress Notes (Signed)
 Physical Therapy Treatment Patient Details Name: Caroline [name] MRN: [id number] DOB: 1955/08/16 Today's Date: 05/19/2024   History of Present Illness Pt is a 69 yo female presenting s/p L TKA on 05/17/2024 due to failure of conservative measures. Pt PMH includes but is not limited to: Hx of CVA, DM, GERD, HTN, OSA on CPAP, hx of DVT, IVC filter (04/2024) and L knee arthroscopy, partial medial and lateral meniscectomy, chrondroplasty (02/18/2024).    PT Comments  Pt continues very cooperative but progressing slowly with mobility 2* fatigue - but with no c/o nausea this am.  Pt performed HEP with assist and up to bathroom for toileting with had hygiene standing at sink.  Pt limited by fatigue but hopeful for dc home this date. Attempted to see pt 2nd time ~ 1100 but deferred to later at pt request 2* fatigue. Rechecked ~1135 and nursing reports pt sleeping hard.   If plan is discharge home, recommend the following: Assistance with cooking/housework;Assist for transportation;A lot of help with walking and/or transfers;A lot of help with bathing/dressing/bathroom   Can travel by private vehicle        Equipment Recommendations  None recommended by PT    Recommendations for Other Services       Precautions / Restrictions Precautions Precautions: Fall;Knee Restrictions Weight Bearing Restrictions Per Provider Order: No LLE Weight Bearing Per Provider Order: Weight bearing as tolerated     Mobility  Bed Mobility               General bed mobility comments: Pt up in recliner and returns to same    Transfers Overall transfer level: Needs assistance Equipment used: Rolling walker (2 wheels) Transfers: Sit to/from Stand Sit to Stand: Min assist           General transfer comment: cues for LE management and use of UEs to self assist; min assist to control L LE on descent to floor    Ambulation/Gait Ambulation/Gait assistance: Min assist, Contact guard assist Gait Distance  (Feet): 30 Feet (15' twice to/from bathroom) Assistive device: Rolling walker (2 wheels) Gait Pattern/deviations: Step-to pattern, Decreased step length - right, Decreased step length - left, Shuffle, Trunk flexed Gait velocity: decr     General Gait Details: cues for sequence, posture and position from RW; distance ltd by nausea   Stairs             Wheelchair Mobility     Tilt Bed    Modified Rankin (Stroke Patients Only)       Balance Overall balance assessment: Needs assistance Sitting-balance support: Feet supported, No upper extremity supported Sitting balance-Leahy Scale: Good     Standing balance support: No upper extremity supported Standing balance-Leahy Scale: Fair Standing balance comment: standing at sink to wash hands                            Communication Communication Communication: No apparent difficulties  Cognition Arousal: Alert Behavior During Therapy: Flat affect   PT - Cognitive impairments: No apparent impairments                         Following commands: Intact      Cueing Cueing Techniques: Verbal cues, Gestural cues  Exercises Total Joint Exercises Ankle Circles/Pumps: AROM, Both, 15 reps, Supine Quad Sets: AROM, Both, 10 reps, Supine Heel Slides: AAROM, Left, 15 reps, Supine Straight Leg Raises: AAROM, Left, 15 reps, Supine  Goniometric ROM: AAROM L knee -5 - 55    General Comments        Pertinent Vitals/Pain Pain Assessment Pain Assessment: 0-10 Pain Score: 6  Pain Location: L knee and L E Pain Descriptors / Indicators: Aching, Discomfort, Dull, Grimacing, Operative site guarding Pain Intervention(s): Limited activity within patient's tolerance, Monitored during session, Premedicated before session, Ice applied    Home Living                          Prior Function            PT Goals (current goals can now be found in the care plan section) Acute Rehab PT Goals Patient  Stated Goal: regain IND PT Goal Formulation: With patient Time For Goal Achievement: 05/31/24 Potential to Achieve Goals: Good Progress towards PT goals: Progressing toward goals    Frequency    7X/week      PT Plan      Co-evaluation              AM-PAC PT 6 Clicks Mobility   Outcome Measure  Help needed turning from your back to your side while in a flat bed without using bedrails?: A Little Help needed moving from lying on your back to sitting on the side of a flat bed without using bedrails?: A Lot Help needed moving to and from a bed to a chair (including a wheelchair)?: A Little Help needed standing up from a chair using your arms (e.g., wheelchair or bedside chair)?: A Little Help needed to walk in hospital room?: A Little Help needed climbing 3-5 steps with a railing? : A Lot 6 Click Score: 16    End of Session Equipment Utilized During Treatment: Gait belt Activity Tolerance: Patient tolerated treatment well;Patient limited by fatigue Patient left: in chair;with call bell/phone within reach;with family/visitor present Nurse Communication: Mobility status PT Visit Diagnosis: Unsteadiness on feet (R26.81);Other abnormalities of gait and mobility (R26.89);Muscle weakness (generalized) (M62.81);Difficulty in walking, not elsewhere classified (R26.2);Pain Pain - Right/Left: Left Pain - part of body: Knee;Leg     Time: 9049-8980 PT Time Calculation (min) (ACUTE ONLY): 29 min  Charges:    $Gait Training: 8-22 mins $Therapeutic Exercise: 8-22 mins PT General Charges $$ ACUTE PT VISIT: 1 Visit                     [facility] PT Acute Rehabilitation Services Office [phone]    Elyse Prevo 05/19/2024, 12:31 PM

## 2024-05-19 NOTE — Plan of Care (Signed)
" °  Problem: Safety: Goal: Ability to remain free from injury will improve Outcome: Progressing   Problem: Pain Managment: Goal: General experience of comfort will improve and/or be controlled Outcome: Progressing   Problem: Elimination: Goal: Will not experience complications related to urinary retention Outcome: Progressing   Problem: Education: Goal: Knowledge of the prescribed therapeutic regimen will improve Outcome: Progressing   "

## 2024-05-19 NOTE — Inpatient Diabetes Management (Addendum)
 Inpatient Diabetes Program Recommendations  AACE/ADA: New Consensus Statement on Inpatient Glycemic Control (2015)  Target Ranges:  Prepandial:   less than 140 mg/dL      Peak postprandial:   less than 180 mg/dL (1-2 hours)      Critically ill patients:  140 - 180 mg/dL   Lab Results  Component Value Date   GLUCAP 273 (H) 05/19/2024   HGBA1C 6.7 (H) 05/18/2024    Received referral for DC recommendations.  Chart reviewed.  Stopped Mounjaro in preparation for surgery.  A1C was 6.7 on 1/29.  Unsure if she received steroids for surgery-not documented.    Please consider Glipizide  2.5 mg QAM and resume Mounjaro.  Check CBGs ac/hs and follow up with PCP in 1-2 weeks.  Will speak with pt this morning.    Met with patient at bedside.  She will start Mounjaro today when she gets home.  Dr. Trixie has prescribed glipizide  5 mg every day for a short period of time.  Discussed glipizide  with patient.  She has taken this medication in the past.  Reviewed hypoglycemia, < 70 mg/dL, signs, symptoms and treatments.  Asked her to check her BG more often while taking glipizide -AC/HS.     Thank you, Wyvonna Pinal, MSN, CDCES Diabetes Coordinator Inpatient Diabetes Program [phone] (team pager from 8a-5p)

## 2024-05-19 NOTE — Progress Notes (Addendum)
 " PROGRESS NOTE  Caroline [name] FMW:[id number] DOB: [birth year] DOA: 05/17/2024 PCP: Delilah Murray HERO., MD   LOS: 0 days   Brief Narrative / Interim history: 69 year old female with history of DM2, HTN, prior CVA who was being electively admitted for left TKA, and we have been asked to consult for hyperglycemia.  Subjective / 24h Interval events: She is doing well this morning, denies any nausea or vomiting, no chest pain, no shortness of breath.  She tells me that her diabetes is pretty well-controlled at home on Mounjaro only, but she stopped the Mounjaro at the beginning of January in preparation for surgery.  Assesement and Plan: Principal problem DM 2, with hyperglycemia -patient's diabetes has been decently well-controlled on Mounjaro only.  Her most recent hemoglobin A1c done on 05/18/2024 was 6.7.  She has been more hyperglycemic here in the hospital, and this possibly was induced in the setting of hospitalization and surgery.  She had to be started on insulin  - Discussed with the patient that she has been off her Mounjaro for the past few weeks, and she feels like she may have started to eat a little bit more as she was more hungry.  I suspect this plays a major role in her current hyperglycemia, along with the stress of surgery.  Discussed the fact that she needs insulin  here, but I highly doubt that she will need that for home given well-controlled A1c - She is okay to go home off insulin , she does not want to start that anyway, she will go back on her Mounjaro as soon as she arrives home.  I discussed about a stricter diet over the next few days in the setting of recovery as well as close monitoring of her CBGs.  She will receive coverage today prior to discharge, and I would favor for her to have some glipizide  at home in case her sugars go above 250.  She has been on glipizide  in the past and has tolerated it.  Discussed about hypoglycemia symptoms, she has awareness and has had very rare  episodes in the past.  Active problems Left TKA -management per orthopedic surgery  Essential hypertension-resume home medications  Scheduled Meds:  acidophilus  1 capsule Oral Daily   apixaban   2.5 mg Oral Q12H   ascorbic acid   500 mg Oral Daily   atorvastatin   40 mg Oral QHS   cholecalciferol   1,000 Units Oral Daily   cycloSPORINE   1 drop Both Eyes BID   docusate sodium   100 mg Oral BID   famotidine   40 mg Oral QHS   fluticasone   2 spray Each Nare Daily   insulin  aspart  0-15 Units Subcutaneous TID WC   insulin  glargine-yfgn  8 Units Subcutaneous Daily   loratadine   10 mg Oral Daily   losartan   25 mg Oral Daily   NIFEdipine   30 mg Oral QHS   pantoprazole   80 mg Oral Daily   polyethylene glycol  17 g Oral Daily   valACYclovir   500 mg Oral Daily   Continuous Infusions: PRN Meds:.alum & mag hydroxide-simeth, bisacodyl , diphenhydrAMINE , HYDROmorphone  (DILAUDID ) injection, HYDROmorphone , magnesium  citrate, menthol  **OR** phenol, methocarbamol  **OR** methocarbamol  (ROBAXIN ) injection, metoCLOPramide  **OR** metoCLOPramide  (REGLAN ) injection, ondansetron  **OR** ondansetron  (ZOFRAN ) IV  Current Outpatient Medications  Medication Instructions   apixaban  (ELIQUIS ) 2.5 mg, 2 times daily   Ascorbic Acid  (VITAMIN C  GUMMIES PO) 2 each, Every morning   atorvastatin  (LIPITOR) 40 mg, Daily at bedtime   Biotin  w/ Vitamins C & E (HAIR  SKIN & NAILS GUMMIES PO) 2 each, Every morning   Blood Glucose Monitoring Suppl (ONETOUCH VERIO FLEX SYSTEM) w/Device KIT Daily   Cholecalciferol  (VITAMIN D -3 PO) 1 tablet, Daily   cycloSPORINE  (RESTASIS ) 0.05 % ophthalmic emulsion 1 drop, 2 times daily   docusate sodium  (COLACE) 250 mg, Every morning   famotidine  (PEPCID ) 40 mg, Daily at bedtime   fexofenadine (ALLEGRA) 180 mg, Every morning   fluticasone  (FLONASE ) 50 MCG/ACT nasal spray 2 sprays, Every morning   losartan  (COZAAR ) 25 mg, Daily   Mounjaro 12.5 mg, Every Sun   Multiple Vitamin (MULTIVITAMIN  WITH MINERALS) TABS tablet 2 tablets, Every morning   NIFEdipine  (PROCARDIA -XL/NIFEDICAL-XL) 30 mg, Daily at bedtime   omeprazole (PRILOSEC) 40 mg, Every morning   Probiotic Product (CULTURELLE PROBIOTIC + PREBIOT) CHEW 2 each, Every morning   traMADol  (ULTRAM ) 50 mg, Daily PRN   valACYclovir  (VALTREX ) 500 mg, Every morning   vitamin E  400 Units, Every morning    Diet Orders (From admission, onward)     Start     Ordered   05/17/24 1557  Diet Carb Modified Room service appropriate? Yes  Diet effective now       Question Answer Comment  Diet-HS Snack? Nothing   Calorie Level Medium 1600-2000   Fluid consistency: Thin   Room service appropriate? Yes      05/17/24 1558            DVT prophylaxis: apixaban  (ELIQUIS ) tablet 2.5 mg Start: 05/18/24 0800 SCDs Start: 05/17/24 1557 Place TED hose Start: 05/17/24 1557 apixaban  (ELIQUIS ) tablet 2.5 mg   Lab Results  Component Value Date   PLT 177 05/19/2024      Code Status: Full Code  Family Communication: Daughter present at bedside   Level of care: Med-Surg  Objective: Vitals:   05/18/24 0627 05/18/24 1501 05/18/24 2033 05/19/24 0545  BP: (!) 142/81 (!) 151/74 (!) 153/76 129/77  Pulse: 84 93 93 (!) 107  Resp: 17 19 15 16   Temp: 98.1 F (36.7 C) 98.6 F (37 C) 98.2 F (36.8 C) (!) 97.5 F (36.4 C)  TempSrc: Oral Oral Oral   SpO2: 96% 97% 96% 96%  Weight:      Height:        Intake/Output Summary (Last 24 hours) at 05/19/2024 0750 Last data filed at 05/19/2024 0500 Gross per 24 hour  Intake 1240 ml  Output --  Net 1240 ml   Wt Readings from Last 3 Encounters:  05/17/24 79.8 kg  05/08/24 79.8 kg  05/03/24 78 kg    Examination:  Constitutional: NAD Eyes: no scleral icterus ENMT: Mucous membranes are moist.  Neck: normal, supple Respiratory: clear to auscultation bilaterally, no wheezing, no crackles. Cardiovascular: Regular rate and rhythm, no murmurs / rubs / gallops. No LE edema.  Abdomen: non  distended, no tenderness. Bowel sounds positive.  Musculoskeletal: no clubbing / cyanosis.    Data Reviewed: I have independently reviewed following labs and imaging studies   CBC Recent Labs  Lab 05/18/24 0319 05/19/24 0316  WBC 14.3* 14.6*  HGB 13.0 12.3  HCT 39.7 38.4  PLT 190 177  MCV 96.8 97.7  MCH 31.7 31.3  MCHC 32.7 32.0  RDW 12.5 12.8    Recent Labs  Lab 05/18/24 0319 05/19/24 0316  NA 133* 135  K 4.7 4.3  CL 98 99  CO2 25 26  GLUCOSE 269* 274*  BUN 11 13  CREATININE 0.76 0.70  CALCIUM  9.1 9.1  HGBA1C 6.7*  --     ------------------------------------------------------------------------------------------------------------------  No results for input(s): CHOL, HDL, LDLCALC, TRIG, CHOLHDL, LDLDIRECT in the last 72 hours.  Lab Results  Component Value Date   HGBA1C 6.7 (H) 05/18/2024   ------------------------------------------------------------------------------------------------------------------ No results for input(s): TSH, T4TOTAL, T3FREE, THYROIDAB in the last 72 hours.  Invalid input(s): FREET3  Cardiac Enzymes No results for input(s): CKMB, TROPONINI, MYOGLOBIN in the last 168 hours.  Invalid input(s): CK ------------------------------------------------------------------------------------------------------------------ No results found for: BNP  CBG: Recent Labs  Lab 05/18/24 0812 05/18/24 1238 05/18/24 1643 05/18/24 2128 05/19/24 0746  GLUCAP 321* 280* 385* 313* 273*    No results found for this or any previous visit (from the past 240 hours).   Radiology Studies: No results found.   Nilda Fendt, MD, PhD [facility]  Between 7 am - 7 pm I am available, please contact me via Amion (for emergencies) or Securechat (non urgent messages)  Between 7 pm - 7 am I am not available, please contact night coverage MD/APP via Amion  "

## 2024-05-19 NOTE — Discharge Summary (Signed)
 " Physician Discharge Summary   Patient ID: Caroline [name] MRN: [id number] DOB/AGE: [birth year] 71 y.o.  Admit date: 05/17/2024 Discharge date: 05/19/24  Primary Diagnosis: left knee primary osteoarthritis  Admission Diagnoses:  Past Medical History:  Diagnosis Date   Arthritis    CVA (cerebral vascular accident) (HCC)    Diabetes mellitus without complication (HCC)    GERD (gastroesophageal reflux disease)    Hypertension    Left carpal tunnel syndrome 07/13/2017   OSA on CPAP    PONV (postoperative nausea and vomiting)    Reflux    Discharge Diagnoses:   Principal Problem:   Left knee DJD  Estimated body mass index is 31.18 kg/m as calculated from the following:   Height as of this encounter: 5' 3 (1.6 m).   Weight as of this encounter: 79.8 kg.  Procedure:  Procedures (LRB): ARTHROPLASTY, KNEE, TOTAL (Left)   Consults: hospitalist  HPI: see H&P Laboratory Data: Admission on 05/17/2024  Component Date Value Ref Range Status   MRSA, PCR 05/08/2024 NEGATIVE  NEGATIVE Final   Staphylococcus aureus 05/08/2024 NEGATIVE  NEGATIVE Final   Comment: (NOTE) The Xpert SA Assay (FDA approved for NASAL specimens in patients 63 years of age and older), is one component of a comprehensive surveillance program. It is not intended to diagnose infection nor to guide or monitor treatment. Performed at [facility], [address] [place], [place] [address]    Hgb A1c MFr Bld 05/08/2024 6.4 (H)  4.8 - 5.6 % Final   Comment: (NOTE) Diagnosis of Diabetes The following HbA1c ranges recommended by the American Diabetes Association (ADA) may be used as an aid in the diagnosis of diabetes mellitus.  Hemoglobin             Suggested A1C NGSP%              Diagnosis  <5.7                   Non Diabetic  5.7-6.4                Pre-Diabetic  >6.4                   Diabetic  <7.0                   Glycemic control for                       adults with  diabetes.     Mean Plasma Glucose 05/08/2024 136.98  mg/dL Final   Performed at [facility], [address], [place], [place] [address]   Sodium 05/08/2024 141  135 - 145 mmol/L Final   Potassium 05/08/2024 4.4  3.5 - 5.1 mmol/L Final   Chloride 05/08/2024 107  98 - 111 mmol/L Final   CO2 05/08/2024 23  22 - 32 mmol/L Final   Glucose, Bld 05/08/2024 140 (H)  70 - 99 mg/dL Final   Glucose reference range applies only to samples taken after fasting for at least 8 hours.   BUN 05/08/2024 15  8 - 23 mg/dL Final   Creatinine, Ser 05/08/2024 0.84  0.44 - 1.00 mg/dL Final   Calcium  05/08/2024 9.6  8.9 - 10.3 mg/dL Final   GFR, Estimated 05/08/2024 >60  >60 mL/min Final   Comment: (NOTE) Calculated using the CKD-EPI Creatinine Equation (2021)    Anion gap 05/08/2024 11  5 - 15 Final  Performed at [facility], [address], [place], [place] [address]   WBC 05/08/2024 9.6  4.0 - 10.5 K/uL Final   RBC 05/08/2024 4.50  3.87 - 5.11 MIL/uL Final   Hemoglobin 05/08/2024 14.5  12.0 - 15.0 g/dL Final   HCT 98/80/7973 44.8  36.0 - 46.0 % Final   MCV 05/08/2024 99.6  80.0 - 100.0 fL Final   MCH 05/08/2024 32.2  26.0 - 34.0 pg Final   MCHC 05/08/2024 32.4  30.0 - 36.0 g/dL Final   RDW 98/80/7973 12.6  11.5 - 15.5 % Final   Platelets 05/08/2024 197  150 - 400 K/uL Final   nRBC 05/08/2024 0.0  0.0 - 0.2 % Final   Performed at [facility], [address] [place], [place] [address]   Glucose-Capillary 05/17/2024 222 (H)  70 - 99 mg/dL Final   Glucose reference range applies only to samples taken after fasting for at least 8 hours.   Comment 1 05/17/2024 Notify RN   Final   Glucose-Capillary 05/17/2024 193 (H)  70 - 99 mg/dL Final   Glucose reference range applies only to samples taken after fasting for at least 8 hours.   Glucose-Capillary 05/17/2024 192 (H)  70 - 99 mg/dL Final   Glucose reference range applies only to samples taken after fasting for  at least 8 hours.   Glucose-Capillary 05/17/2024 199 (H)  70 - 99 mg/dL Final   Glucose reference range applies only to samples taken after fasting for at least 8 hours.   Glucose-Capillary 05/17/2024 227 (H)  70 - 99 mg/dL Final   Glucose reference range applies only to samples taken after fasting for at least 8 hours.   WBC 05/18/2024 14.3 (H)  4.0 - 10.5 K/uL Final   RBC 05/18/2024 4.10  3.87 - 5.11 MIL/uL Final   Hemoglobin 05/18/2024 13.0  12.0 - 15.0 g/dL Final   HCT 98/70/7973 39.7  36.0 - 46.0 % Final   MCV 05/18/2024 96.8  80.0 - 100.0 fL Final   MCH 05/18/2024 31.7  26.0 - 34.0 pg Final   MCHC 05/18/2024 32.7  30.0 - 36.0 g/dL Final   RDW 98/70/7973 12.5  11.5 - 15.5 % Final   Platelets 05/18/2024 190  150 - 400 K/uL Final   nRBC 05/18/2024 0.0  0.0 - 0.2 % Final   Performed at [facility], [address], [place], [place] [address]   Sodium 05/18/2024 133 (L)  135 - 145 mmol/L Final   Potassium 05/18/2024 4.7  3.5 - 5.1 mmol/L Final   Chloride 05/18/2024 98  98 - 111 mmol/L Final   CO2 05/18/2024 25  22 - 32 mmol/L Final   Glucose, Bld 05/18/2024 269 (H)  70 - 99 mg/dL Final   Glucose reference range applies only to samples taken after fasting for at least 8 hours.   BUN 05/18/2024 11  8 - 23 mg/dL Final   Creatinine, Ser 05/18/2024 0.76  0.44 - 1.00 mg/dL Final   Calcium  05/18/2024 9.1  8.9 - 10.3 mg/dL Final   GFR, Estimated 05/18/2024 >60  >60 mL/min Final   Comment: (NOTE) Calculated using the CKD-EPI Creatinine Equation (2021)    Anion gap 05/18/2024 10  5 - 15 Final   Performed at [facility], [address], [place], [place] [address]   Glucose-Capillary 05/17/2024 284 (H)  70 - 99 mg/dL Final   Glucose reference range applies only to samples taken after  fasting for at least 8 hours.   Comment 1 05/17/2024 Notify RN   Final   Glucose-Capillary 05/18/2024 321 (H)  70 - 99 mg/dL Final   Glucose reference range applies only to  samples taken after fasting for at least 8 hours.   Hgb A1c MFr Bld 05/18/2024 6.7 (H)  4.8 - 5.6 % Final   Comment: (NOTE) Diagnosis of Diabetes The following HbA1c ranges recommended by the American Diabetes Association (ADA) may be used as an aid in the diagnosis of diabetes mellitus.  Hemoglobin             Suggested A1C NGSP%              Diagnosis  <5.7                   Non Diabetic  5.7-6.4                Pre-Diabetic  >6.4                   Diabetic  <7.0                   Glycemic control for                       adults with diabetes.     Mean Plasma Glucose 05/18/2024 145.59  mg/dL Final   Performed at [facility], [address], [place], [place] [address]   Glucose-Capillary 05/18/2024 280 (H)  70 - 99 mg/dL Final   Glucose reference range applies only to samples taken after fasting for at least 8 hours.   Glucose-Capillary 05/18/2024 385 (H)  70 - 99 mg/dL Final   Glucose reference range applies only to samples taken after fasting for at least 8 hours.   WBC 05/19/2024 14.6 (H)  4.0 - 10.5 K/uL Final   RBC 05/19/2024 3.93  3.87 - 5.11 MIL/uL Final   Hemoglobin 05/19/2024 12.3  12.0 - 15.0 g/dL Final   HCT 98/69/7973 38.4  36.0 - 46.0 % Final   MCV 05/19/2024 97.7  80.0 - 100.0 fL Final   MCH 05/19/2024 31.3  26.0 - 34.0 pg Final   MCHC 05/19/2024 32.0  30.0 - 36.0 g/dL Final   RDW 98/69/7973 12.8  11.5 - 15.5 % Final   Platelets 05/19/2024 177  150 - 400 K/uL Final   nRBC 05/19/2024 0.0  0.0 - 0.2 % Final   Performed at [facility], [address], [place], [place] [address]   Sodium 05/19/2024 135  135 - 145 mmol/L Final   Potassium 05/19/2024 4.3  3.5 - 5.1 mmol/L Final   Chloride 05/19/2024 99  98 - 111 mmol/L Final   CO2 05/19/2024 26  22 - 32 mmol/L Final   Glucose, Bld 05/19/2024 274 (H)  70 - 99 mg/dL Final   Glucose reference range applies only to samples taken after fasting for at least 8 hours.   BUN 05/19/2024 13  8 - 23  mg/dL Final   Creatinine, Ser 05/19/2024 0.70  0.44 - 1.00 mg/dL Final   Calcium  05/19/2024 9.1  8.9 - 10.3 mg/dL Final   GFR, Estimated 05/19/2024 >60  >60 mL/min Final   Comment: (NOTE) Calculated using the CKD-EPI Creatinine Equation (2021)    Anion gap 05/19/2024 10  5 - 15 Final   Performed at [facility], [address], [place], [place] [address]  Glucose-Capillary 05/18/2024 313 (H)  70 - 99 mg/dL Final   Glucose reference range applies only to samples taken after fasting for at least 8 hours.   Glucose-Capillary 05/19/2024 273 (H)  70 - 99 mg/dL Final   Glucose reference range applies only to samples taken after fasting for at least 8 hours.   Glucose-Capillary 05/19/2024 300 (H)  70 - 99 mg/dL Final   Glucose reference range applies only to samples taken after fasting for at least 8 hours.  Hospital Outpatient Visit on 05/08/2024  Component Date Value Ref Range Status   Glucose-Capillary 05/08/2024 148 (H)  70 - 99 mg/dL Final   Glucose reference range applies only to samples taken after fasting for at least 8 hours.  Admission on 05/03/2024, Discharged on 05/03/2024  Component Date Value Ref Range Status   Sodium 05/03/2024 141  135 - 145 mmol/L Final   Potassium 05/03/2024 4.3  3.5 - 5.1 mmol/L Final   Chloride 05/03/2024 104  98 - 111 mmol/L Final   BUN 05/03/2024 17  8 - 23 mg/dL Final   Creatinine, Ser 05/03/2024 0.70  0.44 - 1.00 mg/dL Final   Glucose, Bld 98/85/7973 142 (H)  70 - 99 mg/dL Final   Glucose reference range applies only to samples taken after fasting for at least 8 hours.   Calcium , Ion 05/03/2024 1.17  1.15 - 1.40 mmol/L Final   TCO2 05/03/2024 25  22 - 32 mmol/L Final   Hemoglobin 05/03/2024 15.0  12.0 - 15.0 g/dL Final   HCT 98/85/7973 44.0  36.0 - 46.0 % Final   Glucose-Capillary 05/03/2024 116 (H)  70 - 99 mg/dL Final   Glucose reference range applies only to samples taken after fasting for at least 8 hours.     X-Rays:DG  Knee 1-2 Views Left Result Date: 05/17/2024 EXAM: 1 or 2 VIEW(S) XRAY OF THE LEFT KNEE 05/17/2024 03:00:00 PM COMPARISON: 12/03/2023 CLINICAL HISTORY: Status post total knee replacement using cement. FINDINGS: BONES AND JOINTS: Left total knee arthroplasty noted. The patellofemoral component is present. The components appear well seated. No acute fracture. No malalignment. No significant joint effusion. SOFT TISSUES: Anterior skin staples noted. Gas in soft tissues consistent with recent surgery. IMPRESSION: 1. Left total knee arthroplasty with well-seated components. 2. Gas in soft tissues consistent with recent surgery. Electronically signed by: Elsie Gravely MD 05/17/2024 04:08 PM EST RP Workstation: [id number]   PERIPHERAL VASCULAR CATHETERIZATION Result Date: 05/03/2024 Table formatting from the original result was not included. Images from the original result were not included. NAME: Caroline [name]    MRN: [id number] DOB: [birth year]    DATE OF OPERATION: 05/03/2024 PREOP DIAGNOSIS:  Right lower extremity DVT with high risk of developing another blood clot needing pause and anticoagulation for surgery POSTOP DIAGNOSIS:  Same PROCEDURE:  Ultrasound-guided micropuncture access of the right common femoral vein and antegrade fashion Cavogram Cook Celect inferior vena cava filter placement SURGEON: Fonda FORBES Rim ASSIST: None ANESTHESIA: Moderate-fentanyl  and Versed   23 minutes sedation, 25 mL contrast INDICATIONS:  Caroline [name] is a 69 y.o. 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  Patient is scheduled for knee replacement.  She presents today for IVC filter placement as she will be off of anticoagulation for her knee replacement. FINDINGS: Patent bilateral renal veins No thrombus in the inferior vena cava TECHNIQUE: Patient was brought to the operating room and laid in the supine position.  General anesthesia was  induced and the  patient was prepped and draped in standard fashion.  The case began with ultrasound insonation of the right common femoral artery.  This was accessed using an ultrasound-guided micropuncture needle.  A wire was run into the superior vena cava.  Cavogram followed demonstrating no thrombus.  Caval was sufficient size for IVC filter placement.  The filter was brought onto the field, positioned in the patient, and deployed below the level of the renal veins.  There was 10 degrees of tilt.  I tried to remove it from the wall, however was unsuccessful with the balloon.  The hook is facing the center of the IVC, therefore it should still be retrievable. Wires and sheath were removed.  Patient was taken the PACU in stable condition. Fonda FORBES Rim, MD [facility] DATE OF DICTATION:   05/03/2024    EKG: Orders placed or performed during the hospital encounter of 05/03/24   EKG 12-Lead   EKG 12-Lead     Hospital Course: ROSEMARY MOSSBARGER is a 69 y.o. who was admitted to [facility]. They were brought to the operating room on 05/17/2024 and underwent Procedures: ARTHROPLASTY, KNEE, TOTAL.  Patient tolerated the procedure well and was later transferred to the recovery room and then to the orthopaedic floor for postoperative care.  They were given PO and IV analgesics for pain control following their surgery.  They were given 24 hours of postoperative antibiotics of  Anti-infectives (From admission, onward)    Start     Dose/Rate Route Frequency Ordered Stop   05/18/24 1000  valACYclovir  (VALTREX ) tablet 500 mg        500 mg Oral Daily 05/17/24 1558     05/17/24 1730  ceFAZolin  (ANCEF ) IVPB 2g/100 mL premix        2 g 200 mL/hr over 30 Minutes Intravenous Every 6 hours 05/17/24 1558 05/18/24 0020   05/17/24 0830  ceFAZolin  (ANCEF ) IVPB 2g/100 mL premix        2 g 200 mL/hr over 30 Minutes Intravenous On call to O.R. 05/17/24 0820 05/17/24 1143      and started on DVT  prophylaxis in the form of TED hose and Eliquis , SCDs.   PT and OT were ordered for total joint protocol.  Discharge planning consulted to help with postop disposition and equipment needs.  Patient had a difficult night on the evening of surgery.  They started to get up OOB with therapy on day one. Continued to work with therapy into day two.  By day two, the patient had progressed with therapy and meeting their goals.  Incision was healing well.  Patient was seen in rounds and was ready to go home.   Diet: Diabetic diet Activity:WBAT Follow-up:in 10-14 days Disposition - Home Discharged Condition: good   Discharge Instructions     Call MD / Call 911   Complete by: As directed    If you experience chest pain or shortness of breath, CALL 911 and be transported to the hospital emergency room.  If you develope a fever above 101 F, pus (white drainage) or increased drainage or redness at the wound, or calf pain, call your surgeon's office.   Constipation Prevention   Complete by: As directed    Drink plenty of fluids.  Prune juice may be helpful.  You may use a stool softener, such as Colace (over the counter) 100 mg twice a day.  Use MiraLax  (over the counter) for constipation as  needed.   Increase activity slowly as tolerated   Complete by: As directed    Post-operative opioid taper instructions:   Complete by: As directed    POST-OPERATIVE OPIOID TAPER INSTRUCTIONS: It is important to wean off of your opioid medication as soon as possible. If you do not need pain medication after your surgery it is ok to stop day one. Opioids include: Codeine, Hydrocodone (Norco, Vicodin), Oxycodone (Percocet, oxycontin ) and hydromorphone  amongst others.  Long term and even short term use of opiods can cause: Increased pain response Dependence Constipation Depression Respiratory depression And more.  Withdrawal symptoms can include Flu like symptoms Nausea, vomiting And more Techniques to manage  these symptoms Hydrate well Eat regular healthy meals Stay active Use relaxation techniques(deep breathing, meditating, yoga) Do Not substitute Alcohol to help with tapering If you have been on opioids for less than two weeks and do not have pain than it is ok to stop all together.  Plan to wean off of opioids This plan should start within one week post op of your joint replacement. Maintain the same interval or time between taking each dose and first decrease the dose.  Cut the total daily intake of opioids by one tablet each day Next start to increase the time between doses. The last dose that should be eliminated is the evening dose.         Allergies as of 05/19/2024       Reactions   Amoxicillin Rash   Azithromycin Rash   Homatropine Rash   Hydrocodone  Bit-homatrop Mbr Rash   Penicillin G Rash   Dyspepsia, ingestion   Tape Rash        Medication List     TAKE these medications    apixaban  2.5 MG Tabs tablet Commonly known as: ELIQUIS  Take 2.5 mg by mouth 2 (two) times daily.   atorvastatin  40 MG tablet Commonly known as: LIPITOR Take 40 mg by mouth at bedtime.   Culturelle Probiotic + Prebiot Chew Chew 2 each by mouth in the morning.   cycloSPORINE  0.05 % ophthalmic emulsion Commonly known as: RESTASIS  Place 1 drop into both eyes 2 (two) times daily.   docusate sodium  100 MG capsule Commonly known as: Colace Take 1 capsule (100 mg total) by mouth 2 (two) times daily. What changed:  medication strength how much to take when to take this   famotidine  40 MG tablet Commonly known as: PEPCID  Take 40 mg by mouth at bedtime.   fexofenadine 180 MG tablet Commonly known as: ALLEGRA Take 180 mg by mouth in the morning.   fluticasone  50 MCG/ACT nasal spray Commonly known as: FLONASE  Place 2 sprays into both nostrils in the morning.   glipiZIDE  5 MG tablet Commonly known as: GLUCOTROL  Take 1 tablet (5 mg total) by mouth daily as needed (glucose >  250).   HAIR SKIN & NAILS GUMMIES PO Take 2 each by mouth in the morning.   losartan  25 MG tablet Commonly known as: COZAAR  Take 25 mg by mouth daily.   Mounjaro 12.5 MG/0.5ML Pen Generic drug: tirzepatide Inject 12.5 mg into the skin every Sunday.   multivitamin with minerals Tabs tablet Take 2 tablets by mouth in the morning.   NIFEdipine  30 MG 24 hr tablet Commonly known as: PROCARDIA -XL/NIFEDICAL-XL Take 30 mg by mouth at bedtime.   omeprazole 40 MG capsule Commonly known as: PRILOSEC Take 40 mg by mouth in the morning.   OneTouch Verio Flex System w/Device Kit daily.   oxyCODONE  5 MG  immediate release tablet Commonly known as: Oxy IR/ROXICODONE  Take 1 tablet (5 mg total) by mouth every 4 (four) hours as needed for severe pain (pain score 7-10).   traMADol  50 MG tablet Commonly known as: ULTRAM  Take 50 mg by mouth daily as needed (pain.).   valACYclovir  500 MG tablet Commonly known as: VALTREX  Take 500 mg by mouth in the morning.   VITAMIN C  GUMMIES PO Take 2 each by mouth in the morning.   VITAMIN D -3 PO Take 1 tablet by mouth daily.   vitamin E  180 MG (400 UNITS) capsule Take 400 Units by mouth in the morning.        Follow-up Information     [facility] Follow up.   Specialty: Home Health Services Why: to provide home physical therapy visits Contact information: [address] [place] [address] [phone]                 Signed: Alper Guilmette, PA-C Orthopaedic Surgery 05/19/2024, 12:11 PM   "

## 2024-07-06 ENCOUNTER — Encounter: Admitting: Vascular Surgery

## 2025-02-13 ENCOUNTER — Ambulatory Visit: Admitting: Adult Health

## 2025-03-29 ENCOUNTER — Ambulatory Visit: Admitting: Neurology
